# Patient Record
Sex: Male | Born: 1945 | Race: White | Hispanic: No | Smoking: Current every day smoker
Health system: Southern US, Community
[De-identification: ages and names within clinical notes are randomized; demographics above are authoritative.]

## PROBLEM LIST (undated history)

## (undated) DIAGNOSIS — T7840XA Allergy, unspecified, initial encounter: Secondary | ICD-10-CM

## (undated) DIAGNOSIS — Z720 Tobacco use: Secondary | ICD-10-CM

## (undated) DIAGNOSIS — F419 Anxiety disorder, unspecified: Secondary | ICD-10-CM

## (undated) DIAGNOSIS — F329 Major depressive disorder, single episode, unspecified: Secondary | ICD-10-CM

## (undated) DIAGNOSIS — J449 Chronic obstructive pulmonary disease, unspecified: Secondary | ICD-10-CM

## (undated) DIAGNOSIS — I10 Essential (primary) hypertension: Secondary | ICD-10-CM

## (undated) DIAGNOSIS — E119 Type 2 diabetes mellitus without complications: Secondary | ICD-10-CM

## (undated) DIAGNOSIS — I729 Aneurysm of unspecified site: Secondary | ICD-10-CM

## (undated) DIAGNOSIS — F32A Depression, unspecified: Secondary | ICD-10-CM

## (undated) DIAGNOSIS — E785 Hyperlipidemia, unspecified: Secondary | ICD-10-CM

## (undated) HISTORY — DX: Allergy, unspecified, initial encounter: T78.40XA

## (undated) HISTORY — DX: Hyperlipidemia, unspecified: E78.5

## (undated) HISTORY — DX: Anxiety disorder, unspecified: F41.9

## (undated) HISTORY — PX: OTHER SURGICAL HISTORY: SHX169

## (undated) HISTORY — PX: LUMBAR LAMINECTOMY: SHX95

## (undated) HISTORY — DX: Tobacco use: Z72.0

## (undated) HISTORY — DX: Chronic obstructive pulmonary disease, unspecified: J44.9

## (undated) HISTORY — DX: Depression, unspecified: F32.A

## (undated) HISTORY — PX: FOOT SURGERY: SHX648

## (undated) HISTORY — DX: Essential (primary) hypertension: I10

## (undated) HISTORY — DX: Aneurysm of unspecified site: I72.9

## (undated) HISTORY — DX: Major depressive disorder, single episode, unspecified: F32.9

## (undated) HISTORY — PX: LUMBAR DISC SURGERY: SHX700

## (undated) HISTORY — DX: Type 2 diabetes mellitus without complications: E11.9

## (undated) HISTORY — PX: COLONOSCOPY: SHX174

---

## 1997-12-14 ENCOUNTER — Encounter: Admission: RE | Admit: 1997-12-14 | Discharge: 1998-03-14 | Payer: Self-pay | Admitting: Family Medicine

## 1997-12-22 ENCOUNTER — Ambulatory Visit (HOSPITAL_COMMUNITY): Admission: RE | Admit: 1997-12-22 | Discharge: 1997-12-22 | Payer: Self-pay | Admitting: Family Medicine

## 1998-02-09 ENCOUNTER — Ambulatory Visit (HOSPITAL_COMMUNITY): Admission: RE | Admit: 1998-02-09 | Discharge: 1998-02-09 | Payer: Self-pay | Admitting: Neurosurgery

## 2002-10-19 ENCOUNTER — Encounter: Payer: Self-pay | Admitting: Family Medicine

## 2002-10-19 ENCOUNTER — Encounter: Admission: RE | Admit: 2002-10-19 | Discharge: 2002-10-19 | Payer: Self-pay | Admitting: Family Medicine

## 2004-01-24 ENCOUNTER — Encounter: Admission: RE | Admit: 2004-01-24 | Discharge: 2004-01-24 | Payer: Self-pay | Admitting: Family Medicine

## 2004-02-07 ENCOUNTER — Ambulatory Visit (HOSPITAL_COMMUNITY): Admission: RE | Admit: 2004-02-07 | Discharge: 2004-02-07 | Payer: Self-pay | Admitting: Cardiology

## 2004-11-07 ENCOUNTER — Ambulatory Visit: Payer: Self-pay | Admitting: Cardiology

## 2004-11-08 ENCOUNTER — Ambulatory Visit: Payer: Self-pay | Admitting: Cardiology

## 2005-01-15 ENCOUNTER — Ambulatory Visit: Payer: Self-pay | Admitting: Cardiology

## 2005-06-04 ENCOUNTER — Ambulatory Visit: Payer: Self-pay | Admitting: Cardiology

## 2005-10-23 ENCOUNTER — Ambulatory Visit: Payer: Self-pay | Admitting: Family Medicine

## 2005-11-18 ENCOUNTER — Ambulatory Visit: Payer: Self-pay | Admitting: Cardiology

## 2005-11-20 ENCOUNTER — Ambulatory Visit: Payer: Self-pay | Admitting: Cardiology

## 2005-12-15 ENCOUNTER — Ambulatory Visit: Payer: Self-pay | Admitting: Family Medicine

## 2005-12-23 ENCOUNTER — Ambulatory Visit: Payer: Self-pay | Admitting: Family Medicine

## 2006-11-02 ENCOUNTER — Ambulatory Visit: Payer: Self-pay | Admitting: Cardiology

## 2006-11-23 ENCOUNTER — Ambulatory Visit: Payer: Self-pay | Admitting: Family Medicine

## 2007-12-09 ENCOUNTER — Ambulatory Visit: Payer: Self-pay | Admitting: Family Medicine

## 2007-12-09 DIAGNOSIS — J309 Allergic rhinitis, unspecified: Secondary | ICD-10-CM | POA: Insufficient documentation

## 2007-12-09 DIAGNOSIS — E7800 Pure hypercholesterolemia, unspecified: Secondary | ICD-10-CM | POA: Insufficient documentation

## 2007-12-09 DIAGNOSIS — I10 Essential (primary) hypertension: Secondary | ICD-10-CM | POA: Insufficient documentation

## 2008-01-12 ENCOUNTER — Ambulatory Visit: Payer: Self-pay | Admitting: Family Medicine

## 2008-01-12 LAB — CONVERTED CEMR LAB
AST: 20 units/L (ref 0–37)
Basophils Absolute: 0 10*3/uL (ref 0.0–0.1)
Basophils Relative: 0.3 % (ref 0.0–1.0)
Bilirubin Urine: NEGATIVE
Chloride: 103 meq/L (ref 96–112)
Cholesterol: 181 mg/dL (ref 0–200)
Creatinine, Ser: 1 mg/dL (ref 0.4–1.5)
Eosinophils Absolute: 0.1 10*3/uL (ref 0.0–0.7)
GFR calc Af Amer: 98 mL/min
GFR calc non Af Amer: 81 mL/min
Glucose, Urine, Semiquant: NEGATIVE
HCT: 44.2 % (ref 39.0–52.0)
HDL: 46.7 mg/dL (ref >39.0)
MCHC: 34.8 g/dL (ref 30.0–36.0)
MCV: 97.4 fL (ref 78.0–100.0)
Monocytes Absolute: 0.6 10*3/uL (ref 0.1–1.0)
Neutrophils Relative %: 50.5 % (ref 43.0–77.0)
PSA: 0.55 ng/mL (ref 0.10–4.00)
Platelets: 175 10*3/uL (ref 150–400)
Protein, U semiquant: NEGATIVE
RBC: 4.54 M/uL (ref 4.22–5.81)
TSH: 0.58 microintl units/mL (ref 0.35–5.50)
Total Bilirubin: 1.2 mg/dL (ref 0.3–1.2)
VLDL: 39 mg/dL (ref 0–40)
pH: 5

## 2008-01-19 ENCOUNTER — Ambulatory Visit: Payer: Self-pay | Admitting: Family Medicine

## 2008-01-19 ENCOUNTER — Telehealth: Payer: Self-pay | Admitting: Family Medicine

## 2008-01-19 DIAGNOSIS — F17200 Nicotine dependence, unspecified, uncomplicated: Secondary | ICD-10-CM | POA: Insufficient documentation

## 2008-01-19 DIAGNOSIS — F411 Generalized anxiety disorder: Secondary | ICD-10-CM | POA: Insufficient documentation

## 2008-01-19 DIAGNOSIS — F418 Other specified anxiety disorders: Secondary | ICD-10-CM | POA: Insufficient documentation

## 2008-04-26 ENCOUNTER — Telehealth: Payer: Self-pay | Admitting: Family Medicine

## 2008-05-18 ENCOUNTER — Telehealth: Payer: Self-pay | Admitting: Family Medicine

## 2008-12-06 ENCOUNTER — Telehealth: Payer: Self-pay | Admitting: Family Medicine

## 2009-02-26 ENCOUNTER — Telehealth: Payer: Self-pay | Admitting: Family Medicine

## 2009-04-02 ENCOUNTER — Ambulatory Visit: Payer: Self-pay | Admitting: Family Medicine

## 2009-04-02 ENCOUNTER — Telehealth: Payer: Self-pay | Admitting: Family Medicine

## 2009-04-02 LAB — CONVERTED CEMR LAB
AST: 26 units/L (ref 0–37)
Albumin: 4.2 g/dL (ref 3.5–5.2)
Alkaline Phosphatase: 49 units/L (ref 39–117)
BUN: 16 mg/dL (ref 6–23)
Basophils Absolute: 0 10*3/uL (ref 0.0–0.1)
CO2: 34 meq/L — ABNORMAL HIGH (ref 19–32)
Calcium: 9.4 mg/dL (ref 8.4–10.5)
Cholesterol: 198 mg/dL (ref 0–200)
Creatinine, Ser: 0.9 mg/dL (ref 0.4–1.5)
Direct LDL: 105.8 mg/dL
Eosinophils Absolute: 0.1 10*3/uL (ref 0.0–0.7)
GFR calc non Af Amer: 90.61 mL/min (ref >60)
Glucose, Bld: 98 mg/dL (ref 70–99)
HDL: 54.1 mg/dL (ref >39.00)
Lymphocytes Relative: 26.8 % (ref 12.0–46.0)
MCHC: 34.5 g/dL (ref 30.0–36.0)
Monocytes Relative: 8.7 % (ref 3.0–12.0)
Neutrophils Relative %: 63.1 % (ref 43.0–77.0)
Platelets: 175 10*3/uL (ref 150.0–400.0)
RDW: 12.8 % (ref 11.5–14.6)
Sodium: 142 meq/L (ref 135–145)
TSH: 0.49 microintl units/mL (ref 0.35–5.50)
Total Bilirubin: 0.9 mg/dL (ref 0.3–1.2)
Triglycerides: 266 mg/dL — ABNORMAL HIGH (ref 0.0–149.0)

## 2009-04-19 ENCOUNTER — Ambulatory Visit: Payer: Self-pay | Admitting: Family Medicine

## 2009-04-26 ENCOUNTER — Ambulatory Visit: Payer: Self-pay | Admitting: Family Medicine

## 2009-04-30 ENCOUNTER — Ambulatory Visit: Payer: Self-pay | Admitting: Internal Medicine

## 2009-05-22 ENCOUNTER — Telehealth: Payer: Self-pay | Admitting: Family Medicine

## 2009-08-27 ENCOUNTER — Telehealth: Payer: Self-pay | Admitting: Family Medicine

## 2009-10-08 ENCOUNTER — Telehealth: Payer: Self-pay | Admitting: Family Medicine

## 2009-10-12 ENCOUNTER — Telehealth: Payer: Self-pay | Admitting: Family Medicine

## 2010-02-25 ENCOUNTER — Telehealth: Payer: Self-pay | Admitting: Family Medicine

## 2010-04-22 ENCOUNTER — Ambulatory Visit: Payer: Self-pay | Admitting: Family Medicine

## 2010-04-22 ENCOUNTER — Encounter: Payer: Self-pay | Admitting: Family Medicine

## 2010-04-22 DIAGNOSIS — J449 Chronic obstructive pulmonary disease, unspecified: Secondary | ICD-10-CM | POA: Insufficient documentation

## 2010-04-22 LAB — CONVERTED CEMR LAB
AST: 27 units/L (ref 0–37)
Albumin: 4.4 g/dL (ref 3.5–5.2)
BUN: 16 mg/dL (ref 6–23)
Basophils Absolute: 0 10*3/uL (ref 0.0–0.1)
Bilirubin Urine: NEGATIVE
Blood in Urine, dipstick: NEGATIVE
CO2: 33 meq/L — ABNORMAL HIGH (ref 19–32)
Cholesterol: 191 mg/dL (ref 0–200)
Eosinophils Absolute: 0.1 10*3/uL (ref 0.0–0.7)
Glucose, Bld: 142 mg/dL — ABNORMAL HIGH (ref 70–99)
Glucose, Urine, Semiquant: NEGATIVE
HCT: 46.4 % (ref 39.0–52.0)
Hemoglobin: 15.8 g/dL (ref 13.0–17.0)
Lymphs Abs: 2.6 10*3/uL (ref 0.7–4.0)
MCHC: 34.2 g/dL (ref 30.0–36.0)
MCV: 99.8 fL (ref 78.0–100.0)
Monocytes Absolute: 0.7 10*3/uL (ref 0.1–1.0)
Neutro Abs: 4.2 10*3/uL (ref 1.4–7.7)
PSA: 0.63 ng/mL (ref 0.10–4.00)
Platelets: 180 10*3/uL (ref 150.0–400.0)
Potassium: 4.7 meq/L (ref 3.5–5.1)
Protein, U semiquant: NEGATIVE
RDW: 13.3 % (ref 11.5–14.6)
Sodium: 142 meq/L (ref 135–145)
Specific Gravity, Urine: 1.02
TSH: 0.6 microintl units/mL (ref 0.35–5.50)
Total Bilirubin: 0.9 mg/dL (ref 0.3–1.2)
VLDL: 36.2 mg/dL (ref 0.0–40.0)
WBC Urine, dipstick: NEGATIVE
pH: 5

## 2010-08-06 NOTE — Miscellaneous (Signed)
Summary: Controlled Substances Contract  Controlled Substances Contract   Imported By: Maryln Gottron 04/23/2010 14:34:18  _____________________________________________________________________  External Attachment:    Type:   Image     Comment:   External Document

## 2010-08-06 NOTE — Progress Notes (Signed)
Summary: REQ FOR REFILL  Phone Note Call from Patient   Caller: Patient [phone] Reason for Call: Refill Medication Summary of Call: Pt called in to req a [facility]ill of [facility]cod[facility] [address] Pt would also like to see if dosage could be increased to twice a day, pt adv that he has been taking  2 instead of one when he gets up in the am because he can hardly get out of bed and it seems to be working well...Marland KitchenMarland KitchenPt was adv that he may need to come in for an OV for dosage increase but req would be passed along.  Initial call taken by: Debbra Riding,  August 27, 2009 9:11 AM  Follow-up for Phone Call        if his pain is getting worse.  We need to have him go see the neurosurgeon for evaluation.  Okay to prescribe 60 Vicodin tabs directions one b.i.d. no refills.  If he has a Midwife.  He can call himself if not, we will set him up an appointment Follow-up by: Roderick Pee MD,  August 27, 2009 9:51 AM  Additional Follow-up for Phone Call Additional follow up Details #1::        Phone Call Completed, Rx Called In Additional Follow-up by: Kern Reap CMA Duncan Dull),  August 27, 2009 10:01 AM    New/Updated Medications: VICODIN ES 7.5-750 MG TABS (HYDROCODONE-ACETAMINOPHEN) Take 1 tablet by mouth two times a day Prescriptions: VICODIN ES 7.5-750 MG TABS (HYDROCODONE-ACETAMINOPHEN) Take 1 tablet by mouth two times a day  #60 x 0   Entered by:   Kern Reap CMA (AAMA)   Authorized by:   Roderick Pee MD   Signed by:   Kern Reap CMA (AAMA) on 08/27/2009   Method used:   Telephoned to ...       [facility] [address] [facility]retail[facility]       [address]       [place], [place]  [address]       Ph: [phone] or [phone]       Fax: [phone]   RxID:   [id number]

## 2010-08-06 NOTE — Progress Notes (Signed)
Summary: diazepam  Phone Note From Pharmacy   Summary of Call: patient is requesting a refill of diazepam is this okay to fill? Initial call taken by: Kern Reap CMA Duncan Dull),  October 12, 2009 4:23 PM  Follow-up for Phone Call        Valium 5 mg, dispense 60 tablets, directions one p.o. b.i.d. p.r.n. refills x 4 Follow-up by: Roderick Pee MD,  October 12, 2009 4:38 PM  Additional Follow-up for Phone Call Additional follow up Details #1::        Pharmacist called Additional Follow-up by: Kern Reap CMA Duncan Dull),  October 12, 2009 5:21 PM

## 2010-08-06 NOTE — Assessment & Plan Note (Signed)
Summary: CPX (PT WILL COME IN FASTING) // RS   Vital Signs:  Patient profile:   65 year old male Height:      74 inches Weight:      252 pounds BMI:     32.47 Temp:     98.6 degrees F oral BP sitting:   140 / 88  (left arm) Cuff size:   regular  Vitals Entered By: Kern Reap CMA Duncan Dull) (April 22, 2010 8:38 AM)  Contraindications/Deferment of Procedures/Staging:    Test/Procedure: FLU VAX    Reason for deferment: patient declined  CC: cpx Is Patient Diabetic? No Pain Assessment Patient in pain? no        CC:  cpx.  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  For hyperlipidemia.  He takes simvastatin 40 mg daily will check lipid panel today.  For hypertension.  He takes Zestoretic 20 -- 25 daily.  BP 140/88.  For depression.  He takes Lexapro 20 mg nightly.  He also takes an aspirin tablet and 5 mg of Valium p.r.n.  He takes Vicodin one half tab b.i.d. daily for chronic back pain.  He also continues to smoke.  He took the chantix for a month and then quit.  I insisted he start back on the chantix again.  He does not get his eyes checked routinely however, he does get regular dental care.  He is due for follow-up colonoscopy, but refuses to go back because this because of the cost $1300 out-of-pocket.  He declines the flu shot tetanus visit 2006  Allergies: No Known Drug Allergies  Past History:  Past medical, surgical, family and social histories (including risk factors) reviewed, and no changes noted (except as noted below).  Past Medical History: Reviewed history from 01/19/2008 and no changes required. 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 Family History Depression Family History Hypertension  Social History: Reviewed history from 12/09/2007 and no changes required. Occupation: Single Current Smoker Alcohol use-yes Drug use-no Regular exercise-yes  Review of Systems      See HPI  Physical Exam  General:  Well-developed,well-nourished,in no acute distress; alert,appropriate and cooperative throughout examination Head:  Normocephalic and atraumatic without obvious abnormalities. No apparent alopecia or balding. Eyes:  No corneal or conjunctival inflammation noted. EOMI. Perrla. Funduscopic exam benign, without hemorrhages, exudates or papilledema. Vision grossly normal. Ears:  External ear exam shows no significant lesions or deformities.  Otoscopic examination reveals clear canals, tympanic membranes are intact bilaterally without bulging, retraction, inflammation or discharge. Hearing is grossly normal bilaterally. Nose:  External nasal examination shows no deformity or inflammation. Nasal mucosa are pink and moist without lesions or exudates. Mouth:  Oral mucosa and oropharynx without lesions or exudates.  Teeth in good repair. Neck:  No deformities, masses, or tenderness noted. Chest Wall:  No deformities, masses, tenderness or gynecomastia noted. Breasts:  No masses or gynecomastia noted Lungs:  Normal respiratory effort, chest expands symmetrically. Lungs are clear to auscultation, no crackles or wheezes. Heart:  heart sounds barely audible.  No murmurs Abdomen:  Bowel sounds positive,abdomen soft and non-tender  without masses, organomegaly or hernias noted. Rectal:  No external abnormalities noted. Normal sphincter tone. No rectal masses or tenderness. Genitalia:  Testes bilaterally descended without nodularity, tenderness or masses. No scrotal masses  or lesions. No penis lesions or urethral discharge. Prostate:  Prostate gland firm and smooth, no enlargement, nodularity, tenderness, mass, asymmetry or induration. Msk:  No deformity or scoliosis noted of thoracic or lumbar spine.   Pulses:  R and L carotid,radial,femoral,dorsalis pedis and posterior tibial pulses are full and equal bilaterally Extremities:  No clubbing, cyanosis, edema, or deformity noted with normal full range of motion of all joints.   Neurologic:  No cranial nerve deficits noted. Station and gait are normal. Plantar reflexes are down-going bilaterally. DTRs are symmetrical throughout. Sensory, motor and coordinative functions appear intact. Skin:  Intact without suspicious lesions or rashes Cervical Nodes:  No lymphadenopathy noted Axillary Nodes:  No palpable lymphadenopathy Inguinal Nodes:  No significant adenopathy Psych:  Cognition and judgment appear intact. Alert and cooperative with normal attention span and concentration. No apparent delusions, illusions, hallucinations   Impression & Recommendations:  Problem # 1:  TOBACCO ABUSE (ICD-305.1) Assessment Unchanged  The following medications were removed from the medication list:    Chantix Starting Month Pak 0.5 Mg X 11 & 1 Mg X 42 Tabs (Varenicline tartrate) ..... Uad His updated medication list for this problem includes:    Chantix Continuing Month Pak 1 Mg Tabs (Varenicline tartrate) ..... Uad  Orders: Venipuncture (60454) TLB-Lipid Panel (80061-LIPID) TLB-BMP (Basic Metabolic Panel-BMET) (80048-METABOL) TLB-CBC Platelet - w/Differential (85025-CBCD) TLB-Hepatic/Liver Function Pnl (80076-HEPATIC) TLB-TSH (Thyroid Stimulating Hormone) (84443-TSH) TLB-PSA (Prostate Specific Antigen) (84153-PSA) Prescription Created Electronically [phone]) UA Dipstick w/o Micro (automated)  (81003) Spirometry w/Graph (94010)  Problem # 2:  DEPRESSION (ICD-311) Assessment: Improved  His updated medication list for this  problem includes:    Lexapro 20 Mg Tabs (Escitalopram oxalate) .Marland Kitchen... 1 tab @ bedtime    Valium 5 Mg Tabs (Diazepam) .Marland Kitchen... 1 po bid .  Orders: Venipuncture (91478) TLB-Lipid Panel (80061-LIPID) TLB-BMP (Basic Metabolic Panel-BMET) (80048-METABOL) TLB-CBC Platelet - w/Differential (85025-CBCD) TLB-Hepatic/Liver Function Pnl (80076-HEPATIC) TLB-TSH (Thyroid Stimulating Hormone) (84443-TSH) TLB-PSA (Prostate Specific Antigen) (84153-PSA) Prescription Created Electronically [phone]) UA Dipstick w/o Micro (automated)  (81003)  Problem # 3:  HYPERTENSION (ICD-401.9) Assessment: Improved  His updated medication list for this problem includes:    Lisinopril-hydrochlorothiazide 20-25 Mg Tabs (Lisinopril-hydrochlorothiazide) .Marland Kitchen... Take 1 tablet by mouth once qam; cpx when due  Orders: Venipuncture (13086) TLB-Lipid Panel (80061-LIPID) TLB-BMP (Basic Metabolic Panel-BMET) (80048-METABOL) TLB-CBC Platelet - w/Differential (85025-CBCD) TLB-Hepatic/Liver Function Pnl (80076-HEPATIC) TLB-TSH (Thyroid Stimulating Hormone) (84443-TSH) TLB-PSA (Prostate Specific Antigen) (84153-PSA) Prescription Created Electronically [phone]) UA Dipstick w/o Micro (automated)  (81003) EKG w/ Interpretation (93000)  Problem # 4:  HYPERLIPIDEMIA (ICD-272.4) Assessment: Improved  His updated medication list for this problem includes:    Simvastatin 40 Mg Tabs (Simvastatin) .Marland Kitchen... Take 1 tab by mouth at bedtime  cpx when due  Orders: Venipuncture (96295) TLB-Lipid Panel (80061-LIPID) TLB-BMP (Basic Metabolic Panel-BMET) (80048-METABOL) TLB-CBC Platelet - w/Differential (85025-CBCD) TLB-Hepatic/Liver Function Pnl (80076-HEPATIC) TLB-TSH (Thyroid Stimulating Hormone) (84443-TSH) TLB-PSA (Prostate Specific Antigen) (84153-PSA) Prescription Created Electronically [phone]) UA Dipstick w/o Micro (automated)  (81003) EKG w/ Interpretation (93000)  Problem # 5:  COPD (ICD-496) Assessment:  Deteriorated  Orders: Venipuncture (24401) TLB-Lipid Panel (80061-LIPID) TLB-BMP (Basic Metabolic Panel-BMET) (80048-METABOL) TLB-CBC Platelet - w/Differential (85025-CBCD) TLB-Hepatic/Liver Function Pnl (80076-HEPATIC) TLB-TSH (Thyroid Stimulating Hormone) (84443-TSH) TLB-PSA (Prostate Specific Antigen) (84153-PSA) Prescription Created Electronically [phone]) UA Dipstick w/o Micro (automated)  (  81003) Spirometry w/Graph (94010)  Problem # 6:  Preventive Health Care (ICD-V70.0) Assessment: Unchanged  Complete Medication List: 1)  Simvastatin 40 Mg Tabs (Simvastatin) .... Take 1 tab by mouth at bedtime  cpx when due 2)  Lisinopril-hydrochlorothiazide 20-25 Mg Tabs (Lisinopril-hydrochlorothiazide) .... Take 1 tablet by mouth once qam; cpx when due 3)  Lexapro 20 Mg Tabs (Escitalopram oxalate) .Marland Kitchen.. 1 tab @ bedtime 4)  Adult Aspirin Low Strength 81 Mg Tbdp (Aspirin) .... Once daily 5)  Valium 5 Mg Tabs (Diazepam) .Marland Kitchen.. 1 po bid . 6)  Flexeril 10 Mg Tabs (Cyclobenzaprine hcl) .... Take 1 tablet by mouth three times a day 7)  Vicodin Es 7.5-750 Mg Tabs (Hydrocodone-acetaminophen) .... Take 1 tablet by mouth two times a day 8)  Chantix Continuing Month Pak 1 Mg Tabs (Varenicline tartrate) .... Uad  Patient Instructions: 1)  he must stop smoking completely began the chantix one half tablet daily and begin to taper the cigarettes by two week and to stop completely. 2)  Follow-up in 6 weeks. 3)  Continue her other medications. 4)  Dr. Gweneth Dimitri, I would recommend for an eye exam. 5)  We will also have he signed a pain contract since it appears to be taking the pain medicine on a regular basis. Prescriptions: VALIUM 5 MG TABS (DIAZEPAM) 1 po bid .  #60 x 3   Entered and Authorized by:   Roderick Pee MD   Signed by:   Roderick Pee MD on 04/22/2010   Method used:   Print then Give to Patient   RxID:   [id number] VICODIN ES 7.5-750 MG TABS (HYDROCODONE-ACETAMINOPHEN) Take 1 tablet by  mouth two times a day  #120 x 6   Entered and Authorized by:   Roderick Pee MD   Signed by:   Roderick Pee MD on 04/22/2010   Method used:   Print then Give to Patient   RxID:   [id number] CHANTIX CONTINUING MONTH PAK 1 MG TABS (VARENICLINE TARTRATE) UAD  #1 x 3   Entered and Authorized by:   Roderick Pee MD   Signed by:   Roderick Pee MD on 04/22/2010   Method used:   [facility] to        [facility] [phone][facility] (retail[facility]       [address]       [place], [place]  [address]       Ph: [phone] or [phone]       Fax: [phone]   RxID:   [id number] LEXAPRO 20 MG  TABS (ESCITALOPRAM OXALATE) 1 tab @ bedtime  #100 Tablet x 3   Entered and Authorized by:   Roderick Pee MD   Signed by:   Roderick Pee MD on 04/22/2010   Method used:   Electronically to        [facility] [phone]* (retail)       [address]       [place], [place]  [address]       Ph: [phone] or [phone]       Fax: [phone]   RxID:   [id number] LISINOPRIL-HYDROCHLOROTHIAZIDE 20-25 MG  TABS (LISINOPRIL-HYDROCHLOROTHIAZIDE) Take 1 tablet by mouth once qam; cpx when due  #100 x 3   Entered and Authorized by:   Roderick Pee MD   Signed by:   Roderick Pee MD on 04/22/2010   Method used:   [facility]ronically to  [facility] [phone][facility] (retail)       [address]       [place], [place]  [address]       Ph: [phone] or [phone]       Fax: [phone]   RxID:   [id number] SIMVASTATIN 40 MG  TABS (SIMVASTATIN) Take 1 tab by mouth at bedtime  cpx when due  #100 Tablet x 3   Entered and Authorized by:   Roderick Pee MD   Signed by:   Roderick Pee MD on 04/22/2010   Method used:   Electronically to        [facility] [phone]* (retail)       [address]       [place], [place]  [address]       Ph: [phone] or [phone]       Fax: [phone]   RxID:   [id number]    Orders Added: 1)  Venipuncture [73710] 2)  TLB-Lipid Panel [80061-LIPID] 3)  TLB-BMP  (Basic Metabolic Panel-BMET) [80048-METABOL] 4)  TLB-CBC Platelet - w/Differential [85025-CBCD] 5)  TLB-Hepatic/Liver Function Pnl [80076-HEPATIC] 6)  TLB-TSH (Thyroid Stimulating Hormone) [84443-TSH] 7)  TLB-PSA (Prostate Specific Antigen) [62694-WNI] 8)  Prescription Created Electronically [G8553] 9)  Est. Patient 40-64 years [99396] 10)  UA Dipstick w/o Micro (automated)  [81003] 11)  EKG w/ Interpretation [93000] 12)  Spirometry w/Graph [94010]     Laboratory Results   Urine Tests    Routine Urinalysis   Color: yellow Appearance: Clear Glucose: negative   (Normal Range: Negative) Bilirubin: negative   (Normal Range: Negative) Ketone: negative   (Normal Range: Negative) Spec. Gravity: 1.020   (Normal Range: 1.003-1.035) Blood: negative   (Normal Range: Negative) pH: 5.0   (Normal Range: 5.0-8.0) Protein: negative   (Normal Range: Negative) Urobilinogen: 0.2   (Normal Range: 0-1) Nitrite: negative   (Normal Range: Negative) Leukocyte Esterace: negative   (Normal Range: Negative)    Comments: Drew Burke  April 22, 2010 11:25 AM

## 2010-08-06 NOTE — Progress Notes (Signed)
Summary: Med refill - fyi  Phone Note Call from Patient Call back at [facility] Phone [phone]   Caller: Patient Reason for Call: Talk to Nurse Summary of Call: Patient wants Hydrocodone renewed (30 per month).  Please call Initial call taken by: Everrett Coombe,  October 08, 2009 9:59 AM  Follow-up for Phone Call        he would also like a refill of generic flexeril.  tried to call patient to see if he saw a specialist but can not leave message at phone number Follow-up by: Kern Reap CMA Duncan Dull),  October 08, 2009 12:51 PM  Additional Follow-up for Phone Call Additional follow up Details #1::        Flexeril 5 mg, dispensed 30 tablets, directions one nightly, refills x 5 also Vicodin ES, number 30 directions one half to one tablet nightly, refills x 5 Additional Follow-up by: Roderick Pee MD,  October 08, 2009 1:49 PM    Additional Follow-up for Phone Call Additional follow up Details #2::    [facility] [address] Follow-up by: Kern Reap CMA Duncan Dull),  October 08, 2009 3:44 PM

## 2010-08-06 NOTE — Progress Notes (Signed)
Summary: refill diazepam and hydrocodone  Phone Note From Pharmacy   [facility] [phone][facility] Summary of Call: patient is requesting a refill of diazepam and hydrocodone is this okay to fill? Initial call taken by: Kern Reap CMA Duncan Dull),  February 25, 2010 12:11 PM  Follow-up for Phone Call        dispensed 30 of each refills x 2.  Patient to set up CPE  in October Follow-up by: Roderick Pee MD,  February 25, 2010 12:20 PM    Prescriptions: VALIUM 5 MG TABS (DIAZEPAM) 1 po bid .  #30 x 2   Entered by:   Kern Reap CMA (AAMA)   Authorized by:   Roderick Pee MD   Signed by:   Kern Reap CMA (AAMA) on 02/25/2010   Method used:   Historical   RxID:   [id number] VICODIN ES 7.5-750 MG TABS (HYDROCODONE-ACETAMINOPHEN) Take 1 tablet by mouth two times a day  #30 x 2   Entered by:   Kern Reap CMA (AAMA)   Authorized by:   Roderick Pee MD   Signed by:   Kern Reap CMA (AAMA) on 02/25/2010   Method used:   Historical   RxID:   [id number]

## 2010-08-14 ENCOUNTER — Other Ambulatory Visit: Payer: Self-pay | Admitting: Family Medicine

## 2010-08-14 DIAGNOSIS — F419 Anxiety disorder, unspecified: Secondary | ICD-10-CM

## 2010-08-14 MED ORDER — DIAZEPAM 5 MG PO TABS: 5.0000 mg | ORAL_TABLET | Freq: Two times a day (BID) | ORAL | Status: DC

## 2010-08-23 ENCOUNTER — Encounter: Payer: Self-pay | Admitting: Family Medicine

## 2010-08-23 ENCOUNTER — Ambulatory Visit (INDEPENDENT_AMBULATORY_CARE_PROVIDER_SITE_OTHER): Payer: BC Managed Care – PPO | Admitting: Family Medicine

## 2010-08-23 VITALS — BP 140/84 | Temp 99.1°F | Ht 73.0 in | Wt 254.0 lb

## 2010-08-23 DIAGNOSIS — F17200 Nicotine dependence, unspecified, uncomplicated: Secondary | ICD-10-CM

## 2010-08-23 DIAGNOSIS — R7301 Impaired fasting glucose: Secondary | ICD-10-CM

## 2010-08-23 DIAGNOSIS — E119 Type 2 diabetes mellitus without complications: Secondary | ICD-10-CM

## 2010-08-23 DIAGNOSIS — Z720 Tobacco use: Secondary | ICD-10-CM

## 2010-08-23 LAB — BASIC METABOLIC PANEL
CO2: 28 mEq/L (ref 19–32)
Calcium: 9.5 mg/dL (ref 8.4–10.5)
Creatinine, Ser: 0.9 mg/dL (ref 0.4–1.5)
Glucose, Bld: 144 mg/dL — ABNORMAL HIGH (ref 70–99)

## 2010-08-23 MED ORDER — VARENICLINE TARTRATE 1 MG PO TABS: 1.0000 mg | ORAL_TABLET | Freq: Every day | ORAL | Status: DC

## 2010-08-23 NOTE — Patient Instructions (Signed)
Continue the diet and exercise program.  I will call you the report on your blood sugar.  We start the chantix program by taking one half tablet daily, and tapering by one cigarette per week.  Over a 14 week period of time.  When she stop smoking completely then take the chantix a half a tablet daily for 4 to 6 months or more.  We will set you up in pulmonary for pulmonary function studies

## 2010-08-23 NOTE — Progress Notes (Signed)
  Subjective:    Patient ID: Drew Burke, male    DOB: 17-Jan-1946, 65 y.o.   MRN: [id number]  HPIWilliam  is a 65 year old male Smoker who comes in today for follow-up of tobacco abuse, hyperglycemia.  We put him on a smoking cessation program.  He took the Chantix  1 mg twice daily, but continued to smoke.  He would like to try again.Marland Kitchen  He is currently smoking 15 cigarettes per day.  He had mildly elevated blood sugar 142 on his last physical examination his brother is a type II diabetic.  He, states he's been dieting and exercise as lost 7 pounds in the past 3 weeks.  He continues to have chronic low back pain and has quit playing golf because of the severe ongoing pain.    Review of Systems    Review of systems other than above, negative Objective:   Physical Exam Well-developed well-nourished, male in no acute distress.  Examination along shows symmetrical, but decreased breath sounds consistent with some COPD from tobacco abuse       Assessment & Plan:  Elevated blood sugar,,,,,,, plan check fasting blood sugar and hemoglobin A1C  Tobacco abuse with physical evidence of COPD,,,,,,,,, check pulmonary function studies restart the chantix program

## 2010-08-27 MED ORDER — METFORMIN HCL 500 MG PO TABS: ORAL_TABLET | ORAL | Status: DC

## 2010-08-27 NOTE — Progress Notes (Signed)
Addended by: Kern Reap on: 08/27/2010 08:56 AM   Modules accepted: Orders

## 2010-08-28 ENCOUNTER — Encounter: Payer: Self-pay | Admitting: Internal Medicine

## 2010-08-28 ENCOUNTER — Encounter (INDEPENDENT_AMBULATORY_CARE_PROVIDER_SITE_OTHER): Payer: BC Managed Care – PPO

## 2010-08-28 DIAGNOSIS — J449 Chronic obstructive pulmonary disease, unspecified: Secondary | ICD-10-CM

## 2010-08-30 ENCOUNTER — Encounter: Payer: Self-pay | Admitting: Family Medicine

## 2010-09-12 NOTE — Assessment & Plan Note (Signed)
Summary: tobacco abuse - refer by dr Tawanna Cooler ///kp   Allergies: No Known Drug Allergies   Other Orders: Carbon Monoxide diffusing w/capacity (81191) Lung Volumes/Gas dilution or washout (47829) Spirometry (Pre & Post) (56213)

## 2010-10-31 ENCOUNTER — Other Ambulatory Visit (INDEPENDENT_AMBULATORY_CARE_PROVIDER_SITE_OTHER): Payer: BC Managed Care – PPO | Admitting: Family Medicine

## 2010-10-31 DIAGNOSIS — E119 Type 2 diabetes mellitus without complications: Secondary | ICD-10-CM

## 2010-11-05 ENCOUNTER — Telehealth: Payer: Self-pay | Admitting: *Deleted

## 2010-11-05 NOTE — Telephone Encounter (Signed)
Please call pt with lab results

## 2010-11-05 NOTE — Telephone Encounter (Signed)
ok 

## 2010-11-06 ENCOUNTER — Other Ambulatory Visit: Payer: Self-pay | Admitting: *Deleted

## 2010-11-06 DIAGNOSIS — F419 Anxiety disorder, unspecified: Secondary | ICD-10-CM

## 2010-11-06 MED ORDER — DIAZEPAM 5 MG PO TABS: 5.0000 mg | ORAL_TABLET | Freq: Two times a day (BID) | ORAL | Status: DC

## 2010-11-06 NOTE — Telephone Encounter (Signed)
patient  Is aware 

## 2010-11-13 ENCOUNTER — Telehealth: Payer: Self-pay | Admitting: *Deleted

## 2010-11-13 NOTE — Telephone Encounter (Signed)
[facility] refill for diazepam multiple times.  Please call in refill to pharmacy.

## 2010-11-13 NOTE — Telephone Encounter (Signed)
rx already sent.  Spoke with pharmacy and patient

## 2010-11-22 NOTE — Cardiovascular Report (Signed)
NAME:  Drew Burke, Drew Burke NO.:  [id number]   MEDICAL RECORD NO.:  [id number]                   PATIENT TYPE:  OIB   LOCATION:  2899                                 FACILITY:  [facility]   PHYSICIAN:  Drew Burke, M.D. [facility]         DATE OF BIRTH:  1945/08/08   DATE OF PROCEDURE:  02/08/2004  DATE OF DISCHARGE:                              CARDIAC CATHETERIZATION   INDICATIONS:  [name] 65 year old gentleman who underwent stress testing  in our [place] office because of a number of high-risk factors.  Based on  this, he had some ST depression.  He does smoke.  He also has a modest  family history of coronary artery disease.  Risks, benefits, and  alternatives were discussed with the patient in detail, and he is agreeable  to proceed.   PROCEDURES:  1. Left heart catheterization.  2. Selective coronary arteriography.  3. Selective left ventriculography.   CARDIAC ASSESSMENT:  Drew Burke, M.D.   DESCRIPTION OF PROCEDURE:  The procedure was performed from the right  femoral artery using 6-French catheters.  He tolerated the procedure well  without complications.  He was taken to the holding area in satisfactory  clinical condition.   HEMODYNAMIC DATA:  1. Central aortic pressure 104/64, mean 81.  2. Left ventricle 100/8.  3. No gradient on pullback across the aortic valve.   ANGIOGRAPHIC DATA:  1. Ventriculography was performed in the RAO projection.  Overall systolic     function was preserved.  Because of ventricular ectopy, exact ejection     fraction could not be calculated; however, the EF appeared to exceed 60%     luminal reduction.  2. Left main coronary was free of critical disease.  3. Left anterior descending artery is calcified proximally.  There is an     eccentric plaque of approximately 40 to at most 50% with residual luminal     diameter of probably just over 2 mm noted in the ostial region.  This is     best seen in the  LAO cranial view.  In other views, it is not as well     seen.  There is 40% mid narrowing as well with eccentric plaque noted.     The distal LAD is a relatively small vessel.  4. The circumflex consists of a large intermediate branch which wraps over     the lateral wall and is free of critical disease.  Likewise, the A-V     circumflex is without narrowing.  5. The right coronary artery has a proximal conus branch and a downward     takeoff.  The right coronary artery is a dominant vessel with a posterior     descending and moderately large posterolateral system.  The RCA is     without critical narrowing.   CONCLUSIONS:  1. Well preserved overall left ventricular function.  2. Moderate calcified plaque involving the proximal  left anterior descending     artery with mild/moderate disease involving the mid left anterior     descending artery.  3. Mildly abnormal exercise tolerance test.  4. Smoking history.  5. Mild hypercholesterolemia.   RECOMMENDATIONS:  1. Discontinuation of cigarette smoking.  2. I would add a statin with target LDL of 70.  3. I would recommend daily aspirin.  4. I would repeat the stress test with radionuclide imaging to exclude     severe ischemia of the left anterior descending distribution.  5. Probable anti-ischemic therapy.   DISPOSITION:  The patient will follow up with Dr. Riley Burke in the office.  He  will also see Dr. Alonza Smoker in chronic and continuing followup.                                               Drew Burke, M.D. [facility]    TDS/MEDQ  D:  02/07/2004  T:  02/08/2004  Job:  [id number]   cc:   [facility]   Tinnie Gens A. Tawanna Cooler, M.D. [facility]   Bruce H. Swords, M.D. [facility]

## 2010-11-22 NOTE — Procedures (Signed)
[facility]                              EXERCISE TREADMILL   Drew Burke, Drew Burke                     MRN:          [id number]  DATE:11/02/2006                            DOB:          1945-09-05    DURATION OF EXERCISE:  7 minutes and 47 seconds.   MAXIMUM HEART RATE:  125, PMHR is 75%.   COMMENTS:  [name] exercised today on the Bruce protocol. Exercise  tolerance was fair. He experienced no chest pain. There was a rare  premature ventricular contraction, no ST segment changes were noted. The  study was formerly nondiagnostic due to failure to achieve 85% of age  predicted maximum, but with near maximal effort and no evidence of  significant ST depression with full exercise.   The patient has previously undergone catheterization. He unfortunately  continues to smoke. 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 There is 40% narrowing in the mid vessel as  well. It involves calcified plaque. The patient has hyperlipidemia and  is on lipid-lowering therapy managed by Dr. Tawanna Cooler. I have strongly  encouraged him to not smoke.     Arturo Morton. Riley Kill, MD, [facility]  Electronically Signed    TDS/MedQ  DD: 11/02/2006  DT: 11/02/2006  Job #: [id number]   cc:   Tinnie Gens A. Tawanna Cooler, MD

## 2010-11-27 ENCOUNTER — Other Ambulatory Visit: Payer: Self-pay | Admitting: Family Medicine

## 2010-11-27 NOTE — Telephone Encounter (Signed)
Pt req refill of [facility]cod[facility] 7[facility] [address]. Pt said that pharmacy sent req 2 days ago.

## 2010-11-28 ENCOUNTER — Telehealth: Payer: Self-pay | Admitting: *Deleted

## 2010-11-28 MED ORDER — HYDROCODONE-ACETAMINOPHEN 7.5-750 MG PO TABS: 1.0000 | ORAL_TABLET | Freq: Two times a day (BID) | ORAL | Status: DC

## 2010-11-28 NOTE — Telephone Encounter (Signed)
Okay to refill medication for 6 months

## 2010-11-28 NOTE — Telephone Encounter (Signed)
rx called in and Left message on machine for patient  At home number

## 2010-11-28 NOTE — Telephone Encounter (Signed)
rx called in

## 2010-11-28 NOTE — Telephone Encounter (Signed)
Pt call back checking on statue of med phone note routed to rachel

## 2010-11-28 NOTE — Telephone Encounter (Signed)
Pt calling to check status of refill, advised it has not been called in yet and to allow time for nurse to call in

## 2010-11-30 ENCOUNTER — Other Ambulatory Visit: Payer: Self-pay | Admitting: Family Medicine

## 2011-03-26 ENCOUNTER — Other Ambulatory Visit: Payer: Self-pay | Admitting: Family Medicine

## 2011-04-17 ENCOUNTER — Other Ambulatory Visit (INDEPENDENT_AMBULATORY_CARE_PROVIDER_SITE_OTHER): Payer: BC Managed Care – PPO

## 2011-04-17 DIAGNOSIS — E119 Type 2 diabetes mellitus without complications: Secondary | ICD-10-CM

## 2011-04-17 LAB — CBC WITH DIFFERENTIAL/PLATELET
Basophils Absolute: 0 10*3/uL (ref 0.0–0.1)
Basophils Relative: 0.2 % (ref 0.0–3.0)
Hemoglobin: 15.3 g/dL (ref 13.0–17.0)
Lymphocytes Relative: 40.3 % (ref 12.0–46.0)
Monocytes Relative: 9.4 % (ref 3.0–12.0)
Neutro Abs: 3.9 10*3/uL (ref 1.4–7.7)
RBC: 4.7 Mil/uL (ref 4.22–5.81)
RDW: 13.4 % (ref 11.5–14.6)
WBC: 8 10*3/uL (ref 4.5–10.5)

## 2011-04-17 LAB — LIPID PANEL
HDL: 47.2 mg/dL (ref >39.00)
VLDL: 26.4 mg/dL (ref 0.0–40.0)

## 2011-04-17 LAB — MICROALBUMIN / CREATININE URINE RATIO: Creatinine,U: 114.9 mg/dL

## 2011-04-17 LAB — BASIC METABOLIC PANEL
Calcium: 9.6 mg/dL (ref 8.4–10.5)
GFR: 97.49 mL/min (ref >60.00)
Sodium: 140 mEq/L (ref 135–145)

## 2011-04-17 LAB — TSH: TSH: 0.96 u[IU]/mL (ref 0.35–5.50)

## 2011-04-22 ENCOUNTER — Other Ambulatory Visit: Payer: Self-pay | Admitting: Family Medicine

## 2011-04-22 NOTE — Telephone Encounter (Signed)
11/28/10 last filled

## 2011-04-22 NOTE — Telephone Encounter (Signed)
Fleet Contras please research, and when we gave him his prescription last

## 2011-04-22 NOTE — Telephone Encounter (Signed)
Okay to write enough medication to last until his next physical

## 2011-04-22 NOTE — Telephone Encounter (Signed)
Pt need refill on generic vicodin 7.5-750mg   [facility]arm [facility] [phone]

## 2011-04-23 MED ORDER — HYDROCODONE-ACETAMINOPHEN 7.5-750 MG PO TABS: 1.0000 | ORAL_TABLET | Freq: Two times a day (BID) | ORAL | Status: DC

## 2011-04-24 ENCOUNTER — Encounter: Payer: Self-pay | Admitting: Family Medicine

## 2011-04-24 ENCOUNTER — Ambulatory Visit (INDEPENDENT_AMBULATORY_CARE_PROVIDER_SITE_OTHER): Payer: BC Managed Care – PPO | Admitting: Family Medicine

## 2011-04-24 DIAGNOSIS — E785 Hyperlipidemia, unspecified: Secondary | ICD-10-CM

## 2011-04-24 DIAGNOSIS — F419 Anxiety disorder, unspecified: Secondary | ICD-10-CM

## 2011-04-24 DIAGNOSIS — M549 Dorsalgia, unspecified: Secondary | ICD-10-CM

## 2011-04-24 DIAGNOSIS — J449 Chronic obstructive pulmonary disease, unspecified: Secondary | ICD-10-CM

## 2011-04-24 DIAGNOSIS — Z23 Encounter for immunization: Secondary | ICD-10-CM

## 2011-04-24 DIAGNOSIS — F411 Generalized anxiety disorder: Secondary | ICD-10-CM

## 2011-04-24 DIAGNOSIS — I10 Essential (primary) hypertension: Secondary | ICD-10-CM

## 2011-04-24 DIAGNOSIS — F17200 Nicotine dependence, unspecified, uncomplicated: Secondary | ICD-10-CM

## 2011-04-24 DIAGNOSIS — J4489 Other specified chronic obstructive pulmonary disease: Secondary | ICD-10-CM

## 2011-04-24 DIAGNOSIS — F329 Major depressive disorder, single episode, unspecified: Secondary | ICD-10-CM

## 2011-04-24 DIAGNOSIS — E119 Type 2 diabetes mellitus without complications: Secondary | ICD-10-CM

## 2011-04-24 DIAGNOSIS — F3289 Other specified depressive episodes: Secondary | ICD-10-CM

## 2011-04-24 MED ORDER — DIAZEPAM 5 MG PO TABS: 5.0000 mg | ORAL_TABLET | Freq: Two times a day (BID) | ORAL | Status: DC

## 2011-04-24 MED ORDER — ESCITALOPRAM OXALATE 20 MG PO TABS: 20.0000 mg | ORAL_TABLET | Freq: Every day | ORAL | Status: DC

## 2011-04-24 MED ORDER — SIMVASTATIN 40 MG PO TABS: 40.0000 mg | ORAL_TABLET | Freq: Every day | ORAL | Status: DC

## 2011-04-24 MED ORDER — METFORMIN HCL 500 MG PO TABS: ORAL_TABLET | ORAL | Status: DC

## 2011-04-24 MED ORDER — LISINOPRIL-HYDROCHLOROTHIAZIDE 20-25 MG PO TABS: 1.0000 | ORAL_TABLET | Freq: Every day | ORAL | Status: DC

## 2011-04-24 MED ORDER — CYCLOBENZAPRINE HCL 10 MG PO TABS: 10.0000 mg | ORAL_TABLET | ORAL | Status: DC

## 2011-04-24 NOTE — Patient Instructions (Signed)
Increase the metformin to one tablet daily, check a fasting blood sugar daily.  Return in 3 months fasting labs one week prior.  We will also do to set up for a pulmonary function studies in pulmonary see me two weeks after the study for follow-up.  Restart the chantix one half tab daily and begin to taper your cigarettes

## 2011-04-24 NOTE — Progress Notes (Signed)
Subjective:    Patient ID: Drew Burke, male    DOB: 1946/04/20, 65 y.o.   MRN: [id number]  HPI Drew Burke is a 3 year old single male......... He has a live-in girlfriend, who has an M.D., from [place], who is disabled......... Smoker ........ Stop drinking alcohol, February 2012.......... Who comes in today for evaluation of hypertension, diabetes, hyperlipidemia, depression, anxiety, and tobacco abuse.  His medications were reviewed in detail, and a been no changes.  He takes a half of a metal foreman in the morning.  However, A1c is now up to 7.7%.  He is following his diet.  He walks 45 minutes.  A day.  Therefore, will increase the metformin to a full tablet daily.  Other problems remain stable except the situation with his live-in girlfriend.  The bottom line is the live-in girlfriend needs to go   Review of Systems  Constitutional: Negative.   HENT: Negative.   Eyes: Negative.   Respiratory: Negative.   Cardiovascular: Negative.   Gastrointestinal: Negative.   Genitourinary: Negative.   Musculoskeletal: Negative.   Skin: Negative.   Neurological: Negative.   Hematological: Negative.   Psychiatric/Behavioral: Negative.        Objective:   Physical Exam  Constitutional: He is oriented to person, place, and time. He appears well-developed and well-nourished.  HENT:  Head: Normocephalic and atraumatic.  Right Ear: External ear normal.  Left Ear: External ear normal.  Nose: Nose normal.  Mouth/Throat: Oropharynx is clear and moist.  Eyes: Conjunctivae and EOM are normal. Pupils are equal, round, and reactive to light.  Neck: Normal range of motion. Neck supple. No JVD present. No tracheal deviation present. No thyromegaly present.  Cardiovascular: Normal rate, regular rhythm and intact distal pulses.  Exam reveals no gallop and no friction rub.   No murmur heard.      Heart sounds distant because of underlying COPD  Pulmonary/Chest: Effort normal. No stridor. No  respiratory distress. He has no wheezes. He has no rales. He exhibits no tenderness.       Decreased breath sounds because of underlying COPD  Abdominal: Soft. Bowel sounds are normal. He exhibits no distension and no mass. There is no tenderness. There is no rebound and no guarding.  Genitourinary: Rectum normal, prostate normal and penis normal. Guaiac negative stool. No penile tenderness.  Musculoskeletal: Normal range of motion. He exhibits no edema and no tenderness.  Lymphadenopathy:    He has no cervical adenopathy.  Neurological: He is alert and oriented to person, place, and time. He has normal reflexes. No cranial nerve deficit. He exhibits normal muscle tone.  Skin: Skin is warm and dry. No rash noted. No erythema. No pallor.       Total body skin exam normal except for scar mid lumbar area from previous lumbar disk surgery....... He finally quit playing golf two years ago because of chronic back pain  Psychiatric: He has a normal mood and affect. His behavior is normal. Judgment and thought content normal.          Assessment & Plan:  Diabetes Lupita Leash goal increase metformin to 500 mg daily, fasting blood sugar daily.  Follow up A1c in 3 months.  Hyperlipidemia.  Continue Zocor 40 and an aspirin at that time.  Hypertension.  Continue Zestoretic 20 to 25 daily.  Chronic back pain, Flexeril, and Vicodin daily.  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  Continue Valium, but get rare girlfriend.  COPD continue smoker.  Plan restart the chantix one half  tab daily pulmonary evaluation.  Return after pulmonary evaluation for follow-up and to work on the smoking cessation program

## 2011-04-27 ENCOUNTER — Other Ambulatory Visit: Payer: Self-pay | Admitting: Family Medicine

## 2011-05-05 ENCOUNTER — Ambulatory Visit (INDEPENDENT_AMBULATORY_CARE_PROVIDER_SITE_OTHER): Payer: BC Managed Care – PPO | Admitting: Internal Medicine

## 2011-05-05 DIAGNOSIS — J449 Chronic obstructive pulmonary disease, unspecified: Secondary | ICD-10-CM

## 2011-05-05 LAB — PULMONARY FUNCTION TEST

## 2011-05-05 NOTE — Progress Notes (Signed)
PFT done today. 

## 2011-05-07 ENCOUNTER — Encounter: Payer: Self-pay | Admitting: Family Medicine

## 2011-05-07 ENCOUNTER — Other Ambulatory Visit: Payer: Self-pay | Admitting: Family Medicine

## 2011-05-07 DIAGNOSIS — F17200 Nicotine dependence, unspecified, uncomplicated: Secondary | ICD-10-CM

## 2011-05-07 DIAGNOSIS — J449 Chronic obstructive pulmonary disease, unspecified: Secondary | ICD-10-CM

## 2011-06-04 ENCOUNTER — Ambulatory Visit (INDEPENDENT_AMBULATORY_CARE_PROVIDER_SITE_OTHER): Payer: Self-pay | Admitting: Critical Care Medicine

## 2011-06-04 ENCOUNTER — Encounter: Payer: Self-pay | Admitting: Critical Care Medicine

## 2011-06-04 ENCOUNTER — Ambulatory Visit (INDEPENDENT_AMBULATORY_CARE_PROVIDER_SITE_OTHER)
Admission: RE | Admit: 2011-06-04 | Discharge: 2011-06-04 | Disposition: A | Discharge status: Home / Self Care | Payer: Medicare Other | Source: Ambulatory Visit | Attending: Critical Care Medicine | Admitting: Critical Care Medicine

## 2011-06-04 DIAGNOSIS — I10 Essential (primary) hypertension: Secondary | ICD-10-CM

## 2011-06-04 DIAGNOSIS — F17200 Nicotine dependence, unspecified, uncomplicated: Secondary | ICD-10-CM

## 2011-06-04 DIAGNOSIS — J449 Chronic obstructive pulmonary disease, unspecified: Secondary | ICD-10-CM

## 2011-06-04 MED ORDER — LOSARTAN POTASSIUM-HCTZ 100-25 MG PO TABS: 1.0000 | ORAL_TABLET | Freq: Every day | ORAL | Status: DC

## 2011-06-04 MED ORDER — NICOTINE 10 MG IN INHA: RESPIRATORY_TRACT | Status: AC

## 2011-06-04 NOTE — Progress Notes (Signed)
Subjective:    Patient ID: Drew Burke, male    DOB: [birth year], 65 y.o.   MRN: [id number]  HPI Comments: Walks 45 min a day up and down hills without symptoms. No qhs dyspnea.  HAs a daily cough at night at first and in am will cough up phlegm .  Dx with Copd for 2-3 years Difficult time with smoking  Cough This is a chronic problem. The current episode started more than 1 year ago. The problem has been unchanged. Episode frequency: coughs late PM , early AM main times. The cough is productive of purulent sputum. Associated symptoms include headaches, nasal congestion and wheezing. Pertinent negatives include no chest pain, chills, ear congestion, ear pain, fever, heartburn, hemoptysis, myalgias, postnasal drip, rash, rhinorrhea, sore throat, shortness of breath, sweats or weight loss. Associated symptoms comments: occ AM headache. The symptoms are aggravated by lying down. Risk factors for lung disease include smoking/tobacco exposure. He has tried nothing for the symptoms. His past medical history is significant for bronchitis and COPD. There is no history of asthma, bronchiectasis, emphysema, environmental allergies or pneumonia.   Refer for abn PFT.     Review of Systems  Constitutional: Negative for fever, chills, weight loss, diaphoresis, activity change, appetite change, fatigue and unexpected weight change.  HENT: Negative for hearing loss, ear pain, nosebleeds, congestion, sore throat, facial swelling, rhinorrhea, sneezing, mouth sores, trouble swallowing, neck pain, neck stiffness, dental problem, voice change, postnasal drip, sinus pressure, tinnitus and ear discharge.   Eyes: Negative for photophobia, discharge, itching and visual disturbance.  Respiratory: Positive for cough and wheezing. Negative for apnea, hemoptysis, choking, chest tightness, shortness of breath and stridor.   Cardiovascular: Negative for chest pain, palpitations and leg swelling.  Gastrointestinal:  Negative for heartburn, nausea, vomiting, abdominal pain, constipation, blood in stool and abdominal distention.  Genitourinary: Negative for dysuria, urgency, frequency, hematuria, flank pain, decreased urine volume and difficulty urinating.  Musculoskeletal: Negative for myalgias, back pain, joint swelling, arthralgias and gait problem.  Skin: Negative for color change, pallor and rash.  Neurological: Positive for headaches. Negative for dizziness, tremors, seizures, syncope, speech difficulty, weakness, light-headedness and numbness.  Hematological: Negative for environmental allergies and adenopathy. Does not bruise/bleed easily.  Psychiatric/Behavioral: Negative for confusion, sleep disturbance and agitation. The patient is nervous/anxious.        Objective:   Physical Exam Filed Vitals:   06/04/11 1023  BP: 188/78  Pulse: 64  Temp: 98.5 F (36.9 C)  TempSrc: Oral  Height: 6\' 2"  (1.88 m)  Weight: 246 lb 6.4 oz (111.766 kg)  SpO2: 95%    Gen: Pleasant, well-nourished, in no distress,  normal affect  ENT: No lesions,  mouth clear,  oropharynx clear, no postnasal drip  Neck: No JVD, no TMG, no carotid bruits  Lungs: No use of accessory muscles, no dullness to percussion, distant BS, poor airflow  Cardiovascular: RRR, heart sounds normal, no murmur or gallops, no peripheral edema  Abdomen: soft and NT, no HSM,  BS normal  Musculoskeletal: No deformities, no cyanosis or clubbing  Neuro: alert, non focal  Skin: Warm, no lesions or rashes  Dg Chest 2 View  06/04/2011  *RADIOLOGY REPORT*  Clinical Data: COPD, follow-up.  CHEST - 2 VIEW  Comparison: 04/26/2009  Findings: There is hyperinflation of the lungs compatible with COPD.  Heart and mediastinal contours are within normal limits.  No focal opacities or effusions.  No acute bony abnormality.  IMPRESSION: COPD.  No active disease or change.  Original Report Authenticated By: Cyndie Chime, M.D.          Assessment  & Plan:   COPD Moderate Copd with ongoing smoking use. Note CXR 11/12 shows mod copd changes Plan No indication for therapy of copd other than smoking cessation. Discussed options and gave smoking cessation counseling. Pt will need behavioral health referral in addition to nicotrol for smoking  Change ACE inhibitor to ARB d/t cough     Updated Medication List Outpatient Encounter Prescriptions as of 06/04/2011  Medication Sig Dispense Refill  . aspirin 81 MG tablet Take 81 mg by mouth daily.        . cyclobenzaprine (FLEXERIL) 10 MG tablet Take 1 tablet (10 mg total) by mouth 1 day or 1 dose.  50 tablet  3  . diazepam (VALIUM) 5 MG tablet Take 1 tablet (5 mg total) by mouth 2 (two) times daily.  180 tablet  1  . escitalopram (LEXAPRO) 20 MG tablet Take 1 tablet (20 mg total) by mouth daily.  100 tablet  3  . HYDROcodone-acetaminophen (VICODIN ES) 7.5-750 MG per tablet Take 1 tablet by mouth 2 (two) times daily.  60 tablet  5  . metFORMIN (GLUCOPHAGE) 500 MG tablet One tablet daily in the morning before breakfast  100 tablet  3  . simvastatin (ZOCOR) 40 MG tablet Take 1 tablet (40 mg total) by mouth at bedtime.  100 tablet  3  . DISCONTD: lisinopril-hydrochlorothiazide (PRINZIDE,ZESTORETIC) 20-25 MG per tablet TAKE 1 TABLET IN THE MORNING  100 tablet  2  . DISCONTD: metFORMIN (GLUCOPHAGE) 500 MG tablet One half tab before breakfast  90 tablet  3  . losartan-hydrochlorothiazide (HYZAAR) 100-25 MG per tablet Take 1 tablet by mouth daily.  30 tablet  11  . nicotine (NICOTROL) 10 MG inhaler 60 - 80 puffs per cartridge  6-8 cartridges per day  168 each  2

## 2011-06-04 NOTE — Patient Instructions (Signed)
Chest xray today Stop smoking with Nicotrol inhaler, use 6-8 cartridges per day, 60-80 puff per cartridge Stop prinizide and start Losartan 100/25 one daily to reduce cough side effect A referral to behavioral health for smoking cessation counseling was made Return 6 weeks

## 2011-06-05 NOTE — Progress Notes (Signed)
Quick Note:  Notify the patient that the Xray is stable and no pneumonia, Only Copd changes No change in medications are recommended. Continue current meds as prescribed at last office visit ______

## 2011-06-05 NOTE — Assessment & Plan Note (Addendum)
Moderate Copd with ongoing smoking use. Note CXR 11/12 shows mod copd changes Plan No indication for therapy of copd other than smoking cessation. Discussed options and gave smoking cessation counseling. Pt will need behavioral health referral in addition to nicotrol for smoking  Change ACE inhibitor to ARB d/t cough

## 2011-06-16 ENCOUNTER — Other Ambulatory Visit: Payer: Self-pay | Admitting: Family Medicine

## 2011-06-19 ENCOUNTER — Telehealth: Payer: Self-pay | Admitting: Family Medicine

## 2011-06-19 DIAGNOSIS — E119 Type 2 diabetes mellitus without complications: Secondary | ICD-10-CM

## 2011-06-19 MED ORDER — METFORMIN HCL 500 MG PO TABS: ORAL_TABLET | ORAL | Status: DC

## 2011-06-19 NOTE — Telephone Encounter (Signed)
[facility] tablet and it should be 1 tablet. Pls change the instructions to 1 tab in a[facility]m. [facility] [address]

## 2011-07-25 ENCOUNTER — Other Ambulatory Visit: Payer: BC Managed Care – PPO

## 2011-07-29 ENCOUNTER — Telehealth: Payer: Self-pay | Admitting: Family Medicine

## 2011-07-29 ENCOUNTER — Other Ambulatory Visit (INDEPENDENT_AMBULATORY_CARE_PROVIDER_SITE_OTHER): Payer: Medicare Other

## 2011-07-29 DIAGNOSIS — E119 Type 2 diabetes mellitus without complications: Secondary | ICD-10-CM

## 2011-07-29 LAB — BASIC METABOLIC PANEL
CO2: 31 mEq/L (ref 19–32)
Calcium: 9.2 mg/dL (ref 8.4–10.5)
Creatinine, Ser: 0.8 mg/dL (ref 0.4–1.5)
Glucose, Bld: 138 mg/dL — ABNORMAL HIGH (ref 70–99)

## 2011-07-29 NOTE — Telephone Encounter (Signed)
ok 

## 2011-07-29 NOTE — Telephone Encounter (Signed)
Patient wants to discuss if it is ok to use an electronic cigarette for quitting.  He states that Chantix has not worked for him.  Please call

## 2011-07-29 NOTE — Telephone Encounter (Signed)
Spoke with patient.

## 2011-07-31 ENCOUNTER — Ambulatory Visit: Payer: BC Managed Care – PPO | Admitting: Family Medicine

## 2011-08-05 ENCOUNTER — Encounter: Payer: Self-pay | Admitting: Family Medicine

## 2011-08-05 ENCOUNTER — Ambulatory Visit (INDEPENDENT_AMBULATORY_CARE_PROVIDER_SITE_OTHER): Payer: Medicare Other | Admitting: Family Medicine

## 2011-08-05 DIAGNOSIS — E119 Type 2 diabetes mellitus without complications: Secondary | ICD-10-CM | POA: Insufficient documentation

## 2011-08-05 DIAGNOSIS — F17200 Nicotine dependence, unspecified, uncomplicated: Secondary | ICD-10-CM

## 2011-08-05 MED ORDER — METFORMIN HCL 500 MG PO TABS: ORAL_TABLET | ORAL | Status: DC

## 2011-08-05 NOTE — Patient Instructions (Signed)
Continue the morning dose of metformin 500 mg and take a half of a 500 mg tablet prior to your evening meal  Followup office visit in 3 months nonfasting labs one week prior  Continue the smoking cessation program

## 2011-08-05 NOTE — Progress Notes (Signed)
  Subjective:    Patient ID: Drew Burke, male    DOB: 25-Jul-1945, 66 y.o.   MRN: [id number]  HPI Drew Burke is a 66 year old single male smoker however he is down to 3 cigarettes a day using the mechanical cigarettes who comes in today for followup of tobacco abuse and diabetes  His hemoglobin A1c is now down to 7.4 fasting blood sugar 135 on metformin 500 mg daily  We talked about various options. He is thin not obese he exercises 4 days per week and encouraged to walk daily the days that he doesn't exercise and we'll get him set up for a nutrition consult to address his dietary needs since he is beginning to substitute carbohydrates for nicotine  As noted above these decrease his cigarette consumption to 3 cigarettes a day on the program as outlined as above.    Review of Systems    general pulmonary metabolic review of systems otherwise negative. We sent him to see Dr. Dennie Bible for a pulmonary consult. He recommended no therapy at this time except for smoking cessation Objective:   Physical Exam  Well-developed well-nourished male in no acute distress      Assessment & Plan:  Diabetes type 2 approach and all plan increase metformin to add 250 mg prior to evening meal followup A1c in 3 months dietary consult  Ongoing tobacco abuse continue the program with the smokeless cigarettes and Nicotrol patches

## 2011-08-06 ENCOUNTER — Encounter: Payer: Self-pay | Admitting: Critical Care Medicine

## 2011-08-06 ENCOUNTER — Ambulatory Visit (INDEPENDENT_AMBULATORY_CARE_PROVIDER_SITE_OTHER): Payer: Medicare Other | Admitting: Critical Care Medicine

## 2011-08-06 DIAGNOSIS — F17200 Nicotine dependence, unspecified, uncomplicated: Secondary | ICD-10-CM

## 2011-08-06 DIAGNOSIS — J449 Chronic obstructive pulmonary disease, unspecified: Secondary | ICD-10-CM

## 2011-08-06 NOTE — Assessment & Plan Note (Signed)
Airway symptoms better off smoking Try to get off E cigarettes No inhaled meds for now

## 2011-08-06 NOTE — Assessment & Plan Note (Signed)
Ongoing tobacco use, the pt failed nicotrol He is now using an E cigarette.  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

## 2011-08-06 NOTE — Progress Notes (Signed)
Subjective:    Patient ID: Drew Burke, male    DOB: 03-06-1946, 66 y.o.   MRN: [id number]  HPI Refer for abn PFT.    08/06/2011 More recently cough is better.  Could not do well with nicotrol.   ecigarette   Works ok.    Past Medical History  Diagnosis Date  . Allergic rhinitis   . Hyperlipidemia   . Hypertension   . Tobacco abuse   . Anxiety   . Depression   . Aneurysm      Family History  Problem Relation Age of Onset  . Depression Other   . Hypertension Other   . Heart disease Father   . Cancer Mother     intestines     History   Social History  . Marital Status: Divorced    Spouse Name: N/A    Number of Children: N/A  . Years of Education: N/A   Occupational History  . sales    Social History Main Topics  . Smoking status: Current Everyday Smoker -- 0.2 packs/day for 40 years    Types: Cigarettes  . Smokeless tobacco: Never Used   Comment: used to smoke 3 ppd - currently smoking < 4 per day - started smoking at age 68.  Marland Kitchen Alcohol Use: Yes  . Drug Use: No  . Sexually Active: Not on file   Other Topics Concern  . Not on file   Social History Narrative  . No narrative on file     No Known Allergies   Outpatient Prescriptions Prior to Visit  Medication Sig Dispense Refill  . aspirin 81 MG tablet Take 81 mg by mouth daily.        . cyclobenzaprine (FLEXERIL) 10 MG tablet Take 1 tablet (10 mg total) by mouth 1 day or 1 dose.  50 tablet  3  . diazepam (VALIUM) 5 MG tablet Take 1 tablet (5 mg total) by mouth 2 (two) times daily.  180 tablet  1  . escitalopram (LEXAPRO) 20 MG tablet Take 1 tablet (20 mg total) by mouth daily.  100 tablet  3  . HYDROcodone-acetaminophen (VICODIN ES) 7.5-750 MG per tablet Take 1 tablet by mouth 2 (two) times daily.  60 tablet  5  . losartan-hydrochlorothiazide (HYZAAR) 100-25 MG per tablet Take 1 tablet by mouth daily.  30 tablet  11  . metFORMIN (GLUCOPHAGE) 500 MG tablet One tablet daily in the morning before  breakfast and one half tablet prior to evening meal  150 tablet  3  . simvastatin (ZOCOR) 40 MG tablet TAKE 1 TABLET AT BEDTIME  100 tablet  2     Review of Systems  Constitutional: Negative for diaphoresis, activity change, appetite change, fatigue and unexpected weight change.  HENT: Negative for hearing loss, nosebleeds, congestion, facial swelling, sneezing, mouth sores, trouble swallowing, neck pain, neck stiffness, dental problem, voice change, sinus pressure, tinnitus and ear discharge.   Eyes: Negative for photophobia, discharge, itching and visual disturbance.  Respiratory: Negative for apnea, choking, chest tightness and stridor.   Cardiovascular: Negative for palpitations and leg swelling.  Gastrointestinal: Negative for nausea, vomiting, abdominal pain, constipation, blood in stool and abdominal distention.  Genitourinary: Negative for dysuria, urgency, frequency, hematuria, flank pain, decreased urine volume and difficulty urinating.  Musculoskeletal: Negative for back pain, joint swelling, arthralgias and gait problem.  Skin: Negative for color change and pallor.  Neurological: Negative for dizziness, tremors, seizures, syncope, speech difficulty, weakness, light-headedness and numbness.  Hematological: Negative for  adenopathy. Does not bruise/bleed easily.  Psychiatric/Behavioral: Negative for confusion, sleep disturbance and agitation. The patient is nervous/anxious.        Objective:   Physical Exam  Filed Vitals:   08/06/11 0958  BP: 122/72  Pulse: 63  Temp: 98.2 F (36.8 C)  TempSrc: Oral  Height: 6\' 2"  (1.88 m)  Weight: 256 lb 3.2 oz (116.212 kg)  SpO2: 94%    Gen: Pleasant, well-nourished, in no distress,  normal affect  ENT: No lesions,  mouth clear,  oropharynx clear, no postnasal drip  Neck: No JVD, no TMG, no carotid bruits  Lungs: No use of accessory muscles, no dullness to percussion, distant BS,  Cardiovascular: RRR, heart sounds normal, no  murmur or gallops, no peripheral edema  Abdomen: soft and NT, no HSM,  BS normal  Musculoskeletal: No deformities, no cyanosis or clubbing  Neuro: alert, non focal  Skin: Warm, no lesions or rashes          Assessment & Plan:   TOBACCO ABUSE Ongoing tobacco use, the pt failed nicotrol He is now using an E cigarette.  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 aspirin 81 MG tablet Take 81 mg by mouth daily.        . cyclobenzaprine (FLEXERIL) 10 MG tablet Take 1 tablet (10 mg total) by mouth 1 day or 1 dose.  50 tablet  3  . diazepam (VALIUM) 5 MG tablet Take 1 tablet (5 mg total) by mouth 2 (two) times daily.  180 tablet  1  . escitalopram (LEXAPRO) 20 MG tablet Take 1 tablet (20 mg total) by mouth daily.  100 tablet  3  . HYDROcodone-acetaminophen (VICODIN ES) 7.5-750 MG per tablet Take 1 tablet by mouth 2 (two) times daily.  60 tablet  5  . losartan-hydrochlorothiazide (HYZAAR) 100-25 MG per tablet Take 1 tablet by mouth daily.  30 tablet  11  . metFORMIN (GLUCOPHAGE) 500 MG tablet One tablet daily in the morning before breakfast and one half tablet prior to evening meal  150 tablet  3  . simvastatin (ZOCOR) 40 MG tablet TAKE 1 TABLET AT BEDTIME  100 tablet  2

## 2011-08-06 NOTE — Patient Instructions (Signed)
Keep working on smoking cessation See you in 6 months [facility]

## 2011-08-14 ENCOUNTER — Other Ambulatory Visit: Payer: Self-pay | Admitting: Family Medicine

## 2011-09-09 ENCOUNTER — Encounter: Payer: Medicare Other | Attending: Family Medicine | Admitting: Dietician

## 2011-09-09 VITALS — Ht 74.0 in | Wt 256.1 lb

## 2011-09-09 DIAGNOSIS — Z713 Dietary counseling and surveillance: Secondary | ICD-10-CM | POA: Insufficient documentation

## 2011-09-09 DIAGNOSIS — E119 Type 2 diabetes mellitus without complications: Secondary | ICD-10-CM | POA: Insufficient documentation

## 2011-09-09 NOTE — Progress Notes (Signed)
  Medical Nutrition Therapy:  Appt start time: 0830 end time:     09:45  Assessment:  Primary concerns today: Learn more about diabetes control.   Gained 35-40 over last 2.5 years. He will eat his three meals per day and has 1-2 snacks per day.  He takes care of his wife and is not a cook.  He will go to the cafeteria or a [employer] and have a meal and get a second meal as take out.  He notes that his time is limited for being away from the house.  From his description, he experiences a great deal of stress on a daily basis.    MEDICATIONS: Reconciliation of medications completed.  Diabetes medications include: Metformin 500 mg with the breakfast meal and 1/2 tablet (250 mg) at the evening meal.     DIETARY INTAKE:  Describes self as having a big appetite.  24-hr recall:  B (5:00-7:00 AM): One sausage biscuit from [employer] would have had 3 cups of coffee.  1 pot black coffee each morning.  L (11;30 PM): cafeteria in [facility].  Cabbage with turnip greens or broccoli, meat grill chicken , rarely fried chicken piece of [place] Cornbread, sugar free pie or cake for the day.  Buy 2 meals and take home for dinner.  Diet coke.  OR [employer], sandwich sweet onion teriky chicken with peppers. Lettuce, tomato, onion, 12 inch.  +/- a chip.  Staci Carver get one for dinner.  Diet coke   Snk (late PM): popcorn, 1/2 bag popcorn or Lance PB crackers.  Diet coke D (4:30-5:00 PM): Kerline Trahan have the extra meal that he purchased for lunch will eat this , Diet orange or water  Snk (HS PM): Snack on the popcorn, Smucker's PB on crackers Neale Burly) or right out of the jar Beverages: probably drink 2 16 oz diet coke product per day  Current nutrient intake per recall is 2300-2400 calories per day and approximately 75-280 gms of carbohydrate.    Recent physical activity: Walks dog 6-8 times per day.   10 minutes of stop and go exercise at a time.     Estimated energy needs:HT: 74 in  WT: 256.1 lb BMI: 33%  ADJ. WT: 215 lb (98  kg) 1800-1900 calories for weight loss 210-215 g carbohydrates 140-145 g protein 50-52 g fat  Progress Towards Goal(s):  In progress.   Nutritional Diagnosis:  [place]-2.1 Inpaired nutrition utilization As related to glucose.  As evidenced by diagnosis of type 2 diabetes, A1C of 7.4%.    Intervention: Nutrition:.Review of current intake and methods to decrease the extra 500-600 calories he is consuming each day.  Explored other options that would be available to him since he does not cook and frequently use take out from selected cafeterias.  Handouts given during visit include:  Living well with Diabetes  Yellow card containing the food groups and list of carb groups and their foods, along with a suggested diet prescription.  Diet prescription of 1700-1800 calories with carb restriction of 210 gm per day.  Monitoring/Evaluation:  Dietary intake, exercise, and body weight in three months to look at his weight loss and to learn if dietary and exercise changes have lowered the A1C.

## 2011-09-17 ENCOUNTER — Other Ambulatory Visit: Payer: Self-pay | Admitting: *Deleted

## 2011-09-17 MED ORDER — HYDROCODONE-ACETAMINOPHEN 7.5-750 MG PO TABS: 1.0000 | ORAL_TABLET | Freq: Two times a day (BID) | ORAL | Status: DC

## 2011-09-18 ENCOUNTER — Encounter: Payer: Self-pay | Admitting: Dietician

## 2011-09-21 ENCOUNTER — Encounter: Payer: Self-pay | Admitting: Dietician

## 2011-09-21 NOTE — Patient Instructions (Signed)
   Aim for some walking (30-45 minutes) each day where you are not walking the dog and can get your heart rate up and can get the muscles burning some glucose for energy.  Continue with your medications.  Remember the MD Krystl Wickware need to increase your metformin to help with lowering the glucose levels.  An A1C of 7.4 %= an average glucose of 165 mg/dl. Remember that Metformin is not know for causing weight gain.  It is weight gain neutral and sometimes will help with weight loss.  Look to lose 5-10% of your weight.  This is 12-25 lbs.  This will help with lowering blood glucose levels  Try to adopt a goal of decreasing your A1C to 7.0% or less.  7% = an average blood glucose of 154 mg/dl.  Try to keep doing the diet and sugar free beverages.  Continue with your routine of 3 meals and 2 snack.  Start to measure your portions of the starches.  Read food labels.  Remember, your want to find at least 2 gm of fiber in each bread serving and if you use cereal or beans or whole grain products, you want to find at least 3 gm of fiber per serving.  On your food label, most of the time, you want to keep the sugar level to 0-9 gm of sugar per serving.  Remember that "sugar free" does not mean "carbohydrate free".  The additional starch will rapidly go to glucose.  Ask at the cafeteria if they have a nutrition count or label for their sugar free pie.  Remember that if you have time to go to your computer, you can find many the food label for many items at the "calorieking.com" web site.

## 2011-10-07 ENCOUNTER — Other Ambulatory Visit: Payer: Self-pay | Admitting: *Deleted

## 2011-10-07 DIAGNOSIS — F419 Anxiety disorder, unspecified: Secondary | ICD-10-CM

## 2011-10-07 DIAGNOSIS — F411 Generalized anxiety disorder: Secondary | ICD-10-CM

## 2011-10-07 MED ORDER — DIAZEPAM 5 MG PO TABS: 5.0000 mg | ORAL_TABLET | Freq: Two times a day (BID) | ORAL | Status: DC

## 2011-10-28 ENCOUNTER — Other Ambulatory Visit (INDEPENDENT_AMBULATORY_CARE_PROVIDER_SITE_OTHER): Payer: Medicare Other

## 2011-10-28 DIAGNOSIS — E119 Type 2 diabetes mellitus without complications: Secondary | ICD-10-CM

## 2011-10-28 LAB — BASIC METABOLIC PANEL
BUN: 15 mg/dL (ref 6–23)
Creatinine, Ser: 0.8 mg/dL (ref 0.4–1.5)
GFR: 101.5 mL/min (ref >60.00)

## 2011-10-28 LAB — MICROALBUMIN / CREATININE URINE RATIO: Microalb, Ur: 15.3 mg/dL — ABNORMAL HIGH (ref 0.0–1.9)

## 2011-11-04 ENCOUNTER — Ambulatory Visit: Payer: Medicare Other | Admitting: Family Medicine

## 2011-11-05 ENCOUNTER — Encounter: Payer: Self-pay | Admitting: Family Medicine

## 2011-11-05 ENCOUNTER — Ambulatory Visit (INDEPENDENT_AMBULATORY_CARE_PROVIDER_SITE_OTHER): Payer: Medicare Other | Admitting: Family Medicine

## 2011-11-05 VITALS — BP 120/78 | Temp 97.4°F | Wt 260.0 lb

## 2011-11-05 DIAGNOSIS — I10 Essential (primary) hypertension: Secondary | ICD-10-CM

## 2011-11-05 DIAGNOSIS — E119 Type 2 diabetes mellitus without complications: Secondary | ICD-10-CM

## 2011-11-05 MED ORDER — METFORMIN HCL 500 MG PO TABS: 500.0000 mg | ORAL_TABLET | Freq: Two times a day (BID) | ORAL | Status: DC

## 2011-11-05 MED ORDER — LOSARTAN POTASSIUM-HCTZ 100-25 MG PO TABS: 1.0000 | ORAL_TABLET | Freq: Every day | ORAL | Status: DC

## 2011-11-05 MED ORDER — METFORMIN HCL 500 MG PO TABS: ORAL_TABLET | ORAL | Status: DC

## 2011-11-05 NOTE — Progress Notes (Signed)
  Subjective:    Patient ID: Drew Burke, male    DOB: Mar 04, 1946, 66 y.o.   MRN: [id number]  HPI Drew Burke is a 66 year old single male smoker who comes in today for evaluation of diabetes type 2 and hypertension  His fasting blood sugars are averaging about 102 however his A1c is up to 7.8%. Will increase his metformin encourage again diet exercise  His antihypertension medication was switched by Dr. Delford Field to Hyzaar  taking the lisinopril might be causing the cough. He does not feel any different on the Hyzaar   Review of Systems    general and cardiovascular review of systems otherwise negative Objective:   Physical Exam Well-developed well-nourished man in acute distress BP right arm sitting position 120/78       Assessment & Plan:  Diabetes type 2 not at goal plan increase metformin to 500 mg twice a day walk 20 minutes daily return in 3 months for followup  Hypertension ago on Hyzaar continue current medication

## 2011-11-05 NOTE — Patient Instructions (Signed)
Increase the metformin to 500 mg,,,,,,,,,,, one tablet twice daily  Walk 30 minutes daily  Avoid sugar  Check a fasting blood sugar Monday Wednesday Friday  Return in 3 months for followup nonfasting labs one week prior  Continue the Hyzaar one tablet daily for blood pressure control

## 2011-11-29 ENCOUNTER — Other Ambulatory Visit: Payer: Self-pay | Admitting: Family Medicine

## 2012-01-29 ENCOUNTER — Other Ambulatory Visit (INDEPENDENT_AMBULATORY_CARE_PROVIDER_SITE_OTHER): Payer: Medicare Other

## 2012-01-29 DIAGNOSIS — E119 Type 2 diabetes mellitus without complications: Secondary | ICD-10-CM

## 2012-01-29 LAB — BASIC METABOLIC PANEL
CO2: 30 mEq/L (ref 19–32)
Calcium: 9.5 mg/dL (ref 8.4–10.5)
Chloride: 96 mEq/L (ref 96–112)
Glucose, Bld: 167 mg/dL — ABNORMAL HIGH (ref 70–99)
Potassium: 3.6 mEq/L (ref 3.5–5.1)
Sodium: 136 mEq/L (ref 135–145)

## 2012-01-29 LAB — MICROALBUMIN / CREATININE URINE RATIO: Microalb Creat Ratio: 2 mg/g (ref 0.0–30.0)

## 2012-01-29 LAB — HEMOGLOBIN A1C: Hgb A1c MFr Bld: 8.1 % — ABNORMAL HIGH (ref 4.6–6.5)

## 2012-02-04 ENCOUNTER — Ambulatory Visit: Payer: Medicare Other | Admitting: Critical Care Medicine

## 2012-02-05 ENCOUNTER — Encounter: Payer: Self-pay | Admitting: Family Medicine

## 2012-02-05 ENCOUNTER — Ambulatory Visit (INDEPENDENT_AMBULATORY_CARE_PROVIDER_SITE_OTHER): Payer: Medicare Other | Admitting: Family Medicine

## 2012-02-05 VITALS — BP 140/90 | Temp 99.1°F | Wt 255.0 lb

## 2012-02-05 DIAGNOSIS — F17200 Nicotine dependence, unspecified, uncomplicated: Secondary | ICD-10-CM

## 2012-02-05 DIAGNOSIS — E119 Type 2 diabetes mellitus without complications: Secondary | ICD-10-CM

## 2012-02-05 DIAGNOSIS — I10 Essential (primary) hypertension: Secondary | ICD-10-CM

## 2012-02-05 MED ORDER — METFORMIN HCL 1000 MG PO TABS: 1000.0000 mg | ORAL_TABLET | Freq: Every day | ORAL | Status: DC

## 2012-02-05 NOTE — Progress Notes (Signed)
  Subjective:    Patient ID: Drew Burke, male    DOB: 1945/12/24, 66 y.o.   MRN: [id number]  HPI Drew Burke is a 66 year old male smoker who comes in today for followup of tobacco abuse hypertension and diabetes  He continues to smoke he knows the risks of heart attack and stroke  He declines a smoking cessation program  His fasting blood sugars 167 A1c was 8.1%. He states he's been unable to walk as he has a bone spur in his fifth toe.  BP 140/90 on Hyzaar 100-25 daily    Review of Systems    general and metabolic review of systems otherwise negative Objective:   Physical Exam Well-developed well-nourished male in no acute distress       Assessment & Plan:  Diabetes type 2 out of control increase metformin as outlined return in 2 months for followup  Hypertension continue current medication  Tobacco abuse again and outlined smoking cessation program patient declined

## 2012-02-05 NOTE — Patient Instructions (Addendum)
Take 1000 mg of metformin prior to breakfast and 500 prior to you male  Check a fasting blood sugar daily in the morning  If after one month your blood sugars not normal,,,,,,,,,,, around 100,,,,,,,,, then increase the metformin to 1000 mg twice daily  Followup in 2 months labs one week prior to office visit

## 2012-02-09 ENCOUNTER — Other Ambulatory Visit: Payer: Self-pay | Admitting: *Deleted

## 2012-02-09 MED ORDER — TRAMADOL HCL 50 MG PO TABS: 50.0000 mg | ORAL_TABLET | Freq: Two times a day (BID) | ORAL | Status: AC | PRN

## 2012-02-09 MED ORDER — FREESTYLE LANCETS MISC: 1.0000 | Freq: Every day | Status: DC

## 2012-02-09 MED ORDER — GLUCOSE BLOOD VI STRP: 1.0000 | ORAL_STRIP | Freq: Every day | Status: DC

## 2012-02-09 NOTE — Telephone Encounter (Signed)
Rx sent and patient is aware. 

## 2012-02-09 NOTE — Telephone Encounter (Signed)
Please call Mr. Grainger and tell him we are going to switch to tramadol 50 mg 1/2-1 tablet twice daily when necessary for pain dispense 60 tabs refills x5 and explained to him it's as good as Vicodin but no narcotic side effects

## 2012-02-09 NOTE — Telephone Encounter (Signed)
Patient requests refill of Vicodin is this okay to fill?

## 2012-02-11 ENCOUNTER — Telehealth: Payer: Self-pay | Admitting: Family Medicine

## 2012-02-11 MED ORDER — HYDROCODONE-ACETAMINOPHEN 7.5-750 MG PO TABS: 1.0000 | ORAL_TABLET | Freq: Three times a day (TID) | ORAL | Status: DC | PRN

## 2012-02-11 NOTE — Telephone Encounter (Signed)
Okay to switch back to the Vicodin and dispensed 60 tabs directions 1/2-1 tablet twice daily as needed for severe pain refills x3

## 2012-02-11 NOTE — Telephone Encounter (Signed)
Rx called in and patient is aware 

## 2012-02-11 NOTE — Telephone Encounter (Signed)
Pt called and said that he is having problems with Tramadol. Causing lightheadedness and headaches. Pt said that Hydrocodone works better. Pt wants to be switched back to HYDROcodone-acetaminophen (VICODIN ES) 7.5-750 MG per tablet [facility] [address]

## 2012-03-09 ENCOUNTER — Telehealth: Payer: Self-pay | Admitting: Family Medicine

## 2012-03-09 NOTE — Telephone Encounter (Signed)
Clarify SIG on Vicodin. Rx written by Dr. Tawanna Cooler on 8/7. Vicodin ES. Pharmacy states he has been getting pain meds from multiple sources. Please call.

## 2012-03-11 NOTE — Telephone Encounter (Signed)
Fleet Contras please call explained to him that we got a report that he is getting pain medicine from other sources therefore we will no longer be able to write his pain medication

## 2012-03-12 ENCOUNTER — Ambulatory Visit: Payer: Medicare Other | Admitting: Critical Care Medicine

## 2012-03-12 NOTE — Telephone Encounter (Signed)
Spoke with pharmacy and Vicodin will no longer be filled by Dr Tawanna Cooler

## 2012-03-31 ENCOUNTER — Other Ambulatory Visit: Payer: Self-pay | Admitting: Family Medicine

## 2012-04-01 ENCOUNTER — Other Ambulatory Visit (INDEPENDENT_AMBULATORY_CARE_PROVIDER_SITE_OTHER): Payer: Medicare Other

## 2012-04-01 DIAGNOSIS — E119 Type 2 diabetes mellitus without complications: Secondary | ICD-10-CM

## 2012-04-01 LAB — BASIC METABOLIC PANEL
BUN: 16 mg/dL (ref 6–23)
Calcium: 9.1 mg/dL (ref 8.4–10.5)
Chloride: 99 mEq/L (ref 96–112)
Creatinine, Ser: 0.8 mg/dL (ref 0.4–1.5)
GFR: 97.2 mL/min (ref >60.00)

## 2012-04-01 LAB — HEMOGLOBIN A1C: Hgb A1c MFr Bld: 7.4 % — ABNORMAL HIGH (ref 4.6–6.5)

## 2012-04-04 ENCOUNTER — Other Ambulatory Visit: Payer: Self-pay | Admitting: Family Medicine

## 2012-04-05 ENCOUNTER — Telehealth: Payer: Self-pay | Admitting: Family Medicine

## 2012-04-05 NOTE — Telephone Encounter (Signed)
Fleet Contras please Mr. Rockers to explain our policy okay to refill medications if you feel like he is being honest

## 2012-04-05 NOTE — Telephone Encounter (Signed)
Pt called and said that he had foot surgery late July 2013 at [facility] Dr Theodosia Paling [phone]. Pt had a bone spur removed and a hammer toe fixed.  Pt was prescribed Oxycodone for a while, but then med was changed HYDROcodone-acetaminophen. Pt is no longer getting pain med from [facility].  Pt is needing to get a refill of HYDROcodone-acetaminophen (VICODIN ES) 7.5-750 MG per tablet. Pt takes 2 per day. [facility] [address]

## 2012-04-06 ENCOUNTER — Ambulatory Visit (INDEPENDENT_AMBULATORY_CARE_PROVIDER_SITE_OTHER): Payer: Medicare Other | Admitting: Family Medicine

## 2012-04-06 ENCOUNTER — Encounter: Payer: Self-pay | Admitting: *Deleted

## 2012-04-06 ENCOUNTER — Encounter: Payer: Self-pay | Admitting: Family Medicine

## 2012-04-06 VITALS — BP 140/90 | Temp 97.7°F | Wt 244.0 lb

## 2012-04-06 DIAGNOSIS — F411 Generalized anxiety disorder: Secondary | ICD-10-CM

## 2012-04-06 DIAGNOSIS — F419 Anxiety disorder, unspecified: Secondary | ICD-10-CM

## 2012-04-06 DIAGNOSIS — F17200 Nicotine dependence, unspecified, uncomplicated: Secondary | ICD-10-CM

## 2012-04-06 DIAGNOSIS — E119 Type 2 diabetes mellitus without complications: Secondary | ICD-10-CM

## 2012-04-06 MED ORDER — DIAZEPAM 5 MG PO TABS: 5.0000 mg | ORAL_TABLET | Freq: Two times a day (BID) | ORAL | Status: DC

## 2012-04-06 MED ORDER — METFORMIN HCL 1000 MG PO TABS: ORAL_TABLET | ORAL | Status: DC

## 2012-04-06 MED ORDER — HYDROCODONE-ACETAMINOPHEN 7.5-750 MG PO TABS: 1.0000 | ORAL_TABLET | Freq: Two times a day (BID) | ORAL | Status: AC

## 2012-04-06 NOTE — Telephone Encounter (Signed)
Patient called stating that he would like a call back this a.m.

## 2012-04-06 NOTE — Addendum Note (Signed)
Addended by: Kern Reap B on: 04/06/2012 04:38 PM   Modules accepted: Orders

## 2012-04-06 NOTE — Patient Instructions (Signed)
Increase metformin to 1000 mg twice daily  Strongly consider the Chantix smoking cessation program  Followup in 3 months nonfasting labs one week prior

## 2012-04-06 NOTE — Telephone Encounter (Signed)
Patient will need an office visit.  Scheduled.

## 2012-04-06 NOTE — Progress Notes (Signed)
  Subjective:    Patient ID: Drew Burke, male    DOB: July 26, 1945, 66 y.o.   MRN: [id number]  HPI Drew Burke is a 66 year old single male smoker with underlying diabetes and hypertension who comes in today for evaluation  His blood pressure is 140/90 on Hyzaar 100-25 daily. He states his blood pressure at home is lower and actually normal  He takes Valium 5 mg twice daily for chronic anxiety  and Vicodin ES twice a day for chronic pain.  He had foot surgery recently and was given OxyContin by the foot surgeon. Because of this we called in and have them come in to discuss his narcotic use. He signed a pain contract and will he get his medication from a knee.  He takes metformin 1000 mg with breakfast 500 mg prior to evening meal A1c pending recent blood sugar 146 A1c down to 7.4%.     Review of Systems    general and metabolic review of systems otherwise negative he again declines the smoking cessation program Objective:   Physical Exam Well-developed well-nourished male in no acute distress       Assessment & Plan:  Diabetes type 2 approaching goal plan increase metformin 1000 mg twice a day followup in 3 months  Chronic anxiety continue Valium 5 mg twice a day  Chronic pain continue Vicodin 1 twice a day  Hypertension at goal continue current therapy

## 2012-04-08 ENCOUNTER — Ambulatory Visit: Payer: Medicare Other | Admitting: Family Medicine

## 2012-04-13 ENCOUNTER — Encounter: Payer: Self-pay | Admitting: Critical Care Medicine

## 2012-04-13 ENCOUNTER — Ambulatory Visit (INDEPENDENT_AMBULATORY_CARE_PROVIDER_SITE_OTHER): Payer: Medicare Other | Admitting: Critical Care Medicine

## 2012-04-13 VITALS — BP 108/68 | HR 77 | Temp 97.8°F | Ht 74.0 in | Wt 248.8 lb

## 2012-04-13 DIAGNOSIS — J449 Chronic obstructive pulmonary disease, unspecified: Secondary | ICD-10-CM

## 2012-04-13 DIAGNOSIS — F17200 Nicotine dependence, unspecified, uncomplicated: Secondary | ICD-10-CM

## 2012-04-13 NOTE — Assessment & Plan Note (Signed)
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

## 2012-04-13 NOTE — Progress Notes (Signed)
Subjective:    Patient ID: Drew Burke, male    DOB: 07-Apr-1946, 66 y.o.   MRN: [id number]  HPI  04/13/2012 The patient continues to have chronic cough.   Patient is still smoking 1 pack a day. The patient undergone a lot of stressors in his life recently. The patient is now willing to pursue smoking cessation. Patient denies any inhaled medications. Patient denies any dyspnea at rest or exertion. There is no swelling in lower extremities. Is no fever chills or sweats. There is no chest pain.   Past Medical History  Diagnosis Date  . Allergic rhinitis   . Hyperlipidemia   . Hypertension   . Tobacco abuse   . Anxiety   . Depression   . Aneurysm      Family History  Problem Relation Age of Onset  . Depression Other   . Hypertension Other   . Heart disease Father   . Cancer Mother     intestines     History   Social History  . Marital Status: Divorced    Spouse Name: N/A    Number of Children: N/A  . Years of Education: N/A   Occupational History  . sales    Social History Main Topics  . Smoking status: Current Every Day Smoker -- 1.0 packs/day    Types: Cigarettes  . Smokeless tobacco: Never Used   Comment: used to smoke 3 ppd - currently smoking 1 ppd - started smoking at age 39.  Marland Kitchen Alcohol Use: Yes  . Drug Use: No  . Sexually Active: Not on file   Other Topics Concern  . Not on file   Social History Narrative  . No narrative on file     No Known Allergies   Outpatient Prescriptions Prior to Visit  Medication Sig Dispense Refill  . aspirin 81 MG tablet Take 81 mg by mouth daily.        . cyclobenzaprine (FLEXERIL) 10 MG tablet TAKE 1 TABLET AT BEDTIME AS NEEDED FOR PAIN  60 tablet  3  . diazepam (VALIUM) 5 MG tablet Take 1 tablet (5 mg total) by mouth 2 (two) times daily.  180 tablet  1  . escitalopram (LEXAPRO) 20 MG tablet Take 1 tablet (20 mg total) by mouth daily.  100 tablet  3  . glucose blood (FREESTYLE LITE) test strip 1 each by Other route  daily. Dx 250.00  100 each  12  . HYDROcodone-acetaminophen (VICODIN ES) 7.5-750 MG per tablet Take 1 tablet by mouth 2 (two) times daily.  60 tablet  3  . Lancets (FREESTYLE) lancets 1 each by Other route daily. Dx 250.00  100 each  12  . losartan-hydrochlorothiazide (HYZAAR) 100-25 MG per tablet Take 1 tablet by mouth daily.  100 tablet  3  . metFORMIN (GLUCOPHAGE) 1000 MG tablet 1 by mouth twice a day  200 tablet  3  . simvastatin (ZOCOR) 40 MG tablet TAKE 1 TABLET AT BEDTIME  100 tablet  2  . metFORMIN (GLUCOPHAGE) 500 MG tablet 1 by mouth twice a day  200 tablet  3     Review of Systems  Constitutional: Negative for diaphoresis, activity change, appetite change, fatigue and unexpected weight change.  HENT: Negative for hearing loss, nosebleeds, congestion, facial swelling, sneezing, mouth sores, trouble swallowing, neck pain, neck stiffness, dental problem, voice change, sinus pressure, tinnitus and ear discharge.   Eyes: Negative for photophobia, discharge, itching and visual disturbance.  Respiratory: Negative for apnea, choking, chest  tightness and stridor.   Cardiovascular: Negative for palpitations and leg swelling.  Gastrointestinal: Negative for nausea, vomiting, abdominal pain, constipation, blood in stool and abdominal distention.  Genitourinary: Negative for dysuria, urgency, frequency, hematuria, flank pain, decreased urine volume and difficulty urinating.  Musculoskeletal: Negative for back pain, joint swelling, arthralgias and gait problem.  Skin: Negative for color change and pallor.  Neurological: Negative for dizziness, tremors, seizures, syncope, speech difficulty, weakness, light-headedness and numbness.  Hematological: Negative for adenopathy. Does not bruise/bleed easily.  Psychiatric/Behavioral: Negative for confusion, disturbed wake/sleep cycle and agitation. The patient is nervous/anxious.        Objective:   Physical Exam  Filed Vitals:   04/13/12 1033  BP:  108/68  Pulse: 77  Temp: 97.8 F (36.6 C)  TempSrc: Oral  Height: 6\' 2"  (1.88 m)  Weight: 248 lb 12.8 oz (112.855 kg)  SpO2: 96%    Gen: Pleasant, well-nourished, in no distress,  normal affect  ENT: No lesions,  mouth clear,  oropharynx clear, no postnasal drip  Neck: No JVD, no TMG, no carotid bruits  Lungs: No use of accessory muscles, no dullness to percussion, distant BS,  Cardiovascular: RRR, heart sounds normal, no murmur or gallops, no peripheral edema  Abdomen: soft and NT, no HSM,  BS normal  Musculoskeletal: No deformities, no cyanosis or clubbing  Neuro: alert, non focal  Skin: Warm, no lesions or rashes          Assessment & Plan:   COPD Gold A 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 aspirin 81 MG tablet Take 81 mg by mouth daily.        . cyclobenzaprine (FLEXERIL) 10 MG tablet TAKE 1 TABLET AT BEDTIME AS NEEDED FOR PAIN  60 tablet  3  . diazepam (VALIUM) 5 MG tablet Take 1 tablet (5 mg total) by mouth 2 (two) times daily.  180 tablet  1  . escitalopram (LEXAPRO) 20 MG tablet Take 1 tablet (20 mg total) by mouth daily.  100 tablet  3  . glucose blood (FREESTYLE LITE) test strip 1 each by Other route daily. Dx 250.00  100 each  12  . HYDROcodone-acetaminophen (VICODIN ES) 7.5-750 MG per tablet Take 1 tablet by mouth 2 (two) times daily.  60 tablet  3  . Lancets (FREESTYLE) lancets 1 each by Other route daily. Dx 250.00  100 each  12    . losartan-hydrochlorothiazide (HYZAAR) 100-25 MG per tablet Take 1 tablet by mouth daily.  100 tablet  3  . metFORMIN (GLUCOPHAGE) 1000 MG tablet 1 by mouth twice a day  200 tablet  3  . simvastatin (ZOCOR) 40 MG tablet TAKE 1 TABLET AT BEDTIME  100 tablet  2  . DISCONTD: metFORMIN (GLUCOPHAGE) 500 MG tablet 1 by mouth twice a day  200 tablet  3

## 2012-04-13 NOTE — Patient Instructions (Addendum)
No change in medications. Return in        12   months       Continue to work on smoking cessation

## 2012-04-13 NOTE — Assessment & Plan Note (Addendum)
History of tobacco use with on going cough Patient needs to pursue smoking cessation and use nicotine replacement therapy 3- 10 minutes of smoking cessation counseling was given to the patient at this visit

## 2012-04-15 ENCOUNTER — Other Ambulatory Visit: Payer: Self-pay | Admitting: Family Medicine

## 2012-04-24 ENCOUNTER — Other Ambulatory Visit: Payer: Self-pay | Admitting: Family Medicine

## 2012-05-02 ENCOUNTER — Other Ambulatory Visit: Payer: Self-pay | Admitting: Family Medicine

## 2012-05-25 ENCOUNTER — Other Ambulatory Visit: Payer: Self-pay | Admitting: Critical Care Medicine

## 2012-06-11 ENCOUNTER — Encounter: Payer: Self-pay | Admitting: Family Medicine

## 2012-06-11 ENCOUNTER — Ambulatory Visit (INDEPENDENT_AMBULATORY_CARE_PROVIDER_SITE_OTHER): Payer: Medicare Other | Admitting: Family Medicine

## 2012-06-11 VITALS — BP 122/68 | Temp 98.8°F | Wt 241.0 lb

## 2012-06-11 DIAGNOSIS — J441 Chronic obstructive pulmonary disease with (acute) exacerbation: Secondary | ICD-10-CM

## 2012-06-11 MED ORDER — METHYLPREDNISOLONE ACETATE 80 MG/ML IJ SUSP
80.0000 mg | Freq: Once | INTRAMUSCULAR | Status: AC
  Administered 2012-06-11: 80 mg via INTRAMUSCULAR

## 2012-06-11 MED ORDER — DOXYCYCLINE HYCLATE 100 MG PO TABS: 100.0000 mg | ORAL_TABLET | Freq: Two times a day (BID) | ORAL | Status: DC

## 2012-06-11 MED ORDER — ALBUTEROL SULFATE HFA 108 (90 BASE) MCG/ACT IN AERS: 2.0000 | INHALATION_SPRAY | RESPIRATORY_TRACT | Status: DC | PRN

## 2012-06-11 NOTE — Patient Instructions (Addendum)
Follow up promptly for any fever or worsening symptoms 

## 2012-06-11 NOTE — Progress Notes (Signed)
  Subjective:    Patient ID: Drew Burke, male    DOB: 10/02/1945, 66 y.o.   MRN: [id number]  HPI  Patient seen with acute issue of 4 to five-day history of cough productive of green sputum. He has history of COPD and ongoing nicotine use. No fever. No dyspnea. Intermittent sweats. Denies any hemoptysis. No pleuritic pain. He tried over-the-counter Coricidin without much improvement in cough. Patient has type 2 diabetes which has been fairly well controlled.   Review of Systems  Constitutional: Negative for fever and chills.  HENT: Positive for congestion.   Respiratory: Positive for cough. Negative for shortness of breath and wheezing.        Objective:   Physical Exam  Constitutional: He appears well-developed and well-nourished.  HENT:  Right Ear: External ear normal.  Left Ear: External ear normal.  Mouth/Throat: Oropharynx is clear and moist.  Neck: Neck supple.  Cardiovascular: Normal rate and regular rhythm.   Pulmonary/Chest: Effort normal.       Patient has some faint diffuse wheezes. No rales. No increased work of breathing. Normal respiratory rate.  Lymphadenopathy:    He has no cervical adenopathy.          Assessment & Plan:  Acute exacerbation of COPD. Depo-Medrol 80 mg IM given and follow blood sugars closely. Start doxycycline 100 mg twice a day for 10 days. Proair inhaler as needed for any wheezing.

## 2012-07-01 ENCOUNTER — Other Ambulatory Visit (INDEPENDENT_AMBULATORY_CARE_PROVIDER_SITE_OTHER): Payer: Medicare Other

## 2012-07-01 DIAGNOSIS — E119 Type 2 diabetes mellitus without complications: Secondary | ICD-10-CM

## 2012-07-01 LAB — BASIC METABOLIC PANEL
BUN: 14 mg/dL (ref 6–23)
CO2: 30 mEq/L (ref 19–32)
Chloride: 99 mEq/L (ref 96–112)
Glucose, Bld: 130 mg/dL — ABNORMAL HIGH (ref 70–99)
Potassium: 4.3 mEq/L (ref 3.5–5.1)

## 2012-07-08 ENCOUNTER — Ambulatory Visit (INDEPENDENT_AMBULATORY_CARE_PROVIDER_SITE_OTHER): Payer: Medicare Other | Admitting: Family Medicine

## 2012-07-08 ENCOUNTER — Encounter: Payer: Self-pay | Admitting: Family Medicine

## 2012-07-08 VITALS — BP 130/80 | Temp 98.1°F | Wt 247.0 lb

## 2012-07-08 DIAGNOSIS — Z720 Tobacco use: Secondary | ICD-10-CM

## 2012-07-08 DIAGNOSIS — M549 Dorsalgia, unspecified: Secondary | ICD-10-CM

## 2012-07-08 DIAGNOSIS — F17200 Nicotine dependence, unspecified, uncomplicated: Secondary | ICD-10-CM

## 2012-07-08 DIAGNOSIS — E119 Type 2 diabetes mellitus without complications: Secondary | ICD-10-CM

## 2012-07-08 DIAGNOSIS — G8929 Other chronic pain: Secondary | ICD-10-CM

## 2012-07-08 DIAGNOSIS — I10 Essential (primary) hypertension: Secondary | ICD-10-CM

## 2012-07-08 MED ORDER — TRAMADOL HCL 50 MG PO TABS: ORAL_TABLET | ORAL | Status: DC

## 2012-07-08 MED ORDER — VARENICLINE TARTRATE 1 MG PO TABS: ORAL_TABLET | ORAL | Status: DC

## 2012-07-08 NOTE — Patient Instructions (Addendum)
Use the e- cigarettes  Taper this cigarettes by 2 per week  Chantix,,,,,,,,, one half tab every morning x1 week then one half tab twice daily  Return in 6 weeks for followup

## 2012-07-08 NOTE — Progress Notes (Signed)
  Subjective:    Patient ID: Drew Burke, male    DOB: 1946/04/30, 67 y.o.   MRN: [id number]  HPI Drew Burke is a 67 year old single male nonsmoker who comes in today for followup of tobacco abuse hypertension and diabetes and back pain  His blood sugar on metformin 1000 mg twice daily is 1:30 with an A1c is 7.5%. He's not doing his exercise on a regular basis. We discussed doing exercises in lieu of changing his medication  His blood pressure is 130/80 on losartan 100-25 daily  He continues to smoke 10+ cigarettes a day. He understands the risk of heart attack stroke and sudden death with a combination of diabetes hypertension and smoking. I encouraged him to try smoking cessation program again. He states he only thing that really worked with e- cigarettes   Review of Systems    general review of systems otherwise negative Objective:   Physical Exam  Well-developed well-nourished male in no acute distress      Assessment & Plan:  Diabetes type 2 approaching goal continue current therapy at 30 minutes of walking daily followup A1c in 3 months  Hypertension at goal continue current therapy  Tobacco abuse begin smoking cessation program again with the cigarettes and tapering and Chantix followup in 6 weeks  Chronic back pain switched to tramadol

## 2012-07-09 ENCOUNTER — Telehealth: Payer: Self-pay | Admitting: Family Medicine

## 2012-07-09 NOTE — Telephone Encounter (Signed)
Patient says that the tramadol is not helping his pain and he would like to go back on the hydrocodone and increase his dose

## 2012-07-13 ENCOUNTER — Other Ambulatory Visit: Payer: Self-pay | Admitting: Family Medicine

## 2012-07-13 NOTE — Telephone Encounter (Signed)
Patient calling back requesting hydrocodone

## 2012-07-14 MED ORDER — HYDROCODONE-ACETAMINOPHEN 7.5-325 MG PO TABS: ORAL_TABLET | ORAL | Status: DC

## 2012-07-14 NOTE — Telephone Encounter (Signed)
Rx called in 

## 2012-07-14 NOTE — Telephone Encounter (Signed)
Okay per Dr Tawanna Cooler #60 1/2 tab bid with 5 refills

## 2012-07-15 ENCOUNTER — Telehealth: Payer: Self-pay | Admitting: Family Medicine

## 2012-07-15 DIAGNOSIS — M549 Dorsalgia, unspecified: Secondary | ICD-10-CM

## 2012-07-15 NOTE — Telephone Encounter (Signed)
Patient called stating that he need a referral to Pain Management as he tried on his own and they stated he need a referral. Please assist.

## 2012-07-20 ENCOUNTER — Encounter: Payer: Self-pay | Admitting: Physical Medicine & Rehabilitation

## 2012-08-02 ENCOUNTER — Ambulatory Visit: Payer: Medicare Other | Admitting: Physical Medicine & Rehabilitation

## 2012-08-09 ENCOUNTER — Other Ambulatory Visit: Payer: Self-pay | Admitting: Family Medicine

## 2012-08-09 NOTE — Telephone Encounter (Signed)
Pt says his prior script for HYDROcodone-acetaminophen (NORCO) 7.5-325 MG per tablet   was 1 pill 2x day, and the new script is 1/2 pill 2xday.  Pt says this is not enough. Pls advise. Would like new script for correct dosage. [facility] [address]

## 2012-08-10 NOTE — Telephone Encounter (Signed)
Fleet Contras please call in a new prescription for Mr. Malkin Vicodin ES #60 one tablet twice daily for severe pain refills x5

## 2012-08-11 MED ORDER — HYDROCODONE-ACETAMINOPHEN 7.5-325 MG PO TABS: 1.0000 | ORAL_TABLET | Freq: Two times a day (BID) | ORAL | Status: DC

## 2012-08-11 NOTE — Telephone Encounter (Signed)
Rx called in 

## 2012-08-19 ENCOUNTER — Ambulatory Visit: Payer: Medicare Other | Admitting: Family Medicine

## 2012-08-23 ENCOUNTER — Encounter: Payer: Self-pay | Admitting: Family Medicine

## 2012-08-23 ENCOUNTER — Ambulatory Visit (INDEPENDENT_AMBULATORY_CARE_PROVIDER_SITE_OTHER): Payer: Medicare Other | Admitting: Family Medicine

## 2012-08-23 VITALS — BP 120/80 | HR 64 | Temp 98.7°F | Wt 247.0 lb

## 2012-08-23 DIAGNOSIS — F17200 Nicotine dependence, unspecified, uncomplicated: Secondary | ICD-10-CM

## 2012-08-23 DIAGNOSIS — Z720 Tobacco use: Secondary | ICD-10-CM

## 2012-08-23 MED ORDER — VARENICLINE TARTRATE 1 MG PO TABS: ORAL_TABLET | ORAL | Status: DC

## 2012-08-23 NOTE — Progress Notes (Signed)
  Subjective:    Patient ID: Drew Burke, male    DOB: 1945-08-19, 67 y.o.   MRN: [id number]  HPI  Drew Burke is a 67 year old male who comes in today for followup of multiple issues  He has a history of underlying hyperlipidemia, diabetes, hypertension, and tobacco abuse. We have been working with him to try to decrease his risk factors namely stop smoking tobacco. Last visit we gave him a prescription for Chantix but he never got filled.  He does want to try to stop smoking and wishes to have a prescription of Chantix refilled  Review of Systems Review of systems otherwise negative    Objective:   Physical Exam Well-developed well-nourished male in no acute distress       Assessment & Plan:

## 2012-08-23 NOTE — Patient Instructions (Addendum)
Chantix,,,,,,,,,,,, one half tab daily for 1 week then one half tab twice daily  Valium. 5 mg.......... One twice daily  Limit your cigarettes to no more than 5 per day for one week............. Then taper by one per week  Followup in 4 weeks

## 2012-09-20 ENCOUNTER — Ambulatory Visit: Payer: Medicare Other | Admitting: Family Medicine

## 2012-10-21 ENCOUNTER — Encounter: Payer: Self-pay | Admitting: Family Medicine

## 2012-10-21 ENCOUNTER — Ambulatory Visit (INDEPENDENT_AMBULATORY_CARE_PROVIDER_SITE_OTHER): Payer: Medicare Other | Admitting: Family Medicine

## 2012-10-21 VITALS — BP 120/76 | Temp 98.6°F | Wt 249.0 lb

## 2012-10-21 DIAGNOSIS — Z720 Tobacco use: Secondary | ICD-10-CM

## 2012-10-21 DIAGNOSIS — F17200 Nicotine dependence, unspecified, uncomplicated: Secondary | ICD-10-CM

## 2012-10-21 DIAGNOSIS — E119 Type 2 diabetes mellitus without complications: Secondary | ICD-10-CM

## 2012-10-21 DIAGNOSIS — J449 Chronic obstructive pulmonary disease, unspecified: Secondary | ICD-10-CM

## 2012-10-21 LAB — BASIC METABOLIC PANEL
BUN: 18 mg/dL (ref 6–23)
Chloride: 101 mEq/L (ref 96–112)
Creatinine, Ser: 0.9 mg/dL (ref 0.4–1.5)
Glucose, Bld: 97 mg/dL (ref 70–99)
Potassium: 4.1 mEq/L (ref 3.5–5.1)

## 2012-10-21 MED ORDER — VARENICLINE TARTRATE 1 MG PO TABS: ORAL_TABLET | ORAL | Status: DC

## 2012-10-21 NOTE — Patient Instructions (Addendum)
Continue your current medications  Chantix one twice daily  Begin to taper by one cigarette per week  Set up a physical exam in 3 months  Labs one week prior

## 2012-10-21 NOTE — Progress Notes (Signed)
  Subjective:    Patient ID: Drew Burke, male    DOB: 1945/08/01, 67 y.o.   MRN: [id number]  HPI Drew Burke is a 67 year old male single smoker but down to 15 cigarettes a day on the Chantix one tablet twice daily who comes in today for followup of diabetes hypertension and tobacco abuse  His blood sugar this morning was 127. He's on metformin 1000 mg twice a day A1c will be drawn today  His blood pressure is normal on medication 120/76  He's increase the Chantix a full tablet twice daily and has cut his cigarette consumption to 15 per day. We outlined a program to taper over the next 15 weeks  He continues to take Valium 5 mg twice a day and Vicodin twice a day for chronic back pain. I explained to him that if his pain became worse we would get a consult with Dr. Vear Clock in the pain clinic   Review of Systems    review of systems otherwise negative Objective:   Physical Exam  Well-developed thin male no acute distress BP right arm sitting position 120/76      Assessment & Plan:  Diabetes type 2 check labs  Tobacco abuse continue Chantix one twice a day and begin tapering as outlined at one per week  Chronic pain stable on Valium twice a day and Vicodin twice a day  Hypertension at goal continue current therapy

## 2012-11-15 ENCOUNTER — Other Ambulatory Visit: Payer: Self-pay | Admitting: Family Medicine

## 2013-01-11 ENCOUNTER — Other Ambulatory Visit: Payer: Self-pay | Admitting: Family Medicine

## 2013-01-13 ENCOUNTER — Other Ambulatory Visit (INDEPENDENT_AMBULATORY_CARE_PROVIDER_SITE_OTHER): Payer: Medicare Other

## 2013-01-13 DIAGNOSIS — Z720 Tobacco use: Secondary | ICD-10-CM

## 2013-01-13 DIAGNOSIS — E119 Type 2 diabetes mellitus without complications: Secondary | ICD-10-CM

## 2013-01-13 DIAGNOSIS — J449 Chronic obstructive pulmonary disease, unspecified: Secondary | ICD-10-CM

## 2013-01-13 DIAGNOSIS — Z125 Encounter for screening for malignant neoplasm of prostate: Secondary | ICD-10-CM

## 2013-01-13 DIAGNOSIS — J4489 Other specified chronic obstructive pulmonary disease: Secondary | ICD-10-CM

## 2013-01-13 DIAGNOSIS — F17200 Nicotine dependence, unspecified, uncomplicated: Secondary | ICD-10-CM

## 2013-01-13 LAB — LIPID PANEL
HDL: 55.6 mg/dL (ref >39.00)
LDL Cholesterol: 51 mg/dL (ref 0–99)
Total CHOL/HDL Ratio: 2
Triglycerides: 149 mg/dL (ref 0.0–149.0)

## 2013-01-13 LAB — BASIC METABOLIC PANEL
BUN: 19 mg/dL (ref 6–23)
CO2: 34 mEq/L — ABNORMAL HIGH (ref 19–32)
Calcium: 9.4 mg/dL (ref 8.4–10.5)
Chloride: 100 mEq/L (ref 96–112)
Creatinine, Ser: 1 mg/dL (ref 0.4–1.5)
Glucose, Bld: 133 mg/dL — ABNORMAL HIGH (ref 70–99)

## 2013-01-13 LAB — CBC WITH DIFFERENTIAL/PLATELET
Basophils Relative: 0.3 % (ref 0.0–3.0)
HCT: 45.5 % (ref 39.0–52.0)
Hemoglobin: 15.4 g/dL (ref 13.0–17.0)
Lymphocytes Relative: 47.1 % — ABNORMAL HIGH (ref 12.0–46.0)
Lymphs Abs: 3.8 10*3/uL (ref 0.7–4.0)
Monocytes Relative: 9.3 % (ref 3.0–12.0)
Neutro Abs: 3.4 10*3/uL (ref 1.4–7.7)
RBC: 4.65 Mil/uL (ref 4.22–5.81)
RDW: 14 % (ref 11.5–14.6)

## 2013-01-13 LAB — HEPATIC FUNCTION PANEL
AST: 23 U/L (ref 0–37)
Albumin: 4.2 g/dL (ref 3.5–5.2)
Alkaline Phosphatase: 47 U/L (ref 39–117)
Total Bilirubin: 0.8 mg/dL (ref 0.3–1.2)

## 2013-01-13 LAB — POCT URINALYSIS DIPSTICK
Bilirubin, UA: NEGATIVE
Blood, UA: NEGATIVE
Glucose, UA: NEGATIVE
Ketones, UA: NEGATIVE
Spec Grav, UA: 1.02
pH, UA: 5.5

## 2013-01-13 LAB — TSH: TSH: 1.44 u[IU]/mL (ref 0.35–5.50)

## 2013-01-13 LAB — PSA: PSA: 0.93 ng/mL (ref 0.10–4.00)

## 2013-01-20 ENCOUNTER — Encounter: Payer: Self-pay | Admitting: Family Medicine

## 2013-01-20 ENCOUNTER — Ambulatory Visit (INDEPENDENT_AMBULATORY_CARE_PROVIDER_SITE_OTHER): Payer: Medicare Other | Admitting: Family Medicine

## 2013-01-20 VITALS — BP 120/80 | Temp 98.3°F | Ht 74.0 in | Wt 250.0 lb

## 2013-01-20 DIAGNOSIS — Z23 Encounter for immunization: Secondary | ICD-10-CM

## 2013-01-20 DIAGNOSIS — E119 Type 2 diabetes mellitus without complications: Secondary | ICD-10-CM

## 2013-01-20 DIAGNOSIS — Z720 Tobacco use: Secondary | ICD-10-CM

## 2013-01-20 DIAGNOSIS — J449 Chronic obstructive pulmonary disease, unspecified: Secondary | ICD-10-CM

## 2013-01-20 DIAGNOSIS — F17200 Nicotine dependence, unspecified, uncomplicated: Secondary | ICD-10-CM

## 2013-01-20 DIAGNOSIS — E785 Hyperlipidemia, unspecified: Secondary | ICD-10-CM

## 2013-01-20 DIAGNOSIS — F329 Major depressive disorder, single episode, unspecified: Secondary | ICD-10-CM

## 2013-01-20 DIAGNOSIS — Z0000 Encounter for general adult medical examination without abnormal findings: Secondary | ICD-10-CM

## 2013-01-20 DIAGNOSIS — J309 Allergic rhinitis, unspecified: Secondary | ICD-10-CM

## 2013-01-20 DIAGNOSIS — I10 Essential (primary) hypertension: Secondary | ICD-10-CM

## 2013-01-20 DIAGNOSIS — F411 Generalized anxiety disorder: Secondary | ICD-10-CM

## 2013-01-20 MED ORDER — VARENICLINE TARTRATE 1 MG PO TABS: ORAL_TABLET | ORAL | Status: DC

## 2013-01-20 MED ORDER — FLUOCINONIDE-E 0.05 % EX CREA: TOPICAL_CREAM | Freq: Two times a day (BID) | CUTANEOUS | Status: DC

## 2013-01-20 MED ORDER — DIAZEPAM 5 MG PO TABS: ORAL_TABLET | ORAL | Status: DC

## 2013-01-20 MED ORDER — METFORMIN HCL 1000 MG PO TABS: ORAL_TABLET | ORAL | Status: DC

## 2013-01-20 MED ORDER — LOSARTAN POTASSIUM-HCTZ 100-25 MG PO TABS: ORAL_TABLET | ORAL | Status: DC

## 2013-01-20 MED ORDER — ALBUTEROL SULFATE HFA 108 (90 BASE) MCG/ACT IN AERS: 2.0000 | INHALATION_SPRAY | RESPIRATORY_TRACT | Status: DC | PRN

## 2013-01-20 MED ORDER — SIMVASTATIN 40 MG PO TABS: ORAL_TABLET | ORAL | Status: DC

## 2013-01-20 MED ORDER — ESCITALOPRAM OXALATE 20 MG PO TABS: ORAL_TABLET | ORAL | Status: DC

## 2013-01-20 NOTE — Patient Instructions (Signed)
Continue your current medications  Restart the Chantix program 1 mg.......... One half tab twice daily for 1 week then 1 tab twice daily for 1 week then 1 tab 3 times daily starting the third week  Return in 6 weeks for followup  Begin the tapering program as outlined  You also may want to add the nicotine gum or patches

## 2013-01-20 NOTE — Progress Notes (Signed)
Subjective:    Patient ID: Drew Burke, male    DOB: 04/24/1946, 67 y.o.   MRN: [id number]  HPI Mr. Kincaid is a 67 year old single male smoker who comes in today for general physical examination because of a history of diabetes type 2, hyperlipidemia, hypertension, anxiety, depression, COPD, ongoing tobacco abuse he declines a smoking cessation program  His medication reviewed the been no changes his blood sugars under good control in the 120 to 1:30 range blood pressure 120/80. He has no complaints in general except his ongoing back pain.  He had surgery on his back about 10 years ago since then he's been having pain. He says it comes and goes is worse in the morning. He'll take a Vicodin 7.5 mg once or twice daily and that's enough to relieve his pain. He did have to give up golf because it was too painful. We discussed other options including a neurology evaluation to see if there's any other modalities that can be done to help relieve his pain. He declines at this juncture.  Vaccinations up-to-date he was given a Pneumovax today and was told to check with insurance company about the shingles vaccine   Review of Systems  Constitutional: Negative.   HENT: Negative.   Eyes: Negative.   Respiratory: Negative.   Cardiovascular: Negative.   Gastrointestinal: Negative.   Genitourinary: Negative.   Musculoskeletal: Negative.   Skin: Negative.   Neurological: Negative.   Psychiatric/Behavioral: Negative.        Objective:   Physical Exam  Nursing note and vitals reviewed. Constitutional: He is oriented to person, place, and time. He appears well-developed and well-nourished.  Cataract left eye previous corneal transplant  HENT:  Head: Normocephalic and atraumatic.  Right Ear: External ear normal.  Left Ear: External ear normal.  Nose: Nose normal.  Mouth/Throat: Oropharynx is clear and moist.  Eyes: Conjunctivae and EOM are normal. Pupils are equal, round, and reactive to  light.  Neck: Normal range of motion. Neck supple. No JVD present. No tracheal deviation present. No thyromegaly present.  Cardiovascular: Normal rate, regular rhythm, normal heart sounds and intact distal pulses.  Exam reveals no gallop and no friction rub.   No murmur heard. No carotid nor aortic bruits, peripheral pulses 1+ and symmetrical  Pulmonary/Chest: Effort normal. No stridor. No respiratory distress. He has no wheezes. He has no rales. He exhibits no tenderness.  Markedly diminished breath sounds symmetrical no wheezing.,,,,,,,, we sent him for a pulmonary consult with Dr. Timoteo Ace. Dr. Timoteo Ace recommends also that he quit smoking  Abdominal: Soft. Bowel sounds are normal. He exhibits no distension and no mass. There is no tenderness. There is no rebound and no guarding.  Genitourinary: Rectum normal and penis normal. Guaiac negative stool. No penile tenderness.  2+ symmetrical nonnodular  Musculoskeletal: Normal range of motion. He exhibits no edema and no tenderness.  Lymphadenopathy:    He has no cervical adenopathy.  Neurological: He is alert and oriented to person, place, and time. He has normal reflexes. No cranial nerve deficit. He exhibits normal muscle tone.  Skin: Skin is warm and dry. No rash noted. No erythema. No pallor.  Psychiatric: He has a normal mood and affect. His behavior is normal. Judgment and thought content normal.          Assessment & Plan:  Diabetes type 2 at goal continue current therapy  Hypertension at goal continue current therapy hyperlipidemia goal continue current therapy  Depression at goal continue current therapy  Chronic anxiety continue Valium 5 mg twice a day  History of depression continue Lexapro 20 mg daily  COPD again stressed smoking cessation program we'll outline a program and followup in 6 weeks  COPD albuterol when the 2 puffs 3 times a day when necessary  Chronic back pain continue Vicodin 7.5 mg twice a day.

## 2013-01-21 ENCOUNTER — Telehealth: Payer: Self-pay | Admitting: Family Medicine

## 2013-01-21 MED ORDER — ZOSTER VACCINE LIVE 19400 UNT/0.65ML ~~LOC~~ SOLR: 0.6500 mL | Freq: Once | SUBCUTANEOUS | Status: DC

## 2013-01-21 MED ORDER — TRIAMCINOLONE ACETONIDE 0.5 % EX CREA: TOPICAL_CREAM | Freq: Two times a day (BID) | CUTANEOUS | Status: DC

## 2013-01-21 NOTE — Telephone Encounter (Signed)
1. Pt states he was supposed to get a rx for eczema pt did not get.    Pharm: [facility] [address] 2. Pt checked w/ his insurance co about a shingles vac.  They told him to go to the drug store and get b/c it is cheaper. Does he need script for     that? 3. Also Dr Tawanna Cooler advised pt to go to Dr Vonna Kotyk (ophthalmologist) but pt does not know what for. Do you know?

## 2013-01-21 NOTE — Telephone Encounter (Signed)
Spoke with patient.  Prescriptions sent and patient to have a diabetic eye exam.

## 2013-02-21 ENCOUNTER — Encounter: Payer: Self-pay | Admitting: Family

## 2013-02-21 ENCOUNTER — Ambulatory Visit (INDEPENDENT_AMBULATORY_CARE_PROVIDER_SITE_OTHER): Payer: Medicare Other | Admitting: Family

## 2013-02-21 VITALS — BP 140/82 | Temp 97.8°F | Wt 244.0 lb

## 2013-02-21 DIAGNOSIS — R233 Spontaneous ecchymoses: Secondary | ICD-10-CM

## 2013-02-21 NOTE — Progress Notes (Signed)
Subjective:    Patient ID: Drew Burke, male    DOB: 1946-06-17, 67 y.o.   MRN: [id number]  HPI 67 year old white male, nonsmoker, patient of Dr. Tawanna Cooler in today with a red rash around his left eye x3 days. It is no worse than when he woke up 3 days ago. Denies any injury. Denies any facial cleansers, changes in diet, new foods or medications. 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 Sleeps with his Yorkie. The rash is not itchy, painful, swollen. No head injury.   Review of Systems  Constitutional: Negative.   Eyes: Positive for redness. Negative for photophobia, pain, discharge, itching and visual disturbance.       Around the left eye-redness  Respiratory: Negative.   Cardiovascular: Negative.   Musculoskeletal: Negative.   Skin: Positive for color change and rash.       Redness around the left eye  Allergic/Immunologic: Negative.  Negative for environmental allergies and food allergies.  Neurological: Negative.  Negative for headaches.  Psychiatric/Behavioral: Negative.    Past Medical History  Diagnosis Date  . Allergic rhinitis   . Hyperlipidemia   . Hypertension   . Tobacco abuse   . Anxiety   . Depression   . Aneurysm     History   Social History  . Marital Status: Divorced    Spouse Name: N/A    Number of Children: N/A  . Years of Education: N/A   Occupational History  . sales    Social History Main Topics  . Smoking status: Current Every Day Smoker -- 1.00 packs/day    Types: Cigarettes  . Smokeless tobacco: Never Used     Comment: used to smoke 3 ppd - currently smoking 1 ppd - started smoking at age 47.  Marland Kitchen Alcohol Use: Yes  . Drug Use: No  . Sexual Activity: Not on file   Other Topics Concern  . Not on file   Social History Narrative  . No narrative on file    Past Surgical History  Procedure Laterality Date  . Lumbar disc surgery    . Corneal transplant surgery left eye    . Lumbar laminectomy      Family  History  Problem Relation Age of Onset  . Depression Other   . Hypertension Other   . Heart disease Father   . Cancer Mother     intestines    No Known Allergies  Current Outpatient Prescriptions on File Prior to Visit  Medication Sig Dispense Refill  . albuterol (PROVENTIL HFA;VENTOLIN HFA) 108 (90 BASE) MCG/ACT inhaler Inhale 2 puffs into the lungs every 4 (four) hours as needed for wheezing.  1 Inhaler  1  . aspirin 81 MG tablet Take 81 mg by mouth daily.        . diazepam (VALIUM) 5 MG tablet TAKE 1 TABLET BY MOUTH TWICE A DAY  180 tablet  3  . escitalopram (LEXAPRO) 20 MG tablet TAKE 1 TABLET EVERY DAY  100 tablet  3  . fluocinonide-emollient (LIDEX-E) 0.05 % cream Apply topically 2 (two) times daily.  60 g  2  . glucose blood (FREESTYLE LITE) test strip 1 each by Other route daily. Dx 250.00  100 each  12  . HYDROcodone-acetaminophen (NORCO) 7.5-325 MG per tablet TAKE 1 TABLET TWICE A DAY  180 tablet  1  . Lancets (FREESTYLE) lancets 1 each by Other route daily. Dx 250.00  100 each  12  .  losartan-hydrochlorothiazide (HYZAAR) 100-25 MG per tablet TAKE 1 TABLET EVERY DAY  100 tablet  3  . metFORMIN (GLUCOPHAGE) 1000 MG tablet 1 by mouth twice a day  200 tablet  3  . simvastatin (ZOCOR) 40 MG tablet TAKE 1 TABLET AT BEDTIME  100 tablet  3  . triamcinolone cream (KENALOG) 0.5 % Apply topically 2 (two) times daily.  30 g  5  . varenicline (CHANTIX CONTINUING MONTH PAK) 1 MG tablet One tablet twice daily  60 tablet  6  . zoster vaccine live, PF, (ZOSTAVAX) 10960 UNT/0.65ML injection Inject 19,400 Units into the skin once.  1 each  0   No current facility-administered medications on file prior to visit.    BP 140/82  Temp(Src) 97.8 F (36.6 C) (Oral)  Wt 244 lb (110.678 kg)  BMI 31.31 kg/m2chart    Objective:   Physical Exam  Constitutional: He appears well-developed and well-nourished.  HENT:  Right Ear: External ear normal.  Left Ear: External ear normal.  Nose: Nose  normal.  Mouth/Throat: Oropharynx is clear and moist.  Eyes: Conjunctivae and EOM are normal. Pupils are equal, round, and reactive to light.    Cardiovascular: Normal rate, regular rhythm and normal heart sounds.   Pulmonary/Chest: Effort normal and breath sounds normal.          Assessment & Plan:  Assessment: 1. Bruising related to petechial inflammation of the left eye  Plan: Advised that it may take a week to 2 weeks for the redness did completely go away. Patient allows his understanding. Call in 1 week if symptoms are not improving. Consider referral to dermatology if no better.

## 2013-03-03 ENCOUNTER — Ambulatory Visit: Payer: Medicare Other | Admitting: Family Medicine

## 2013-03-08 ENCOUNTER — Other Ambulatory Visit: Payer: Self-pay | Admitting: Family Medicine

## 2013-03-14 ENCOUNTER — Encounter: Payer: Self-pay | Admitting: Family Medicine

## 2013-04-02 ENCOUNTER — Other Ambulatory Visit: Payer: Self-pay | Admitting: Family Medicine

## 2013-04-06 ENCOUNTER — Encounter: Payer: Self-pay | Admitting: Family Medicine

## 2013-04-06 ENCOUNTER — Ambulatory Visit (INDEPENDENT_AMBULATORY_CARE_PROVIDER_SITE_OTHER): Payer: Medicare Other | Admitting: Family Medicine

## 2013-04-06 VITALS — BP 140/80 | Temp 98.3°F | Wt 248.0 lb

## 2013-04-06 DIAGNOSIS — E119 Type 2 diabetes mellitus without complications: Secondary | ICD-10-CM

## 2013-04-06 DIAGNOSIS — Z23 Encounter for immunization: Secondary | ICD-10-CM

## 2013-04-06 DIAGNOSIS — G8929 Other chronic pain: Secondary | ICD-10-CM

## 2013-04-06 DIAGNOSIS — I10 Essential (primary) hypertension: Secondary | ICD-10-CM

## 2013-04-06 DIAGNOSIS — M549 Dorsalgia, unspecified: Secondary | ICD-10-CM

## 2013-04-06 DIAGNOSIS — F17200 Nicotine dependence, unspecified, uncomplicated: Secondary | ICD-10-CM

## 2013-04-06 MED ORDER — HYDROCODONE-ACETAMINOPHEN 7.5-325 MG PO TABS: 1.0000 | ORAL_TABLET | Freq: Three times a day (TID) | ORAL | Status: DC | PRN

## 2013-04-06 NOTE — Patient Instructions (Signed)
Continue your current medications  Try the electronic cigarettes as we discussed  Followup with me in 2 months  I return of pain medication for 2 months. Call the neurosurgical office today and get set up for an evaluation since your pains getting worse

## 2013-04-06 NOTE — Progress Notes (Signed)
  Subjective:    Patient ID: August Luz, male    DOB: 12/22/1945, 67 y.o.   MRN: [id number]  HPI Mr. Sheard is a 67 year old male single smoker who has underlying hypertension diabetes and chronic back pain who comes in today for evaluation  His diabetes he states is under good control although he hasn't checked his blood sugar recently. Last A1c 2 months ago was 7.3%  He walks 15 minutes 3-4 times daily  Weight stable 248.  We've tried the Chantix for smoking cessation. He's now up to a full tablet twice a day and continues to smoke. He states the best thing that worked in the ZE cigarettes. He understands the negative is of hypertension diabetes and continues smoking.  He states his back pain is getting worse. He takes 3 Vicodin in the morning all at one time. He was taking one twice a day. He's had 2 neurosurgical procedures to his back. The first one when he was 67 years old by Dr. Rosalie Doctor. Since his back pain is getting worse I advised him to consult with the neurosurgeons to get a further evaluation He's also had a extensive pulmonary evaluation. He has underlying COPD  A    Review of Systems Review of systems otherwise negative    Objective:   Physical Exam Well-developed well-nourished male in no acute distress vital signs stable he is afebrile       Assessment & Plan:  Diabetes type 2 at goal continue current therapy  Hypertension at goal continue current therapy  Chronic back pain getting worse Will refill his medication for 2 months within that time I asked him to get a neurosurgical reevaluation  Tobacco abuse try the he cigarette program  Followup in 2 months

## 2013-04-07 ENCOUNTER — Telehealth: Payer: Self-pay | Admitting: Family Medicine

## 2013-04-07 DIAGNOSIS — M549 Dorsalgia, unspecified: Secondary | ICD-10-CM

## 2013-04-07 NOTE — Telephone Encounter (Signed)
I will need a referral entered. Thanks!

## 2013-04-07 NOTE — Telephone Encounter (Signed)
Pt called to inquire about his referral to [facility]. He states that Dr. Tawanna Cooler asked him to call and schedule an appt, when he attempted to, they instructed him to obtain a referral and have his records sent over. Please assist.

## 2013-04-08 ENCOUNTER — Other Ambulatory Visit: Payer: Self-pay | Admitting: Internal Medicine

## 2013-04-22 ENCOUNTER — Encounter: Payer: Self-pay | Admitting: Critical Care Medicine

## 2013-04-22 ENCOUNTER — Ambulatory Visit (INDEPENDENT_AMBULATORY_CARE_PROVIDER_SITE_OTHER): Payer: Medicare Other | Admitting: Critical Care Medicine

## 2013-04-22 VITALS — BP 126/70 | HR 64 | Temp 97.4°F | Ht 74.0 in | Wt 246.0 lb

## 2013-04-22 DIAGNOSIS — F17200 Nicotine dependence, unspecified, uncomplicated: Secondary | ICD-10-CM

## 2013-04-22 DIAGNOSIS — Z720 Tobacco use: Secondary | ICD-10-CM

## 2013-04-22 DIAGNOSIS — J449 Chronic obstructive pulmonary disease, unspecified: Secondary | ICD-10-CM

## 2013-04-22 NOTE — Progress Notes (Signed)
Subjective:    Patient ID: Drew Burke, male    DOB: 29-Apr-1946, 67 y.o.   MRN: [id number]  HPI  04/22/2013 Chief Complaint  Patient presents with  . Follow-up    1 year rtn.  Pt c/o prod cough mostly at night with green mucous.  No chest tightness, postnasal drip, SOB.  Notes cough at night with green mucus,  Only one time. No issues during day time.  Smokes 1PPD. Took chantix per pcp. Did not help. Pt denies any significant sore throat, nasal congestion or excess secretions, fever, chills, sweats, unintended weight loss, pleurtic or exertional chest pain, orthopnea PND, or leg swelling Pt denies any increase in rescue therapy over baseline, denies waking up needing it or having any early am or nocturnal exacerbations of coughing/wheezing/or dyspnea. Pt also denies any obvious fluctuation in symptoms with  weather or environmental change or other alleviating or aggravating factors        Past Medical History  Diagnosis Date  . Allergic rhinitis   . Hyperlipidemia   . Hypertension   . Tobacco abuse   . Anxiety   . Depression   . Aneurysm      Family History  Problem Relation Age of Onset  . Depression Other   . Hypertension Other   . Heart disease Father   . Cancer Mother     intestines     History   Social History  . Marital Status: Divorced    Spouse Name: N/A    Number of Children: N/A  . Years of Education: N/A   Occupational History  . sales    Social History Main Topics  . Smoking status: Current Every Day Smoker -- 1.00 packs/day    Types: Cigarettes  . Smokeless tobacco: Never Used     Comment: used to smoke 3 ppd - currently smoking 1 ppd - started smoking at age 33.  Marland Kitchen Alcohol Use: Yes  . Drug Use: No  . Sexual Activity: Not on file   Other Topics Concern  . Not on file   Social History Narrative  . No narrative on file     No Known Allergies   Outpatient Prescriptions Prior to Visit  Medication Sig Dispense Refill  . albuterol  (PROVENTIL HFA;VENTOLIN HFA) 108 (90 BASE) MCG/ACT inhaler Inhale 2 puffs into the lungs every 4 (four) hours as needed for wheezing.  1 Inhaler  1  . aspirin 81 MG tablet Take 81 mg by mouth daily.        . diazepam (VALIUM) 5 MG tablet TAKE 1 TABLET BY MOUTH TWICE A DAY  180 tablet  3  . escitalopram (LEXAPRO) 20 MG tablet TAKE 1 TABLET EVERY DAY  100 tablet  3  . fluocinonide-emollient (LIDEX-E) 0.05 % cream Apply topically 2 (two) times daily.  60 g  2  . FREESTYLE LITE test strip TEST DAILY AS DIRECTED  100 each  0  . HYDROcodone-acetaminophen (NORCO) 7.5-325 MG per tablet Take 1 tablet by mouth every 8 (eight) hours as needed for pain.  180 tablet  0  . Lancets (FREESTYLE) lancets 1 each by Other route daily. Dx 250.00  100 each  12  . losartan-hydrochlorothiazide (HYZAAR) 100-25 MG per tablet TAKE 1 TABLET EVERY DAY  100 tablet  3  . metFORMIN (GLUCOPHAGE) 1000 MG tablet TAKE 1 TABLET BY MOUTH TWICE A DAY  200 tablet  2  . simvastatin (ZOCOR) 40 MG tablet TAKE 1 TABLET AT BEDTIME  100 tablet  3  . triamcinolone cream (KENALOG) 0.5 % Apply topically 2 (two) times daily.  30 g  5  . escitalopram (LEXAPRO) 20 MG tablet TAKE 1 TABLET EVERY DAY  90 tablet  0  . varenicline (CHANTIX CONTINUING MONTH PAK) 1 MG tablet One tablet twice daily  60 tablet  6  . zoster vaccine live, PF, (ZOSTAVAX) 96295 UNT/0.65ML injection Inject 19,400 Units into the skin once.  1 each  0   No facility-administered medications prior to visit.     Review of Systems  Constitutional: Negative for diaphoresis, activity change, appetite change, fatigue and unexpected weight change.  HENT: Negative for congestion, dental problem, ear discharge, facial swelling, hearing loss, mouth sores, nosebleeds, sinus pressure, sneezing, tinnitus, trouble swallowing and voice change.   Eyes: Negative for photophobia, discharge, itching and visual disturbance.  Respiratory: Negative for apnea, choking, chest tightness and stridor.    Cardiovascular: Negative for palpitations and leg swelling.  Gastrointestinal: Negative for nausea, vomiting, abdominal pain, constipation, blood in stool and abdominal distention.  Genitourinary: Negative for dysuria, urgency, frequency, hematuria, flank pain, decreased urine volume and difficulty urinating.  Musculoskeletal: Negative for arthralgias, back pain, gait problem, joint swelling, neck pain and neck stiffness.  Skin: Negative for color change and pallor.  Neurological: Negative for dizziness, tremors, seizures, syncope, speech difficulty, weakness, light-headedness and numbness.  Hematological: Negative for adenopathy. Does not bruise/bleed easily.  Psychiatric/Behavioral: Negative for confusion, sleep disturbance and agitation. The patient is nervous/anxious.        Objective:   Physical Exam  Filed Vitals:   04/22/13 1007  BP: 126/70  Pulse: 64  Temp: 97.4 F (36.3 C)  TempSrc: Oral  Height: 6\' 2"  (1.88 m)  Weight: 246 lb (111.585 kg)  SpO2: 94%    Gen: Pleasant, well-nourished, in no distress,  normal a ENT: No lesions,  mouth clear,  oropharynx clear, no postnasal drip  Neck: No JVD, no TMG, no carotid bruits  Lungs: No use of accessory muscles, no dullness to percussion, distant BS,  Cardiovascular: RRR, heart sounds normal, no murmur or gallops, no peripheral edema  Abdomen: soft and NT, no HSM,  BS normal  Musculoskeletal: No deformities, no cyanosis or clubbing  Neuro: alert, non focal  Skin: Warm, no lesions or rashes   Spiro : FeV1 60% predicted FeV1/FVC 62%        Assessment & Plan:   COPD Gold A Moderate Copd Gold B FeV1 60% predicted Ongoing smoking  Plan >10min smoking cessation counciling given Consider low dose CT Scan lung cancer screening USe e cigs in combination with nicorette minis 4mg  , use 5-6 times daily A referral to behavioral health will be obtained Return 6 months     Updated Medication List Outpatient  Encounter Prescriptions as of 04/22/2013  Medication Sig Dispense Refill  . albuterol (PROVENTIL HFA;VENTOLIN HFA) 108 (90 BASE) MCG/ACT inhaler Inhale 2 puffs into the lungs every 4 (four) hours as needed for wheezing.  1 Inhaler  1  . aspirin 81 MG tablet Take 81 mg by mouth daily.        . diazepam (VALIUM) 5 MG tablet TAKE 1 TABLET BY MOUTH TWICE A DAY  180 tablet  3  . escitalopram (LEXAPRO) 20 MG tablet TAKE 1 TABLET EVERY DAY  100 tablet  3  . fluocinonide-emollient (LIDEX-E) 0.05 % cream Apply topically 2 (two) times daily.  60 g  2  . FREESTYLE LITE test strip TEST DAILY AS DIRECTED  100 each  0  . HYDROcodone-acetaminophen (NORCO) 7.5-325 MG per tablet Take 1 tablet by mouth every 8 (eight) hours as needed for pain.  180 tablet  0  . Lancets (FREESTYLE) lancets 1 each by Other route daily. Dx 250.00  100 each  12  . losartan-hydrochlorothiazide (HYZAAR) 100-25 MG per tablet TAKE 1 TABLET EVERY DAY  100 tablet  3  . metFORMIN (GLUCOPHAGE) 1000 MG tablet TAKE 1 TABLET BY MOUTH TWICE A DAY  200 tablet  2  . simvastatin (ZOCOR) 40 MG tablet TAKE 1 TABLET AT BEDTIME  100 tablet  3  . triamcinolone cream (KENALOG) 0.5 % Apply topically 2 (two) times daily.  30 g  5  . [DISCONTINUED] escitalopram (LEXAPRO) 20 MG tablet TAKE 1 TABLET EVERY DAY  90 tablet  0  . [DISCONTINUED] varenicline (CHANTIX CONTINUING MONTH PAK) 1 MG tablet One tablet twice daily  60 tablet  6  . [DISCONTINUED] zoster vaccine live, PF, (ZOSTAVAX) 16109 UNT/0.65ML injection Inject 19,400 Units into the skin once.  1 each  0   No facility-administered encounter medications on file as of 04/22/2013.

## 2013-04-22 NOTE — Patient Instructions (Signed)
Consider low dose CT Scan lung cancer screening USe e cigs in combination with nicorette minis 4mg  , use 5-6 times daily A referral to behavioral health will be obtained Return 6 months

## 2013-04-23 NOTE — Assessment & Plan Note (Signed)
Moderate Copd Gold B FeV1 60% predicted Ongoing smoking  Plan >48min smoking cessation counciling given Consider low dose CT Scan lung cancer screening USe e cigs in combination with nicorette minis 4mg  , use 5-6 times daily A referral to behavioral health will be obtained Return 6 months

## 2013-05-17 ENCOUNTER — Other Ambulatory Visit: Payer: Self-pay | Admitting: Critical Care Medicine

## 2013-05-18 NOTE — Telephone Encounter (Signed)
Losartan hctz rx was sent to pharm on 01/20/13 # 100 x 3 by Dr. Governor Specking. I have sent rx request back to pharm asking they defer this request to Dr. Tawanna Cooler.

## 2013-06-01 ENCOUNTER — Telehealth: Payer: Self-pay | Admitting: Family Medicine

## 2013-06-01 DIAGNOSIS — G8929 Other chronic pain: Secondary | ICD-10-CM

## 2013-06-01 NOTE — Telephone Encounter (Signed)
I would wait until Dr. Tawanna Cooler gets back.

## 2013-06-01 NOTE — Telephone Encounter (Signed)
Pt needs new rx hydrocodone °

## 2013-06-01 NOTE — Telephone Encounter (Signed)
Okay to fill? 

## 2013-06-03 NOTE — Telephone Encounter (Signed)
Pt called back to ask about refill and is aware that he will have to wait. Pt aware this is an early refill request, not due til 06/06/13

## 2013-06-06 ENCOUNTER — Ambulatory Visit: Payer: Medicare Other | Admitting: Family Medicine

## 2013-06-07 NOTE — Telephone Encounter (Signed)
Okay to fill per Dr Tawanna Cooler.  Will print when he returns

## 2013-06-08 ENCOUNTER — Encounter: Payer: Self-pay | Admitting: *Deleted

## 2013-06-08 MED ORDER — HYDROCODONE-ACETAMINOPHEN 7.5-325 MG PO TABS: 1.0000 | ORAL_TABLET | Freq: Three times a day (TID) | ORAL | Status: DC | PRN

## 2013-06-08 NOTE — Telephone Encounter (Signed)
Rx ready for pick up.  Patient is aware that he should go to the lab.  Patient will receive Rx at office visit.

## 2013-06-09 ENCOUNTER — Ambulatory Visit (INDEPENDENT_AMBULATORY_CARE_PROVIDER_SITE_OTHER): Payer: Medicare Other | Admitting: Family Medicine

## 2013-06-09 ENCOUNTER — Encounter: Payer: Self-pay | Admitting: Family Medicine

## 2013-06-09 VITALS — BP 128/72 | Temp 97.4°F | Wt 243.0 lb

## 2013-06-09 DIAGNOSIS — M549 Dorsalgia, unspecified: Secondary | ICD-10-CM

## 2013-06-09 DIAGNOSIS — I10 Essential (primary) hypertension: Secondary | ICD-10-CM

## 2013-06-09 DIAGNOSIS — F17200 Nicotine dependence, unspecified, uncomplicated: Secondary | ICD-10-CM

## 2013-06-09 DIAGNOSIS — J449 Chronic obstructive pulmonary disease, unspecified: Secondary | ICD-10-CM

## 2013-06-09 DIAGNOSIS — G8929 Other chronic pain: Secondary | ICD-10-CM

## 2013-06-09 DIAGNOSIS — E119 Type 2 diabetes mellitus without complications: Secondary | ICD-10-CM

## 2013-06-09 DIAGNOSIS — J4489 Other specified chronic obstructive pulmonary disease: Secondary | ICD-10-CM

## 2013-06-09 LAB — BASIC METABOLIC PANEL
BUN: 16 mg/dL (ref 6–23)
Calcium: 9.8 mg/dL (ref 8.4–10.5)
Creatinine, Ser: 0.8 mg/dL (ref 0.4–1.5)
GFR: 96.85 mL/min (ref >60.00)
Potassium: 4.5 mEq/L (ref 3.5–5.1)

## 2013-06-09 LAB — MICROALBUMIN / CREATININE URINE RATIO
Creatinine,U: 30.1 mg/dL
Microalb Creat Ratio: 2.3 mg/g (ref 0.0–30.0)
Microalb, Ur: 0.7 mg/dL (ref 0.0–1.9)

## 2013-06-09 MED ORDER — VARENICLINE TARTRATE 1 MG PO TABS: ORAL_TABLET | ORAL | Status: DC

## 2013-06-09 NOTE — Progress Notes (Signed)
   Subjective:    Patient ID: Drew Burke, male    DOB: 07/30/1945, 68 y.o.   MRN: [id number]  HPI Drew Burke is a 67 year old male who comes in today for evaluation of 3 issues  1 losartan 100-25 his blood pressure is normal 120/72 no side effects to medication  He takes metformin 1000 mg twice a day but does not check his blood sugar. We'll check blood sugar and A1c today  He continues to smoke a pack of cigarettes a day and has a history of underlying COPD and recently saw Dr. Dennie Bible.  His oxygen level is falling and Dr. Delford Field in cystoscopy but he quit smoking. He still smoking he says the Chantix program didn't help but he is willing to try again   Review of Systems Negative    Objective:   Physical Exam  Well-developed well-nourished male no acute distress smells of tobacco BP 120/72      Assessment & Plan:  Diabetes type 2 check labs  Hypertension at goal continue current therapy note #3  Tobacco abuse begin smoking cessation program followup in 6 weeks

## 2013-06-09 NOTE — Progress Notes (Signed)
Pt informed

## 2013-06-09 NOTE — Patient Instructions (Signed)
Labs today and I will call you the report  Begin the Chantix program......... it's a 1 mg tablet................ take a half a tablet a day for one week then increase to a half a tablet twice daily  Taper your cigarettes by 1 per week  Return in 6 weeks for followup

## 2013-06-09 NOTE — Progress Notes (Signed)
Pre visit review using our clinic review tool, if applicable. No additional management support is needed unless otherwise documented below in the visit note. 

## 2013-06-20 ENCOUNTER — Other Ambulatory Visit: Payer: Self-pay | Admitting: Critical Care Medicine

## 2013-06-21 NOTE — Telephone Encounter (Signed)
Hyzaar 100/25 qd rx [facility] # 100x3 by Dr. Governor Specking.    I have declined rx request asking to pls defer questions or further rxs to Dr. Nelida Meuse office.

## 2013-07-10 ENCOUNTER — Other Ambulatory Visit: Payer: Self-pay | Admitting: Family Medicine

## 2013-07-21 ENCOUNTER — Encounter: Payer: Self-pay | Admitting: Family Medicine

## 2013-07-21 ENCOUNTER — Ambulatory Visit (INDEPENDENT_AMBULATORY_CARE_PROVIDER_SITE_OTHER): Payer: Medicare Other | Admitting: Family Medicine

## 2013-07-21 ENCOUNTER — Other Ambulatory Visit: Payer: Self-pay | Admitting: Family Medicine

## 2013-07-21 VITALS — BP 110/74 | HR 62 | Temp 98.3°F | Wt 249.0 lb

## 2013-07-21 DIAGNOSIS — M549 Dorsalgia, unspecified: Secondary | ICD-10-CM

## 2013-07-21 DIAGNOSIS — F411 Generalized anxiety disorder: Secondary | ICD-10-CM

## 2013-07-21 DIAGNOSIS — G8929 Other chronic pain: Secondary | ICD-10-CM

## 2013-07-21 DIAGNOSIS — F17200 Nicotine dependence, unspecified, uncomplicated: Secondary | ICD-10-CM

## 2013-07-21 NOTE — Patient Instructions (Signed)
6 restart the Chantix one half tab daily for 1 week then one half tab twice daily the second week then one in the morning and a half a tab the third week then the fourth week increase to a full tablet twice daily  Begin a tapering program,,,,,,,,,, decreased by 2 per week  Take the Valium twice daily  We will set you up a consult with Dr. Hardin Negus at the pain center  Return to see me in one month for followup

## 2013-07-21 NOTE — Progress Notes (Signed)
   Subjective:    Patient ID: Drew Burke, male    DOB: 1945-08-13, 68 y.o.   MRN: [id number]  HPI  [name] is a 68 year old male single smoker,,,, 15 cigarettes a day,,,,,,,, who comes in today for evaluation tobacco abuse anxiety and chronic back pain  We started him on the Chantix program. He got up to 1 mg a day and quit 3 weeks ago because it didn't seem to help. He may require more medication  He has chronic anxiety and takes Valium 5 mg twice a day when necessary. Advised to take it daily to see if that'll help with the smoking cessation  He's got chronic back pain from degenerative disc disease obviously exacerbated by the smoking this seems to be getting worse and is taking a fair amount of Vicodin 3-4 per day it is not helping and he says his pain is now getting worse    Review of Systems    review of systems negative Objective:   Physical Exam Well-developed well nourished male no acute distress vital signs stable he is afebrile       Assessment & Plan:  Tobacco abuse,,,,,,,, restart Chantix increase dose slowly to 1 mg twice a day add Valium twice a day daily

## 2013-07-22 ENCOUNTER — Telehealth: Payer: Self-pay | Admitting: Family Medicine

## 2013-07-22 NOTE — Telephone Encounter (Signed)
Relevant patient education assigned to patient using Emmi. ° °

## 2013-07-25 ENCOUNTER — Ambulatory Visit: Payer: Medicare Other | Admitting: Family Medicine

## 2013-08-08 ENCOUNTER — Telehealth: Payer: Self-pay | Admitting: Family Medicine

## 2013-08-08 DIAGNOSIS — M549 Dorsalgia, unspecified: Principal | ICD-10-CM

## 2013-08-08 DIAGNOSIS — G8929 Other chronic pain: Secondary | ICD-10-CM

## 2013-08-08 MED ORDER — HYDROCODONE-ACETAMINOPHEN 7.5-325 MG PO TABS: 1.0000 | ORAL_TABLET | Freq: Three times a day (TID) | ORAL | Status: DC | PRN

## 2013-08-08 NOTE — Telephone Encounter (Signed)
Pt is requesting new rx hydrocodone please call to confirm when ready for p/u

## 2013-08-08 NOTE — Telephone Encounter (Signed)
Rx ready for pick up and patient is aware 

## 2013-08-22 ENCOUNTER — Ambulatory Visit: Payer: Medicare Other | Admitting: Family Medicine

## 2013-08-25 ENCOUNTER — Other Ambulatory Visit: Payer: Self-pay | Admitting: Family Medicine

## 2013-09-06 ENCOUNTER — Encounter: Payer: Self-pay | Admitting: Family Medicine

## 2013-09-06 ENCOUNTER — Telehealth: Payer: Self-pay | Admitting: Family Medicine

## 2013-09-06 ENCOUNTER — Ambulatory Visit (INDEPENDENT_AMBULATORY_CARE_PROVIDER_SITE_OTHER): Payer: Medicare Other | Admitting: Family Medicine

## 2013-09-06 VITALS — BP 130/90 | Temp 98.2°F | Wt 246.0 lb

## 2013-09-06 DIAGNOSIS — F17200 Nicotine dependence, unspecified, uncomplicated: Secondary | ICD-10-CM

## 2013-09-06 DIAGNOSIS — J449 Chronic obstructive pulmonary disease, unspecified: Secondary | ICD-10-CM

## 2013-09-06 DIAGNOSIS — J4489 Other specified chronic obstructive pulmonary disease: Secondary | ICD-10-CM

## 2013-09-06 NOTE — Progress Notes (Signed)
   Subjective:    Patient ID: Drew Burke, male    DOB: 06-15-1946, 68 y.o.   MRN: [id number]  HPI Drew Burke is a 68 year old single male smoker but down to 10 cigarettes daily on the electronic cigarettes. He said he tried the Chantix and worked up to a full milligram but didn't seem to help. He has underlying COPD goal being and understands the negative impact of continuing smoking lung cancer etc. etc.     Review of Systems Negative    Objective:   Physical Exam  Well-developed well-nourished in no acute distress vital signs stable he is afebrile      Assessment & Plan:  Tobacco abuse ongoing with underlying COPD.....Marland Kitchen continue electronic cigarettes taper by one per week followup in 2 months

## 2013-09-06 NOTE — Patient Instructions (Signed)
Restart the Chantix one half tab daily then after 2 weeks increase it to one half tab twice daily  Continue the electronic cigarettes taper  Taper by one per week  Followup in one month

## 2013-09-06 NOTE — Progress Notes (Signed)
Pre visit review using our clinic review tool, if applicable. No additional management support is needed unless otherwise documented below in the visit note. 

## 2013-09-06 NOTE — Telephone Encounter (Signed)
Relevant patient education assigned to patient using Emmi. ° °

## 2013-09-07 ENCOUNTER — Telehealth: Payer: Self-pay | Admitting: Family Medicine

## 2013-09-07 NOTE — Telephone Encounter (Signed)
Relevant patient education assigned to patient using Emmi. ° °

## 2013-10-04 ENCOUNTER — Telehealth: Payer: Self-pay | Admitting: Family Medicine

## 2013-10-04 DIAGNOSIS — M549 Dorsalgia, unspecified: Principal | ICD-10-CM

## 2013-10-04 DIAGNOSIS — G8929 Other chronic pain: Secondary | ICD-10-CM

## 2013-10-04 NOTE — Telephone Encounter (Signed)
Pt is needing a new rx for HYDROcodone-acetaminophen (NORCO) 7.5-325 MG per tablet, please call when available for pick up.

## 2013-10-06 MED ORDER — HYDROCODONE-ACETAMINOPHEN 7.5-325 MG PO TABS: 1.0000 | ORAL_TABLET | Freq: Three times a day (TID) | ORAL | Status: DC | PRN

## 2013-10-06 NOTE — Telephone Encounter (Signed)
Rx ready for pick up and patient is aware 

## 2013-10-13 ENCOUNTER — Ambulatory Visit: Payer: Medicare Other | Admitting: Family Medicine

## 2013-10-19 ENCOUNTER — Other Ambulatory Visit: Payer: Self-pay | Admitting: Family Medicine

## 2013-10-27 ENCOUNTER — Ambulatory Visit: Payer: Medicare Other | Admitting: Family Medicine

## 2013-11-03 ENCOUNTER — Encounter: Payer: Self-pay | Admitting: Family Medicine

## 2013-11-03 ENCOUNTER — Ambulatory Visit (INDEPENDENT_AMBULATORY_CARE_PROVIDER_SITE_OTHER): Payer: Medicare Other | Admitting: Family Medicine

## 2013-11-03 VITALS — BP 120/80 | Temp 98.1°F | Wt 233.0 lb

## 2013-11-03 DIAGNOSIS — J449 Chronic obstructive pulmonary disease, unspecified: Secondary | ICD-10-CM

## 2013-11-03 DIAGNOSIS — F17200 Nicotine dependence, unspecified, uncomplicated: Secondary | ICD-10-CM

## 2013-11-03 MED ORDER — CHLORDIAZEPOXIDE HCL 10 MG PO CAPS: ORAL_CAPSULE | ORAL | Status: DC

## 2013-11-03 NOTE — Progress Notes (Signed)
   Subjective:    Patient ID: Drew Burke, male    DOB: 06-13-1946, 68 y.o.   MRN: [id number]  HPI Drew Burke is a 68 year old single male smoker now off cigarettes but used in the electronic cigarettes.... who comes in today for followup. We are trying to find a way to wean him off tobacco completely because he has underlying COPD. .......Marland Kitchen   Review of Systems  review of systems otherwise negative except for no decrease in nicotine consumption in the past month     Objective:   Physical Exam well-developed well nourished male no acute distress vital signs stable he is afebrile       Assessment &Nicotine addiction...Marland KitchenMarland KitchenMarland Kitchen nicotine addiction Plan:  ....... nicotine addiction.........Marland Kitchen DC Valium,,,,,,,, begin Librium low-dose Chantix

## 2013-11-03 NOTE — Patient Instructions (Signed)
Stop the Valium  Librium 10 mg..............Marland Kitchen 13 times daily  Chantix 0.5 mg.........Marland Kitchen Monday Wednesday Friday  Return in one month for followup

## 2013-11-03 NOTE — Progress Notes (Signed)
Pre visit review using our clinic review tool, if applicable. No additional management support is needed unless otherwise documented below in the visit note. 

## 2013-12-05 ENCOUNTER — Ambulatory Visit: Payer: Medicare Other | Admitting: Family Medicine

## 2013-12-05 ENCOUNTER — Telehealth: Payer: Self-pay | Admitting: Family Medicine

## 2013-12-05 DIAGNOSIS — M549 Dorsalgia, unspecified: Principal | ICD-10-CM

## 2013-12-05 DIAGNOSIS — G8929 Other chronic pain: Secondary | ICD-10-CM

## 2013-12-05 MED ORDER — HYDROCODONE-ACETAMINOPHEN 7.5-325 MG PO TABS: 1.0000 | ORAL_TABLET | Freq: Three times a day (TID) | ORAL | Status: DC | PRN

## 2013-12-05 NOTE — Telephone Encounter (Signed)
Rx ready for pick up and Left message on machine for patient   

## 2013-12-05 NOTE — Telephone Encounter (Signed)
Pt req rx on HYDROcodone-acetaminophen (NORCO) 7.5-325 MG per tablet

## 2013-12-15 ENCOUNTER — Ambulatory Visit: Payer: Medicare Other | Admitting: Family Medicine

## 2013-12-26 ENCOUNTER — Ambulatory Visit (INDEPENDENT_AMBULATORY_CARE_PROVIDER_SITE_OTHER): Payer: Medicare Other | Admitting: Family Medicine

## 2013-12-26 ENCOUNTER — Encounter: Payer: Self-pay | Admitting: Family Medicine

## 2013-12-26 VITALS — BP 110/70 | Temp 98.6°F | Wt 232.0 lb

## 2013-12-26 DIAGNOSIS — F17200 Nicotine dependence, unspecified, uncomplicated: Secondary | ICD-10-CM

## 2013-12-26 DIAGNOSIS — F411 Generalized anxiety disorder: Secondary | ICD-10-CM

## 2013-12-26 MED ORDER — DIAZEPAM 5 MG PO TABS: ORAL_TABLET | ORAL | Status: DC

## 2013-12-26 NOTE — Patient Instructions (Signed)
Valium 5 mg,,,,,,,,,,,,,,,,, one twice daily,,,,,,,,,,,,,,,, a third pill in the afternoon when necessary  Taper by one per week  Followup in 2 months,

## 2013-12-26 NOTE — Progress Notes (Signed)
   Subjective:    Patient ID: Drew Burke, male    DOB: August 10, 1945, 68 y.o.   MRN: [id number]  HPI [place]os is a 68 year old male who comes in today for followup of smoking cessation  We've been working with him heart heart heart over the last couple months to try to get him off nicotine. He has underlying lung disease. He stopped the Chantix yesterday because it gave her nightmares. He then tried cut dose back to 0.5 mg 2-3 times weekly. He's currently spoken 15-20 cigarettes a day.  He would like to go back on the Valium he thinks that helped him better than the Librium   Review of Systems Review of systems otherwise negative    Objective:   Physical Exam  Well-developed well nourished male no acute distress vital signs stable he is afebrile      Assessment & Plan:  Tobacco abuse,,,,,,,,,, on the Chantix because of side effects,,,,,,,,,,,,,,,,

## 2014-01-26 ENCOUNTER — Other Ambulatory Visit: Payer: Self-pay | Admitting: Family Medicine

## 2014-02-02 ENCOUNTER — Telehealth: Payer: Self-pay | Admitting: Family Medicine

## 2014-02-02 DIAGNOSIS — G8929 Other chronic pain: Secondary | ICD-10-CM

## 2014-02-02 DIAGNOSIS — M549 Dorsalgia, unspecified: Principal | ICD-10-CM

## 2014-02-02 NOTE — Telephone Encounter (Signed)
Pt request refill of the following: HYDROcodone-acetaminophen (NORCO) 7.5-325 MG per tablet ° ° ° °Phamacy: °   Pick up  °

## 2014-02-03 MED ORDER — HYDROCODONE-ACETAMINOPHEN 7.5-325 MG PO TABS: 1.0000 | ORAL_TABLET | Freq: Three times a day (TID) | ORAL | Status: DC | PRN

## 2014-02-03 NOTE — Telephone Encounter (Signed)
rx ready for pick up and Left message on machine for patient 

## 2014-02-10 ENCOUNTER — Other Ambulatory Visit: Payer: Self-pay | Admitting: Family Medicine

## 2014-02-10 DIAGNOSIS — E785 Hyperlipidemia, unspecified: Secondary | ICD-10-CM

## 2014-02-10 DIAGNOSIS — E119 Type 2 diabetes mellitus without complications: Secondary | ICD-10-CM

## 2014-02-27 ENCOUNTER — Ambulatory Visit: Payer: Medicare Other | Admitting: Family Medicine

## 2014-03-01 ENCOUNTER — Other Ambulatory Visit: Payer: Self-pay | Admitting: Family Medicine

## 2014-03-03 ENCOUNTER — Encounter: Payer: Self-pay | Admitting: Internal Medicine

## 2014-03-03 ENCOUNTER — Encounter: Payer: Self-pay | Admitting: Family Medicine

## 2014-03-03 ENCOUNTER — Ambulatory Visit (INDEPENDENT_AMBULATORY_CARE_PROVIDER_SITE_OTHER): Payer: Medicare Other | Admitting: Family Medicine

## 2014-03-03 ENCOUNTER — Telehealth: Payer: Self-pay | Admitting: Family Medicine

## 2014-03-03 VITALS — BP 120/80 | HR 64 | Temp 99.3°F | Wt 237.0 lb

## 2014-03-03 DIAGNOSIS — Z23 Encounter for immunization: Secondary | ICD-10-CM

## 2014-03-03 DIAGNOSIS — E785 Hyperlipidemia, unspecified: Secondary | ICD-10-CM

## 2014-03-03 DIAGNOSIS — F17200 Nicotine dependence, unspecified, uncomplicated: Secondary | ICD-10-CM

## 2014-03-03 DIAGNOSIS — E119 Type 2 diabetes mellitus without complications: Secondary | ICD-10-CM

## 2014-03-03 LAB — LIPID PANEL
CHOLESTEROL: 126 mg/dL (ref 0–200)
HDL: 49.7 mg/dL (ref >39.00)
LDL Cholesterol: 56 mg/dL (ref 0–99)
NonHDL: 76.3
Total CHOL/HDL Ratio: 3
Triglycerides: 100 mg/dL (ref 0.0–149.0)
VLDL: 20 mg/dL (ref 0.0–40.0)

## 2014-03-03 LAB — BASIC METABOLIC PANEL
BUN: 14 mg/dL (ref 6–23)
CALCIUM: 9.7 mg/dL (ref 8.4–10.5)
CO2: 30 mEq/L (ref 19–32)
CREATININE: 0.9 mg/dL (ref 0.4–1.5)
Chloride: 103 mEq/L (ref 96–112)
GFR: 95.32 mL/min (ref >60.00)
Glucose, Bld: 89 mg/dL (ref 70–99)
Potassium: 4.1 mEq/L (ref 3.5–5.1)
SODIUM: 143 meq/L (ref 135–145)

## 2014-03-03 LAB — MICROALBUMIN / CREATININE URINE RATIO
Creatinine,U: 26.5 mg/dL
Microalb Creat Ratio: 4.1 mg/g (ref 0.0–30.0)
Microalb, Ur: 1.1 mg/dL (ref 0.0–1.9)

## 2014-03-03 LAB — HEMOGLOBIN A1C: Hgb A1c MFr Bld: 6.7 % — ABNORMAL HIGH (ref 4.6–6.5)

## 2014-03-03 MED ORDER — GLUCOSE BLOOD VI STRP: ORAL_STRIP | Status: DC

## 2014-03-03 MED ORDER — ACCU-CHEK SOFTCLIX LANCETS MISC: Status: DC

## 2014-03-03 NOTE — Telephone Encounter (Signed)
Ben from [facility] s/w pt and the meter free style is not covered by insurance.    Accu-check and One touch is covered by his insurance  Brand for  accu-check; nano smartview, aviva plus  One touch- ultra two, ultra mini, verio sync,verio iq     [facility] [address]

## 2014-03-03 NOTE — Progress Notes (Signed)
Pre visit review using our clinic review tool, if applicable. No additional management support is needed unless otherwise documented below in the visit note. Lab Results  Component Value Date   HGBA1C 6.8* 06/09/2013   HGBA1C 7.3* 01/13/2013   HGBA1C 6.9* 10/21/2012   Lab Results  Component Value Date   MICROALBUR 0.7 06/09/2013   LDLCALC 51 01/13/2013   CREATININE 0.8 06/09/2013

## 2014-03-03 NOTE — Patient Instructions (Signed)
Taper by 2 per week...............  Continue current medications  I will call you I gets her lab work back  Set up an appointment in November.......... 30 minutes........ for general physical exam

## 2014-03-03 NOTE — Progress Notes (Signed)
   Subjective:    Patient ID: Drew Burke, male    DOB: 04-29-1946, 68 y.o.   MRN: [id number]  HPI Drew Burke is a 68 year old single male smoker......... now up to 15 cigarettes a day since his dog died........ who comes in today for evaluation of diabetes  We saw him last November for general physical examination. I metformin 1 g twice a day blood sugar normal A1c 6.8%.  Blood pressure goal 120/80  Lipids are at goal  He was tapering his cigarettes but now has increased his smoking since his dog died   Review of Systems Review of systems otherwise negative    Objective:   Physical Exam Well-developed and nourished male no acute distress vital signs stable he is afebrile BP 120/80       Assessment & Plan:  Diabetes type 2 at goal......... check labs  Hypertension at goal......... continue current therapy  Hyperlipidemia goal.......... continue statin  Tobacco abuse............Marland Kitchen re\re outlined smoking cessation program..

## 2014-03-14 ENCOUNTER — Other Ambulatory Visit: Payer: Self-pay | Admitting: Family Medicine

## 2014-04-04 ENCOUNTER — Telehealth: Payer: Self-pay | Admitting: Family Medicine

## 2014-04-04 DIAGNOSIS — G8929 Other chronic pain: Secondary | ICD-10-CM

## 2014-04-04 DIAGNOSIS — M549 Dorsalgia, unspecified: Principal | ICD-10-CM

## 2014-04-04 MED ORDER — HYDROCODONE-ACETAMINOPHEN 7.5-325 MG PO TABS: 1.0000 | ORAL_TABLET | Freq: Three times a day (TID) | ORAL | Status: DC | PRN

## 2014-04-04 NOTE — Telephone Encounter (Signed)
Rx ready for pick up and patient is aware 

## 2014-04-04 NOTE — Telephone Encounter (Signed)
Pt request refill HYDROcodone-acetaminophen (NORCO) 7.5-325 MG per tablet °

## 2014-05-04 ENCOUNTER — Other Ambulatory Visit (INDEPENDENT_AMBULATORY_CARE_PROVIDER_SITE_OTHER): Payer: Medicare Other

## 2014-05-04 ENCOUNTER — Other Ambulatory Visit: Payer: Medicare Other

## 2014-05-04 DIAGNOSIS — R7989 Other specified abnormal findings of blood chemistry: Secondary | ICD-10-CM

## 2014-05-04 DIAGNOSIS — E119 Type 2 diabetes mellitus without complications: Secondary | ICD-10-CM

## 2014-05-04 DIAGNOSIS — F17200 Nicotine dependence, unspecified, uncomplicated: Secondary | ICD-10-CM

## 2014-05-04 DIAGNOSIS — Z720 Tobacco use: Secondary | ICD-10-CM

## 2014-05-04 DIAGNOSIS — Z125 Encounter for screening for malignant neoplasm of prostate: Secondary | ICD-10-CM

## 2014-05-04 LAB — CBC WITH DIFFERENTIAL/PLATELET
Basophils Absolute: 0 10*3/uL (ref 0.0–0.1)
Basophils Relative: 0.4 % (ref 0.0–3.0)
EOS PCT: 1.6 % (ref 0.0–5.0)
Eosinophils Absolute: 0.1 10*3/uL (ref 0.0–0.7)
HCT: 45 % (ref 39.0–52.0)
Hemoglobin: 14.7 g/dL (ref 13.0–17.0)
Lymphs Abs: 4.8 10*3/uL — ABNORMAL HIGH (ref 0.7–4.0)
MCHC: 32.8 g/dL (ref 30.0–36.0)
MCV: 98.7 fl (ref 78.0–100.0)
MONO ABS: 0.6 10*3/uL (ref 0.1–1.0)
MONOS PCT: 8.2 % (ref 3.0–12.0)
Neutro Abs: 2.3 10*3/uL (ref 1.4–7.7)
Neutrophils Relative %: 29.2 % — ABNORMAL LOW (ref 43.0–77.0)
PLATELETS: 156 10*3/uL (ref 150.0–400.0)
RBC: 4.56 Mil/uL (ref 4.22–5.81)
RDW: 13.4 % (ref 11.5–15.5)
WBC: 7.9 10*3/uL (ref 4.0–10.5)

## 2014-05-04 LAB — HEPATIC FUNCTION PANEL
ALBUMIN: 3.6 g/dL (ref 3.5–5.2)
ALT: 24 U/L (ref 0–53)
AST: 21 U/L (ref 0–37)
Alkaline Phosphatase: 42 U/L (ref 39–117)
Bilirubin, Direct: 0.1 mg/dL (ref 0.0–0.3)
TOTAL PROTEIN: 6.1 g/dL (ref 6.0–8.3)
Total Bilirubin: 0.8 mg/dL (ref 0.2–1.2)

## 2014-05-04 LAB — TSH: TSH: 0.91 u[IU]/mL (ref 0.35–4.50)

## 2014-05-04 LAB — PSA: PSA: 0.82 ng/mL (ref 0.10–4.00)

## 2014-05-05 LAB — PATHOLOGIST SMEAR REVIEW

## 2014-05-09 ENCOUNTER — Encounter: Payer: Medicare Other | Admitting: Internal Medicine

## 2014-05-11 ENCOUNTER — Encounter: Payer: Self-pay | Admitting: Family Medicine

## 2014-05-11 ENCOUNTER — Ambulatory Visit (INDEPENDENT_AMBULATORY_CARE_PROVIDER_SITE_OTHER): Payer: Medicare Other | Admitting: Family Medicine

## 2014-05-11 VITALS — BP 110/80 | Temp 98.1°F | Ht 74.0 in | Wt 237.0 lb

## 2014-05-11 DIAGNOSIS — E785 Hyperlipidemia, unspecified: Secondary | ICD-10-CM

## 2014-05-11 DIAGNOSIS — Z720 Tobacco use: Secondary | ICD-10-CM

## 2014-05-11 DIAGNOSIS — Z23 Encounter for immunization: Secondary | ICD-10-CM

## 2014-05-11 DIAGNOSIS — G8929 Other chronic pain: Secondary | ICD-10-CM

## 2014-05-11 DIAGNOSIS — E7800 Pure hypercholesterolemia, unspecified: Secondary | ICD-10-CM

## 2014-05-11 DIAGNOSIS — F17200 Nicotine dependence, unspecified, uncomplicated: Secondary | ICD-10-CM

## 2014-05-11 DIAGNOSIS — F411 Generalized anxiety disorder: Secondary | ICD-10-CM

## 2014-05-11 DIAGNOSIS — I10 Essential (primary) hypertension: Secondary | ICD-10-CM

## 2014-05-11 DIAGNOSIS — J4520 Mild intermittent asthma, uncomplicated: Secondary | ICD-10-CM

## 2014-05-11 DIAGNOSIS — F418 Other specified anxiety disorders: Secondary | ICD-10-CM

## 2014-05-11 DIAGNOSIS — J301 Allergic rhinitis due to pollen: Secondary | ICD-10-CM

## 2014-05-11 DIAGNOSIS — M549 Dorsalgia, unspecified: Secondary | ICD-10-CM

## 2014-05-11 DIAGNOSIS — E119 Type 2 diabetes mellitus without complications: Secondary | ICD-10-CM

## 2014-05-11 MED ORDER — LOSARTAN POTASSIUM-HCTZ 100-25 MG PO TABS: ORAL_TABLET | ORAL | Status: DC

## 2014-05-11 MED ORDER — METFORMIN HCL 1000 MG PO TABS: ORAL_TABLET | ORAL | Status: DC

## 2014-05-11 MED ORDER — DIAZEPAM 5 MG PO TABS
ORAL_TABLET | ORAL | Status: DC
Start: 2014-05-11 — End: 2014-12-18

## 2014-05-11 MED ORDER — HYDROCODONE-ACETAMINOPHEN 7.5-325 MG PO TABS: 1.0000 | ORAL_TABLET | Freq: Three times a day (TID) | ORAL | Status: DC | PRN

## 2014-05-11 MED ORDER — ALBUTEROL SULFATE HFA 108 (90 BASE) MCG/ACT IN AERS: 2.0000 | INHALATION_SPRAY | RESPIRATORY_TRACT | Status: DC | PRN

## 2014-05-11 MED ORDER — ESCITALOPRAM OXALATE 20 MG PO TABS: ORAL_TABLET | ORAL | Status: DC

## 2014-05-11 MED ORDER — SIMVASTATIN 40 MG PO TABS: ORAL_TABLET | ORAL | Status: DC

## 2014-05-11 NOTE — Patient Instructions (Signed)
Continue current medications  We will set you up a consult and pulmonary to see if they can help you stop smoking and to evaluate your lung function  Return in one year sooner if any problems

## 2014-05-11 NOTE — Progress Notes (Signed)
Pre visit review using our clinic review tool, if applicable. No additional management support is needed unless otherwise documented below in the visit note. Lab Results  Component Value Date   HGBA1C 6.7* 03/03/2014   HGBA1C 6.8* 06/09/2013   HGBA1C 7.3* 01/13/2013   Lab Results  Component Value Date   MICROALBUR 1.1 03/03/2014   LDLCALC 56 03/03/2014   CREATININE 0.9 03/03/2014

## 2014-05-11 NOTE — Progress Notes (Signed)
Subjective:    Patient ID: Drew Burke, male    DOB: [birth year], 68 y.o.   MRN: [id number]  HPI Alvester Chou is a 68 year old divorced male smoker one pack a day for 40+ years,,,,,,, who does not wish to quit smoking,,,, we've tried everything from patches comes [employer] . Etc. And nothing is helped. He has underlying ADD  He takes Valium 5 mg 3 times daily for anxiety, Lexapro 20 mg daily at bedtime for depression, narco 7. 11/07/2023 3 times a day when necessary for chronic back pain, Hyzaar 100-25 daily for hypertension BP 120/80  Metformin 1000 mg twice a day for diabetes  Zocor 40 mg daily along with an aspirin tablet for hyperlipidemia.  He has underlying COPD from the chronic tobacco abuse uses albuterol when necessary  Vaccinations updated by Apolonio Schneiders  He gets routine eye care, dental care, colonoscopy due encouraged to get a follow-up colonoscopy  Cognitive function normal he walks on a regular basis home health safety reviewed no issues identified, no guns in the house, he does have a healthcare power of attorney and living well   Review of Systems  Constitutional: Negative.   HENT: Negative.   Eyes: Negative.   Respiratory: Negative.   Cardiovascular: Negative.   Gastrointestinal: Negative.   Endocrine: Negative.   Genitourinary: Negative.   Musculoskeletal: Negative.   Skin: Negative.   Allergic/Immunologic: Negative.   Neurological: Negative.   Hematological: Negative.   Psychiatric/Behavioral: Negative.        Objective:   Physical Exam  Constitutional: He is oriented to person, place, and time. He appears well-developed and well-nourished.  HENT:  Head: Normocephalic and atraumatic.  Right Ear: External ear normal.  Left Ear: External ear normal.  Nose: Nose normal.  Mouth/Throat: Oropharynx is clear and moist.  Eyes: Conjunctivae and EOM are normal. Pupils are equal, round, and reactive to light.  Neck: Normal range of motion. Neck supple. No JVD  present. No tracheal deviation present. No thyromegaly present.  Cardiovascular: Normal rate, regular rhythm, normal heart sounds and intact distal pulses.  Exam reveals no gallop and no friction rub.   No murmur heard. No carotid nor aortic bruits peripheral pulses 1+ and symmetrical  Pulmonary/Chest: Effort normal and breath sounds normal. No stridor. No respiratory distress. He has no wheezes. He has no rales. He exhibits no tenderness.  Abdominal: Soft. Bowel sounds are normal. He exhibits no distension and no mass. There is no tenderness. There is no rebound and no guarding.  Genitourinary: Rectum normal and penis normal. Guaiac negative stool. No penile tenderness.  1+ symmetrical BPH  Musculoskeletal: Normal range of motion. He exhibits no edema or tenderness.  Lymphadenopathy:    He has no cervical adenopathy.  Neurological: He is alert and oriented to person, place, and time. He has normal reflexes. No cranial nerve deficit. He exhibits normal muscle tone.  Skin: Skin is warm and dry. No rash noted. No erythema. No pallor.  Total body skin exam normal  Psychiatric: He has a normal mood and affect. His behavior is normal. Judgment and thought content normal.  Nursing note and vitals reviewed.         Assessment & Plan:  Hypertension ago continue current therapy  Hyperlipidemia...Marland KitchenMarland KitchenMarland Kitchen Continue current medication  Diabetes type 2 continue metformin exercise diet check labs  Chronic back pain continue Narco 7.5-325 3 times a day when necessary........again encouraged to get a pain consult he is amendable at this point  Depression continue Lexapro 20 mg  daily  A chronic anxiety and ADD..........Marland Kitchen Valium 5 mg 3 times a day when necessary  Chronic tobacco abuse,,,,,,, [place] consult with pulmonary to try to encourage him to quit smoking

## 2014-05-12 ENCOUNTER — Telehealth: Payer: Self-pay | Admitting: Family Medicine

## 2014-05-12 NOTE — Telephone Encounter (Signed)
emmi emailed °

## 2014-06-07 ENCOUNTER — Institutional Professional Consult (permissible substitution): Payer: Medicare Other | Admitting: Emergency Medicine

## 2014-06-07 ENCOUNTER — Ambulatory Visit (INDEPENDENT_AMBULATORY_CARE_PROVIDER_SITE_OTHER): Payer: Medicare Other | Admitting: Critical Care Medicine

## 2014-06-07 ENCOUNTER — Encounter: Payer: Self-pay | Admitting: Critical Care Medicine

## 2014-06-07 VITALS — BP 144/64 | HR 67 | Temp 97.0°F | Ht 74.0 in | Wt 245.0 lb

## 2014-06-07 DIAGNOSIS — E119 Type 2 diabetes mellitus without complications: Secondary | ICD-10-CM

## 2014-06-07 DIAGNOSIS — Z129 Encounter for screening for malignant neoplasm, site unspecified: Secondary | ICD-10-CM

## 2014-06-07 DIAGNOSIS — J449 Chronic obstructive pulmonary disease, unspecified: Secondary | ICD-10-CM

## 2014-06-07 DIAGNOSIS — F17210 Nicotine dependence, cigarettes, uncomplicated: Secondary | ICD-10-CM

## 2014-06-07 DIAGNOSIS — Z720 Tobacco use: Secondary | ICD-10-CM

## 2014-06-07 DIAGNOSIS — F17200 Nicotine dependence, unspecified, uncomplicated: Secondary | ICD-10-CM

## 2014-06-07 MED ORDER — ALBUTEROL SULFATE HFA 108 (90 BASE) MCG/ACT IN AERS
INHALATION_SPRAY | RESPIRATORY_TRACT | Status: DC
Start: 2014-06-07 — End: 2016-07-18

## 2014-06-07 NOTE — Progress Notes (Signed)
Subjective:    Patient ID: Drew Burke, male    DOB: 1946/02/15, 68 y.o.   MRN: [id number]  HPI 06/07/2014 Chief Complaint  Patient presents with  . 1 year follow up    Pt denies change in breathing since last OV. Pt c/o prod cough with green mucus- increase cough at night. Pt denies SOB and CP/tightness.    Annual f/u.  Still active smoking .  Still coughing daily mucus, will cough before go to bed, occ some in AM, green mucus.  No chest pain.  Still smoking < 1 PPD, e cigs, Had nightmares on chantix.   Pt denies any significant sore throat, nasal congestion or excess secretions, fever, chills, sweats, unintended weight loss, pleurtic or exertional chest pain, orthopnea PND, or leg swelling Pt denies any increase in rescue therapy over baseline, denies waking up needing it or having any early am or nocturnal exacerbations of coughing/wheezing/or dyspnea. Pt also denies any obvious fluctuation in symptoms with  weather or environmental change or other alleviating or aggravating factors    Review of Systems  Constitutional: Negative for diaphoresis, activity change, appetite change, fatigue and unexpected weight change.  HENT: Negative for congestion, dental problem, ear discharge, facial swelling, hearing loss, mouth sores, nosebleeds, sinus pressure, sneezing, tinnitus, trouble swallowing and voice change.   Eyes: Negative for photophobia, discharge, itching and visual disturbance.  Respiratory: Negative for apnea, choking, chest tightness and stridor.   Cardiovascular: Negative for palpitations and leg swelling.  Gastrointestinal: Negative for nausea, vomiting, abdominal pain, constipation, blood in stool and abdominal distention.  Genitourinary: Negative for dysuria, urgency, frequency, hematuria, flank pain, decreased urine volume and difficulty urinating.  Musculoskeletal: Negative for back pain, joint swelling, arthralgias, gait problem, neck pain and neck stiffness.  Skin:  Negative for color change and pallor.  Neurological: Negative for dizziness, tremors, seizures, syncope, speech difficulty, weakness, light-headedness and numbness.  Hematological: Negative for adenopathy. Does not bruise/bleed easily.  Psychiatric/Behavioral: Negative for confusion, sleep disturbance and agitation. The patient is nervous/anxious.        Objective:   Physical Exam Filed Vitals:   06/07/14 0959  BP: 144/64  Pulse: 67  Temp: 97 F (36.1 C)  TempSrc: Oral  Height: 6\' 2"  (1.88 m)  Weight: 245 lb (111.131 kg)  SpO2: 97%    Gen: Pleasant, well-nourished, in no distress,  normal a ENT: No lesions,  mouth clear,  oropharynx clear, no postnasal drip  Neck: No JVD, no TMG, no carotid bruits  Lungs: No use of accessory muscles, no dullness to percussion, distant BS,  Cardiovascular: RRR, heart sounds normal, no murmur or gallops, no peripheral edema  Abdomen: soft and NT, no HSM,  BS normal  Musculoskeletal: No deformities, no cyanosis or clubbing  Neuro: alert, non focal  Skin: Warm, no lesions or rashes  Spiro:  10/12>> FeV1 60%  04/2013>> FeV1 63% Fev1/FVC 58%  06/08/2014>>Spiro : FeV1 60% predicted FeV1/FVC 62%         Assessment & Plan:   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 With minimal change compared to previous pulmonary functions and 2014 Ongoing tobacco use Plan Low dose CT chest will be obtained to screen for lung cancer Stop smoking by using Nicotine lozenges 4mg  dose use 6-8 per day, [facility] Stay on albuterol as needed Return 1 year, sooner if needed    TOBACCO ABUSE We spent greater than 15 minutes giving this patient smoking cessation  counseling The patient will utilize nicotine lozenges 4 nicotine replacement  Cancer screening Patient with long-standing history of tobacco use Will enroll the patient in the low dose CT scan of chest lung cancer screening  program We discussed the pros and cons of entering this program together and they have shared decision that this is the best course of action and the patient agreed provided there is good insurance coverage    Updated Medication List Outpatient Encounter Prescriptions as of 06/07/2014  Medication Sig  . ACCU-CHEK SOFTCLIX LANCETS lancets Dx 250.00  . albuterol (PROAIR HFA) 108 (90 BASE) MCG/ACT inhaler INHALE 2 PUFFS INTO THE LUNGS EVERY 4 (FOUR) HOURS AS NEEDED FOR WHEEZING.  Marland Kitchen aspirin 81 MG tablet Take 81 mg by mouth daily.    . diazepam (VALIUM) 5 MG tablet 1 by mouth 3 times daily (Patient taking differently: 1 by mouth 3 times daily prn)  . escitalopram (LEXAPRO) 20 MG tablet TAKE 1 TABLET BY MOUTH EVERY DAY  . fluocinonide-emollient (LIDEX-E) 0.05 % cream Apply topically 2 (two) times daily.  Marland Kitchen glucose blood (ACCU-CHEK ACTIVE STRIPS) test strip Dx 250.00  . HYDROcodone-acetaminophen (NORCO) 7.5-325 MG per tablet Take 1 tablet by mouth every 8 (eight) hours as needed.  Marland Kitchen losartan-hydrochlorothiazide (HYZAAR) 100-25 MG per tablet TAKE 1 TABLET EVERY DAY  . metFORMIN (GLUCOPHAGE) 1000 MG tablet TAKE 1 TABLET BY MOUTH TWICE A DAY  . simvastatin (ZOCOR) 40 MG tablet TAKE 1 TABLET AT BEDTIME  . triamcinolone cream (KENALOG) 0.5 % Apply topically 2 (two) times daily. (Patient taking differently: Apply topically 2 (two) times daily as needed. )  . [DISCONTINUED] albuterol (PROVENTIL HFA;VENTOLIN HFA) 108 (90 BASE) MCG/ACT inhaler Inhale 2 puffs into the lungs every 4 (four) hours as needed for wheezing.  . [DISCONTINUED] PROAIR HFA 108 (90 BASE) MCG/ACT inhaler INHALE 2 PUFFS INTO THE LUNGS EVERY 4 (FOUR) HOURS AS NEEDED FOR WHEEZING.

## 2014-06-07 NOTE — Patient Instructions (Signed)
Low dose CT chest will be obtained to screen for lung cancer Stop smoking by using Nicotine lozenges 4mg  dose use 6-8 per day, [facility] Stay on albuterol as needed Return 1 year, sooner if needed

## 2014-06-08 DIAGNOSIS — Z129 Encounter for screening for malignant neoplasm, site unspecified: Secondary | ICD-10-CM | POA: Insufficient documentation

## 2014-06-08 NOTE — Assessment & Plan Note (Signed)
Patient with long-standing history of tobacco use Will enroll the patient in the low dose CT scan of chest lung cancer screening program We discussed the pros and cons of entering this program together and they have shared decision that this is the best course of action and the patient agreed provided there is good insurance coverage

## 2014-06-08 NOTE — Assessment & Plan Note (Signed)
Chronic obstructive bronchitis gold stage B. With minimal change compared to previous pulmonary functions and 2014 Ongoing tobacco use Plan Low dose CT chest will be obtained to screen for lung cancer Stop smoking by using Nicotine lozenges 4mg  dose use 6-8 per day, [facility] Stay on albuterol as needed Return 1 year, sooner if needed

## 2014-06-08 NOTE — Assessment & Plan Note (Signed)
We spent greater than 15 minutes giving this patient smoking cessation counseling The patient will utilize nicotine lozenges 4 nicotine replacement

## 2014-06-15 ENCOUNTER — Encounter: Payer: Self-pay | Admitting: Critical Care Medicine

## 2014-06-15 ENCOUNTER — Ambulatory Visit (INDEPENDENT_AMBULATORY_CARE_PROVIDER_SITE_OTHER)
Admission: RE | Admit: 2014-06-15 | Discharge: 2014-06-15 | Disposition: A | Discharge status: Home / Self Care | Payer: Medicare Other | Source: Ambulatory Visit | Attending: Critical Care Medicine | Admitting: Critical Care Medicine

## 2014-06-15 DIAGNOSIS — Z122 Encounter for screening for malignant neoplasm of respiratory organs: Secondary | ICD-10-CM

## 2014-06-15 DIAGNOSIS — F17210 Nicotine dependence, cigarettes, uncomplicated: Secondary | ICD-10-CM | POA: Diagnosis not present

## 2014-06-15 DIAGNOSIS — J449 Chronic obstructive pulmonary disease, unspecified: Secondary | ICD-10-CM

## 2014-06-15 DIAGNOSIS — I2584 Coronary atherosclerosis due to calcified coronary lesion: Secondary | ICD-10-CM

## 2014-06-15 DIAGNOSIS — J439 Emphysema, unspecified: Secondary | ICD-10-CM | POA: Insufficient documentation

## 2014-06-15 DIAGNOSIS — I2510 Atherosclerotic heart disease of native coronary artery without angina pectoris: Secondary | ICD-10-CM | POA: Insufficient documentation

## 2014-08-07 ENCOUNTER — Other Ambulatory Visit: Payer: Self-pay | Admitting: Family Medicine

## 2014-08-07 DIAGNOSIS — G8929 Other chronic pain: Secondary | ICD-10-CM

## 2014-08-07 DIAGNOSIS — M549 Dorsalgia, unspecified: Principal | ICD-10-CM

## 2014-08-07 MED ORDER — HYDROCODONE-ACETAMINOPHEN 7.5-325 MG PO TABS: 1.0000 | ORAL_TABLET | Freq: Three times a day (TID) | ORAL | Status: DC | PRN

## 2014-08-07 NOTE — Telephone Encounter (Signed)
rx up front for p/u, pt aware

## 2014-08-07 NOTE — Telephone Encounter (Signed)
Pt request refill of the following:  HYDROcodone-acetaminophen (NORCO) 7.5-325 MG per tablet   Phamacy: pick up

## 2014-09-22 ENCOUNTER — Other Ambulatory Visit: Payer: Self-pay | Admitting: Family Medicine

## 2014-10-06 ENCOUNTER — Telehealth: Payer: Self-pay | Admitting: Family Medicine

## 2014-10-06 DIAGNOSIS — M549 Dorsalgia, unspecified: Principal | ICD-10-CM

## 2014-10-06 DIAGNOSIS — G8929 Other chronic pain: Secondary | ICD-10-CM

## 2014-10-06 MED ORDER — HYDROCODONE-ACETAMINOPHEN 7.5-325 MG PO TABS: 1.0000 | ORAL_TABLET | Freq: Three times a day (TID) | ORAL | Status: DC | PRN

## 2014-10-06 NOTE — Telephone Encounter (Signed)
Rx ready for pick up and patient is aware 

## 2014-10-06 NOTE — Telephone Encounter (Signed)
Pt request refill of the following: HYDROcodone-acetaminophen (NORCO) 7.5-325 MG per tablet ° ° °Phamacy: ° °

## 2014-10-10 ENCOUNTER — Telehealth: Payer: Self-pay | Admitting: *Deleted

## 2014-10-10 DIAGNOSIS — G8929 Other chronic pain: Secondary | ICD-10-CM

## 2014-10-10 DIAGNOSIS — M549 Dorsalgia, unspecified: Principal | ICD-10-CM

## 2014-10-10 NOTE — Telephone Encounter (Signed)
Patient would like to transfer to [place].  Please schedule patient to be established. thanks

## 2014-10-10 NOTE — Telephone Encounter (Signed)
Pt is aware on Drew Burke

## 2014-10-10 NOTE — Telephone Encounter (Signed)
Patient requests a referral for pain management and request an appointment to establish with [facility]. Referral placed. Please call and schedule an appointment for patient.

## 2014-10-23 ENCOUNTER — Other Ambulatory Visit: Payer: Self-pay | Admitting: Family Medicine

## 2014-10-31 ENCOUNTER — Telehealth: Payer: Self-pay | Admitting: Family Medicine

## 2014-10-31 NOTE — Telephone Encounter (Signed)
Pt called to ask if you will call him he would like a recommendation about one of the 3 doctors.  [phone]

## 2014-11-02 NOTE — Telephone Encounter (Signed)
Spoke with patient.

## 2014-11-07 DIAGNOSIS — H2511 Age-related nuclear cataract, right eye: Secondary | ICD-10-CM | POA: Diagnosis not present

## 2014-11-07 DIAGNOSIS — H18411 Arcus senilis, right eye: Secondary | ICD-10-CM | POA: Diagnosis not present

## 2014-11-07 DIAGNOSIS — H2512 Age-related nuclear cataract, left eye: Secondary | ICD-10-CM | POA: Diagnosis not present

## 2014-11-07 DIAGNOSIS — E119 Type 2 diabetes mellitus without complications: Secondary | ICD-10-CM | POA: Diagnosis not present

## 2014-11-07 DIAGNOSIS — H3531 Nonexudative age-related macular degeneration: Secondary | ICD-10-CM | POA: Diagnosis not present

## 2014-11-07 LAB — HM DIABETES EYE EXAM

## 2014-11-09 ENCOUNTER — Encounter: Payer: Self-pay | Admitting: Family Medicine

## 2014-12-06 ENCOUNTER — Telehealth: Payer: Self-pay | Admitting: Family Medicine

## 2014-12-06 DIAGNOSIS — M549 Dorsalgia, unspecified: Principal | ICD-10-CM

## 2014-12-06 DIAGNOSIS — G8929 Other chronic pain: Secondary | ICD-10-CM

## 2014-12-06 NOTE — Telephone Encounter (Signed)
Pt request refill HYDROcodone-acetaminophen (NORCO) 7.5-325 MG per tablet  Pt has est appt w/ cory on June 28

## 2014-12-08 MED ORDER — HYDROCODONE-ACETAMINOPHEN 7.5-325 MG PO TABS: 1.0000 | ORAL_TABLET | Freq: Three times a day (TID) | ORAL | Status: DC | PRN

## 2014-12-08 NOTE — Telephone Encounter (Signed)
Rx ready for pick up and patient is aware 

## 2014-12-18 ENCOUNTER — Other Ambulatory Visit: Payer: Self-pay | Admitting: Family Medicine

## 2015-01-02 ENCOUNTER — Ambulatory Visit (INDEPENDENT_AMBULATORY_CARE_PROVIDER_SITE_OTHER): Payer: Medicare Other | Admitting: Adult Health

## 2015-01-02 ENCOUNTER — Encounter: Payer: Self-pay | Admitting: Adult Health

## 2015-01-02 VITALS — BP 136/80 | HR 74 | Temp 98.0°F | Ht 74.0 in | Wt 239.0 lb

## 2015-01-02 DIAGNOSIS — Z7689 Persons encountering health services in other specified circumstances: Secondary | ICD-10-CM

## 2015-01-02 DIAGNOSIS — Z720 Tobacco use: Secondary | ICD-10-CM | POA: Diagnosis not present

## 2015-01-02 DIAGNOSIS — Z7189 Other specified counseling: Secondary | ICD-10-CM | POA: Diagnosis not present

## 2015-01-02 DIAGNOSIS — M549 Dorsalgia, unspecified: Secondary | ICD-10-CM | POA: Diagnosis not present

## 2015-01-02 DIAGNOSIS — G8929 Other chronic pain: Secondary | ICD-10-CM

## 2015-01-02 DIAGNOSIS — F17200 Nicotine dependence, unspecified, uncomplicated: Secondary | ICD-10-CM

## 2015-01-02 DIAGNOSIS — E119 Type 2 diabetes mellitus without complications: Secondary | ICD-10-CM | POA: Diagnosis not present

## 2015-01-02 MED ORDER — HYDROCODONE-ACETAMINOPHEN 7.5-325 MG PO TABS: 1.0000 | ORAL_TABLET | Freq: Three times a day (TID) | ORAL | Status: DC | PRN

## 2015-01-02 NOTE — Progress Notes (Signed)
Pre visit review using our clinic review tool, if applicable. No additional management support is needed unless otherwise documented below in the visit note. 

## 2015-01-02 NOTE — Patient Instructions (Addendum)
It was great meeting you   Please start checking your blood sugars twice a day. Write them in your log and bring it with you in August.   Follow up in August for complete physical exam   Please let me know if you need anything in the mean time.   Diabetes Mellitus and Food It is important for you to manage your blood sugar (glucose) level. Your blood glucose level can be greatly affected by what you eat. Eating healthier foods in the appropriate amounts throughout the day at about the same time each day will help you control your blood glucose level. It can also help slow or prevent worsening of your diabetes mellitus. Healthy eating may even help you improve the level of your blood pressure and reach or maintain a healthy weight.  HOW CAN FOOD AFFECT ME? Carbohydrates Carbohydrates affect your blood glucose level more than any other type of food. Your dietitian will help you determine how many carbohydrates to eat at each meal and teach you how to count carbohydrates. Counting carbohydrates is important to keep your blood glucose at a healthy level, especially if you are using insulin or taking certain medicines for diabetes mellitus. Alcohol Alcohol can cause sudden decreases in blood glucose (hypoglycemia), especially if you use insulin or take certain medicines for diabetes mellitus. Hypoglycemia can be a life-threatening condition. Symptoms of hypoglycemia (sleepiness, dizziness, and disorientation) are similar to symptoms of having too much alcohol.  If your health care provider has given you approval to drink alcohol, do so in moderation and use the following guidelines:  Women should not have more than one drink per day, and men should not have more than two drinks per day. One drink is equal to:  12 oz of beer.  5 oz of wine.  1 oz of hard liquor.  Do not drink on an empty stomach.  Keep yourself hydrated. Have water, diet soda, or unsweetened iced tea.  Regular soda, juice,  and other mixers might contain a lot of carbohydrates and should be counted. WHAT FOODS ARE NOT RECOMMENDED? As you make food choices, it is important to remember that all foods are not the same. Some foods have fewer nutrients per serving than other foods, even though they might have the same number of calories or carbohydrates. It is difficult to get your body what it needs when you eat foods with fewer nutrients. Examples of foods that you should avoid that are high in calories and carbohydrates but low in nutrients include:  Trans fats (most processed foods list trans fats on the Nutrition Facts label).  Regular soda.  Juice.  Candy.  Sweets, such as cake, pie, doughnuts, and cookies.  Fried foods. WHAT FOODS CAN I EAT? Have nutrient-rich foods, which will nourish your body and keep you healthy. The food you should eat also will depend on several factors, including:  The calories you need.  The medicines you take.  Your weight.  Your blood glucose level.  Your blood pressure level.  Your cholesterol level. You also should eat a variety of foods, including:  Protein, such as meat, poultry, fish, tofu, nuts, and seeds (lean animal proteins are best).  Fruits.  Vegetables.  Dairy products, such as milk, cheese, and yogurt (low fat is best).  Breads, grains, pasta, cereal, rice, and beans.  Fats such as olive oil, trans fat-free margarine, canola oil, avocado, and olives. DOES EVERYONE WITH DIABETES MELLITUS HAVE THE SAME MEAL PLAN? Because every person with  diabetes mellitus is different, there is not one meal plan that works for everyone. It is very important that you meet with a dietitian who will help you create a meal plan that is just right for you. Document Released: 03/20/2005 Document Revised: 06/28/2013 Document Reviewed: 05/20/2013 [facility] Patient Information 2015 [place], [place]. This information is not intended to replace advice given to you by your health  care provider. Make sure you discuss any questions you have with your health care provider.

## 2015-01-02 NOTE — Progress Notes (Signed)
HPI:  Drew Burke is here to establish care.  Last PCP and physical: August 2015.    Has the following chronic problems that require follow up and concerns today:  Diabetes Type 2  - Would like to talk about diabetic diet and how to use his glucometer. He has not checked his blood sugar in over a year and does not always follow diabetic diet  Back Pain - has chronic lower back pain. Is trying to get established with pain clinic. He is currently taking three Nocro per day for his back pain  ROS negative for unless reported above: fevers, chills,feeling poorly, unintentional weight loss, hearing or vision loss, chest pain, palpitations, leg claudication, struggling to breath,Not feeling congested in the chest, no orthopenia, no cough,no wheezing, normal appetite, no soft tissue swelling, no hemoptysis, melena, hematochezia, hematuria, falls, loc, si, or thoughts of self harm.  Immunizations: UTD Diet: A lot vegetables and seafood. Does eat sweets Exercise: Does not exercise.  Colonoscopy:01/18/2003  YBO:[id number]  Dentist:[facility]   Past Medical History  Diagnosis Date  . Allergic rhinitis   . Hyperlipidemia   . Hypertension   . Tobacco abuse   . Anxiety   . Depression   . Aneurysm   . DM type 2 (diabetes mellitus, type 2)     Past Surgical History  Procedure Laterality Date  . Lumbar disc surgery    . Corneal transplant surgery left eye    . Lumbar laminectomy      Family History  Problem Relation Age of Onset  . Depression Other   . Hypertension Other   . Heart disease Father   . Cancer Mother     intestines    History   Social History  . Marital Status: Divorced    Spouse Name: N/A  . Number of Children: N/A  . Years of Education: N/A   Occupational History  . sales    Social History Main Topics  . Smoking status: Current Every Day Smoker -- 1.00 packs/day    Types: Cigarettes  . Smokeless tobacco: Never Used     Comment: used to smoke 3 ppd -  currently smoking 1 ppd - started smoking at age 72.  Marland Kitchen Alcohol Use: Yes  . Drug Use: No  . Sexual Activity: Not on file   Other Topics Concern  . None   Social History Narrative     Current outpatient prescriptions:  .  ACCU-CHEK SOFTCLIX LANCETS lancets, Dx 250.00, Disp: 100 each, Rfl: 3 .  albuterol (PROAIR HFA) 108 (90 BASE) MCG/ACT inhaler, INHALE 2 PUFFS INTO THE LUNGS EVERY 4 (FOUR) HOURS AS NEEDED FOR WHEEZING., Disp: 18 g, Rfl: 6 .  aspirin 81 MG tablet, Take 81 mg by mouth daily.  , Disp: , Rfl:  .  diazepam (VALIUM) 5 MG tablet, TAKE 1 TABLET BY MOUTH 3 TIMES A DAY, Disp: 100 tablet, Rfl: 0 .  fluocinonide-emollient (LIDEX-E) 0.05 % cream, Apply topically 2 (two) times daily., Disp: 60 g, Rfl: 2 .  glucose blood (ACCU-CHEK ACTIVE STRIPS) test strip, Dx 250.00, Disp: 100 each, Rfl: 3 .  HYDROcodone-acetaminophen (NORCO) 7.5-325 MG per tablet, Take 1 tablet by mouth every 8 (eight) hours as needed., Disp: 90 tablet, Rfl: 0 .  losartan-hydrochlorothiazide (HYZAAR) 100-25 MG per tablet, TAKE 1 TABLET EVERY DAY, Disp: 100 tablet, Rfl: 3 .  metFORMIN (GLUCOPHAGE) 1000 MG tablet, TAKE 1 TABLET BY MOUTH TWICE A DAY, Disp: 200 tablet, Rfl: 3 .  simvastatin (ZOCOR) 40  MG tablet, TAKE 1 TABLET AT BEDTIME, Disp: 100 tablet, Rfl: 3 .  triamcinolone cream (KENALOG) 0.5 %, APPLY TOPICALLY TWICE A DAY, Disp: 30 g, Rfl: 0 .  escitalopram (LEXAPRO) 20 MG tablet, TAKE 1 TABLET BY MOUTH EVERY DAY (Patient not taking: Reported on 01/02/2015), Disp: 90 tablet, Rfl: 3 .  escitalopram (LEXAPRO) 20 MG tablet, TAKE 1 TABLET BY MOUTH EVERY DAY (Patient not taking: Reported on 01/02/2015), Disp: 90 tablet, Rfl: 1  EXAM:  Filed Vitals:   01/02/15 0835  BP: 136/80  Pulse: 74  Temp: 98 F (36.7 C)    Body mass index is 30.67 kg/(m^2).  GENERAL: vitals reviewed and listed above, alert, oriented, appears well hydrated and in no acute distress. Obese, especially in abdomen.   HEENT: atraumatic,  conjunttiva clear, no obvious abnormalities on inspection of external nose and ears  NECK: Neck is soft and supple without masses, no adenopathy or thyromegaly, trachea midline, no JVD. Normal range of motion.   LUNGS: clear to auscultation bilaterally, no wheezes, rales or rhonchi, good air movement  CV: Regular rate and rhythm, normal S1/S2, no audible murmurs, gallops, or rubs. No carotid bruit and no peripheral edema.   MS: moves all extremities without noticeable abnormality. No edema noted  Abd: soft/nontender/nondistended/normal bowel sounds. Obese in abdomen.    Skin: warm and dry, no rash   Extremities: No clubbing, cyanosis, or edema. Capillary refill is WNL. Pulses intact bilaterally in upper and lower extremities.   Neuro: CN II-XII intact, sensation and reflexes normal throughout, 5/5 muscle strength in bilateral upper and lower extremities. Normal finger to nose. Normal rapid alternating movements.   PSYCH: pleasant and cooperative, no obvious depression or anxiety  ASSESSMENT AND PLAN:  1. Encounter to establish care - Ambulatory referral to Gastroenterology - for colonoscipy - Follow up in August for CPE - Follow up sooner if needed  2. Back pain, chronic - He needs to call panic clinic representative back so that he can establish care.  - HYDROcodone-acetaminophen (NORCO) 7.5-325 MG per tablet; Take 1 tablet by mouth every 8 (eight) hours as needed.  Dispense: 90 tablet; Refill: 0. One month only  3. TOBACCO ABUSE He does not want to quit smoking, has tried chantix, gums, patches in the past which has not worked.   4. Diabetes type 2, controlled - Educated on use of glucometer. He was given information on diabetic diet.  - Amb Referral to Nutrition and Diabetic E - He needs to start exercising again.    Discussed the following assessment and plan:  No diagnosis found. -We reviewed the PMH, PSH, FH, SH, Meds and Allergies. -We provided refills for any  medications we will prescribe as needed. -We addressed current concerns per orders and patient instructions. -We have asked for records for pertinent exams, studies, vaccines and notes from previous providers. -We have advised patient to follow up per instructions below.   -Patient advised to return or notify a provider immediately if symptoms worsen or persist or new concerns arise.  There are no Patient Instructions on file for this visit.   [employer]

## 2015-01-03 ENCOUNTER — Encounter: Payer: Self-pay | Admitting: Physical Medicine & Rehabilitation

## 2015-01-04 MED ORDER — ONETOUCH DELICA LANCETS FINE MISC: Status: AC

## 2015-01-04 MED ORDER — GLUCOSE BLOOD VI STRP: ORAL_STRIP | Status: DC

## 2015-01-04 NOTE — Addendum Note (Signed)
Addended by: Colleen Can on: 01/04/2015 10:58 AM   Modules accepted: Orders

## 2015-01-25 ENCOUNTER — Encounter: Payer: Self-pay | Admitting: Physical Medicine & Rehabilitation

## 2015-01-29 ENCOUNTER — Other Ambulatory Visit: Payer: Self-pay | Admitting: Family Medicine

## 2015-02-10 IMAGING — CT CT CHEST LUNG CANCER SCREENING LOW DOSE W/O CM
2 of 6 series · 15 of 40 positions shown, 18 images · IV contrast (Omnipaque 300)
Comparison: No priors.

CLINICAL DATA: 50-year-old male current smoker with 52 pack year
history of smoking. Lung cancer screening examination.

EXAM:
CT CHEST WITHOUT CONTRAST
TECHNIQUE: Multidetector CT imaging of the chest was performed following the
standard protocol without intravenous contrast. High resolution
imaging of the lungs, as well as inspiratory and expiratory imaging,
was performed.

[Series 5: lungs thins for pacs · axial · 0.83mm/px · z∈[-331,-29]mm · 12 of 422 slices shown, 15 images]
[im 22/422  mediastinal]
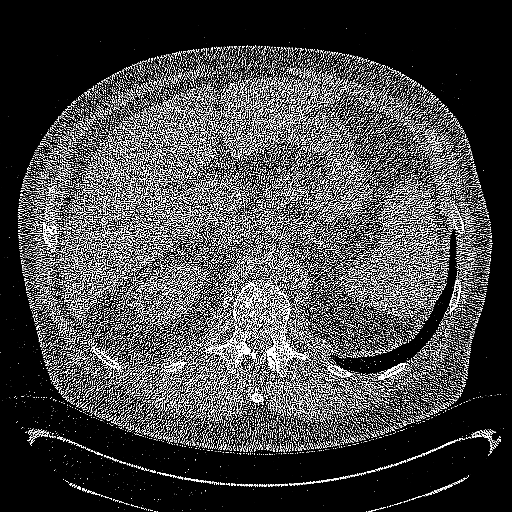
[im 22/422  lung]
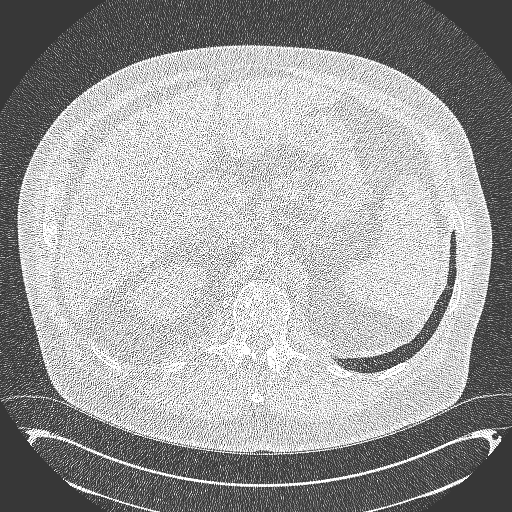
[im 64/422  lung]
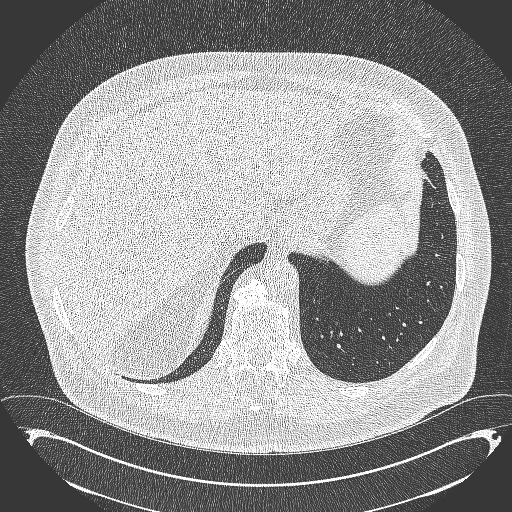
[im 85/422  lung]
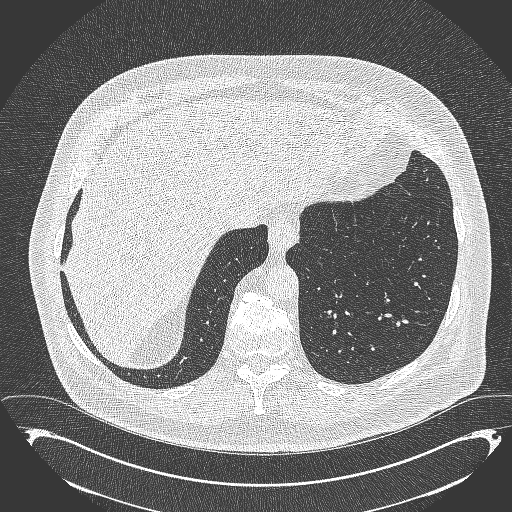
[im 127/422  lung]
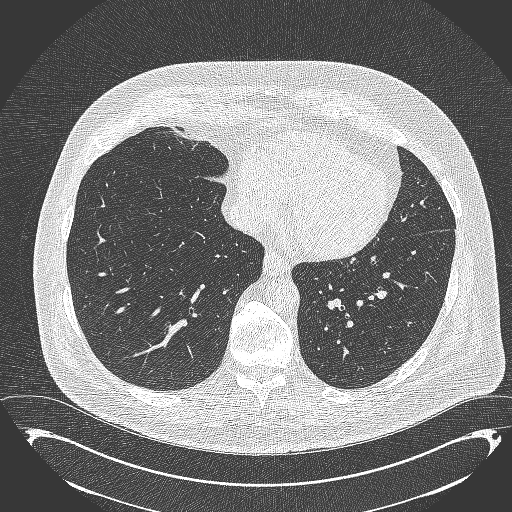
[im 169/422  mediastinal]
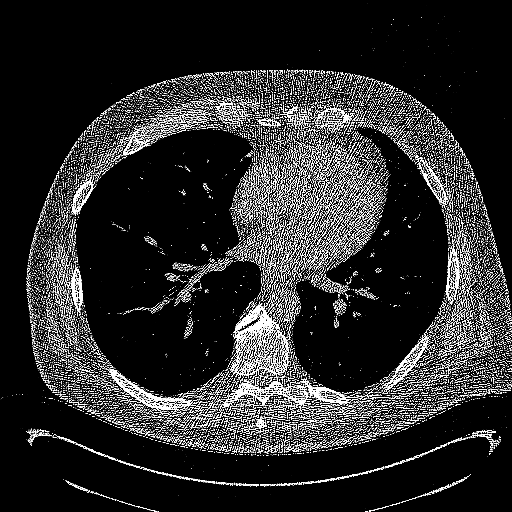
[im 169/422  lung]
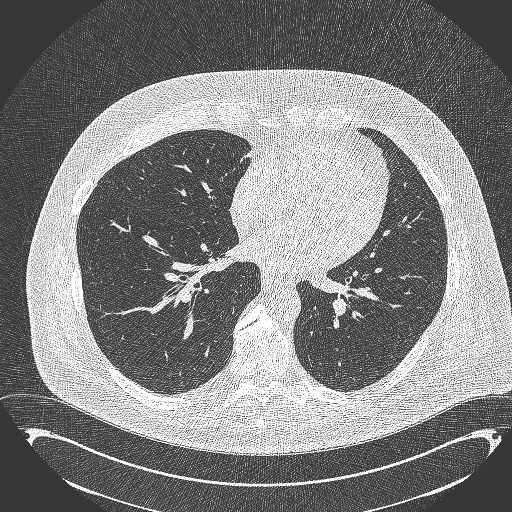
[im 190/422  lung]
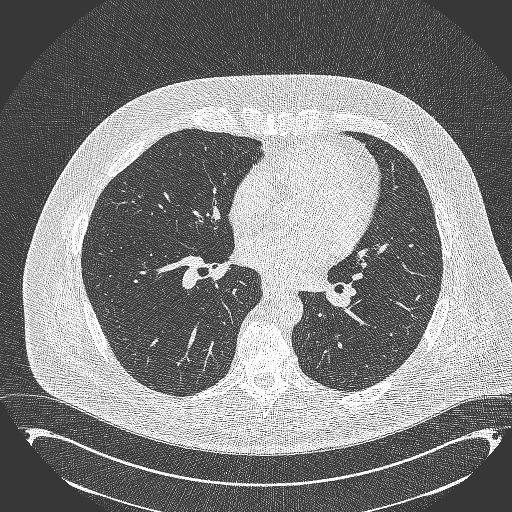
[im 232/422  lung]
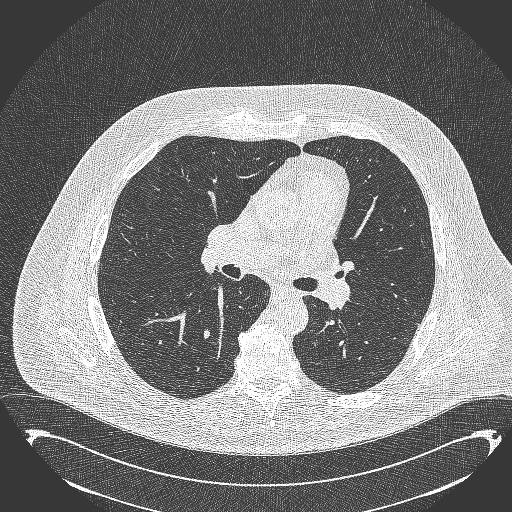
[im 253/422  lung]
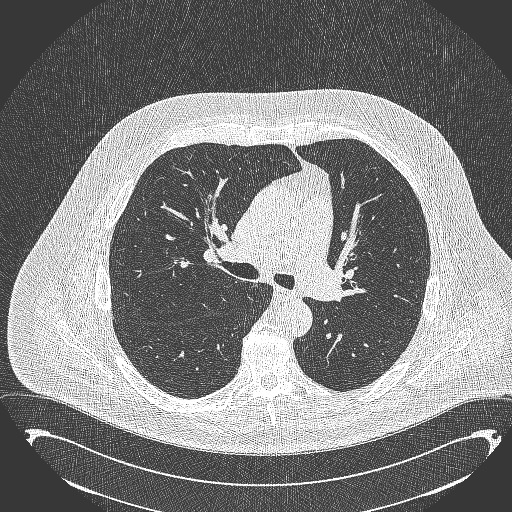
[im 295/422  mediastinal]
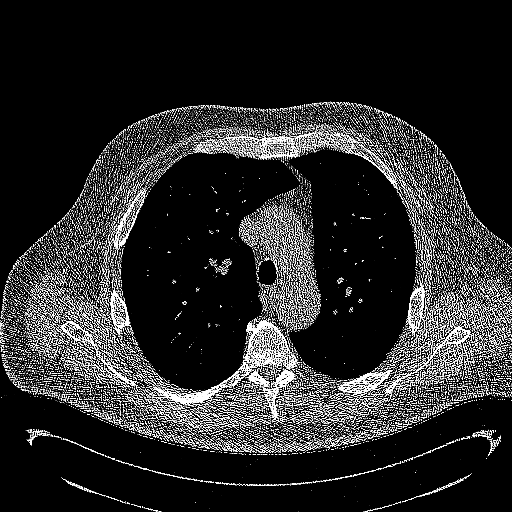
[im 295/422  lung]
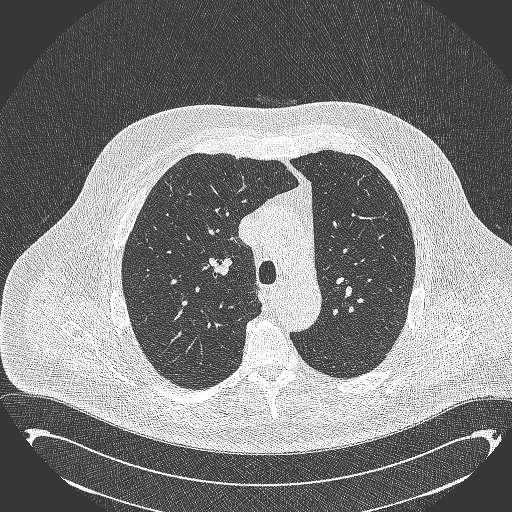
[im 337/422  lung]
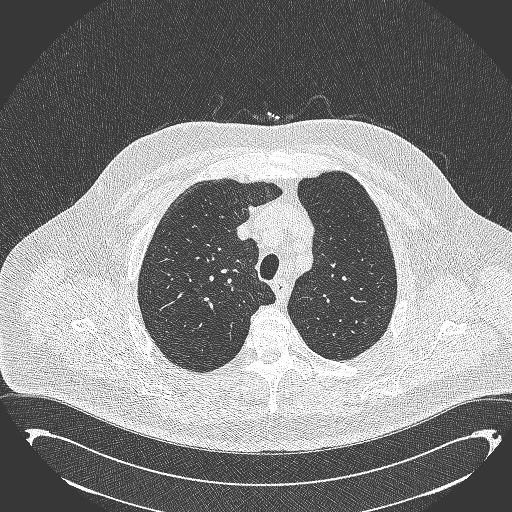
[im 358/422  lung]
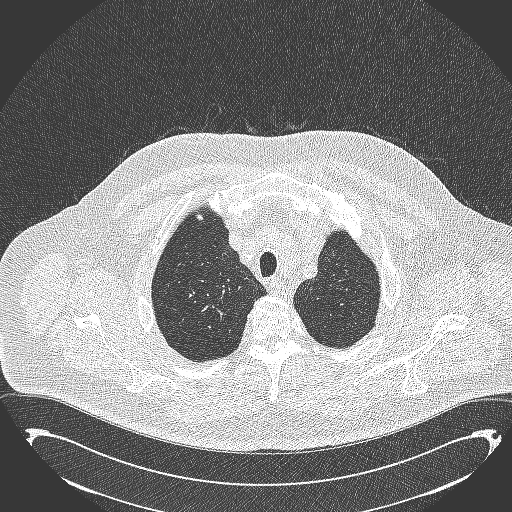
[im 400/422  lung]
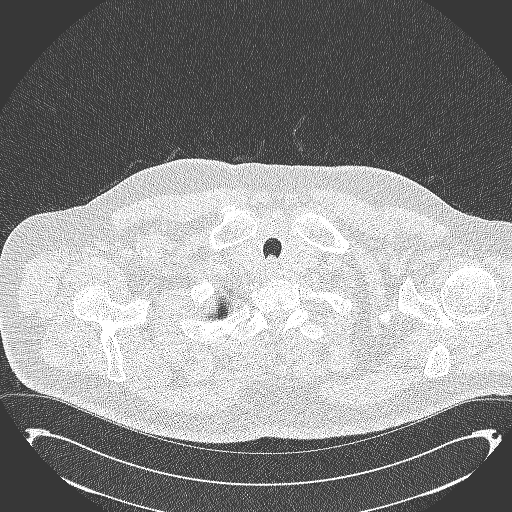

[Series 602: cor · coronal · 0.83mm/px · 3 of 132 slices shown]
[im 27/132  lung]
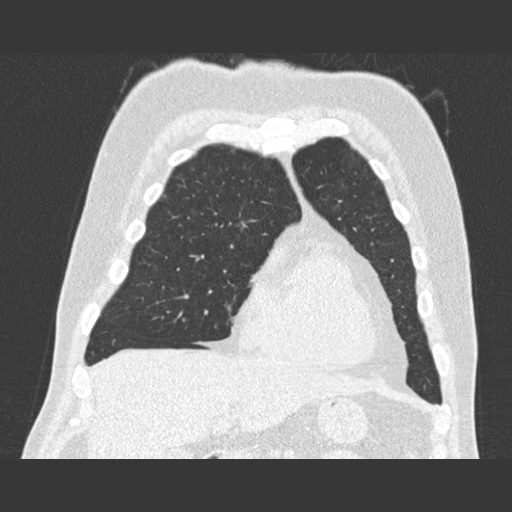
[im 53/132  lung]
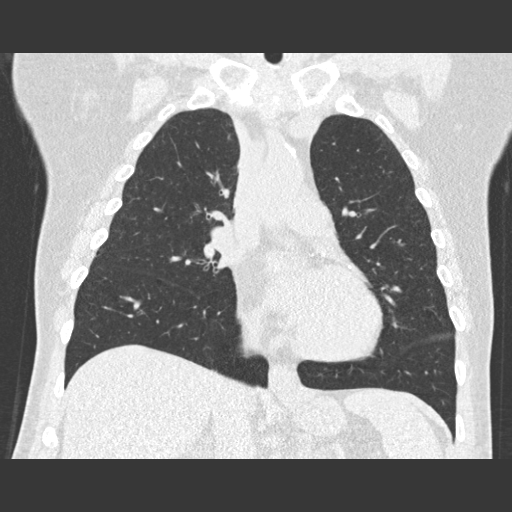
[im 79/132  lung]
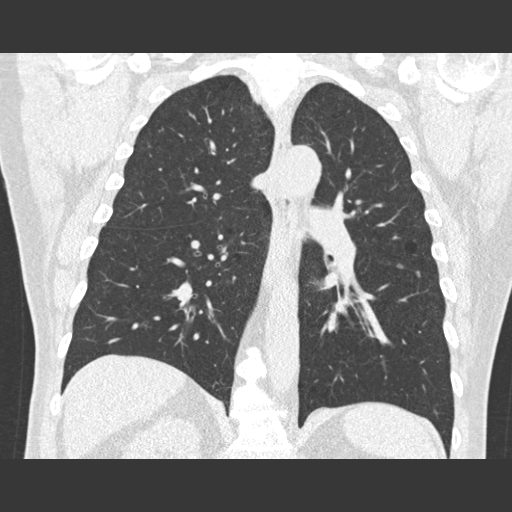

[15 of 40 positions shown; findings below may reference images not displayed]

FINDINGS: Mediastinum: Heart size is normal. There is no significant
pericardial fluid, thickening or pericardial calcification. There is
atherosclerosis of the thoracic aorta, the great vessels of the
mediastinum and the coronary arteries, including calcified
atherosclerotic plaque in the left main, left anterior descending,
left circumflex and right coronary arteries. Calcifications of the
aortic valve. No pathologically enlarged mediastinal or hilar lymph
nodes. Please note that accurate exclusion of hilar adenopathy is
limited on noncontrast CT scans. Esophagus is unremarkable in
appearance. Thyroid gland appears mildly enlarged and heterogeneous
in appearance with multiple coarse calcifications, suggesting
underlying goiter.

Lungs/Pleura: 7 x 4 mm (mean diameter of 5.5 mm) subpleural nodule
in the anterior aspect of the left lower lobe (image 231 of series
5) abutting the left major fissure. 5 x 4 mm fissural nodule
associated with the left major fissure (image 223 of series [DATE] x
4 mm in subpleural nodule in the posterior aspect of the right upper
lobe abutting the major fissure (image 123 of series 5). Multiple
other smaller pulmonary nodules are also noted. No acute
consolidative airspace disease. No pleural effusions. Mild diffuse
bronchial wall thickening with very mild centrilobular and
paraseptal emphysema.

Upper Abdomen: Several sub cm low-attenuation lesions scattered
throughout the visualized liver, incompletely characterized on
today's noncontrast CT examination (statistically likely tiny
cysts).

Musculoskeletal: There are no aggressive appearing lytic or blastic
lesions noted in the visualized portions of the skeleton.
IMPRESSION: 1. Lung-RADS Category 2, benign appearance or behavior. Continue
annual screening with low-dose chest CT without contrast in 12
months.
2. Mild diffuse bronchial wall thickening with mild centrilobular
and paraseptal emphysema; imaging findings suggestive of underlying
COPD.
3. Atherosclerosis, including left main and 3 vessel coronary artery
disease. Please note that although the presence of coronary artery
calcium documents the presence of coronary artery disease, the
severity of this disease and any potential stenosis cannot be
assessed on this non-gated CT examination. Assessment for potential
risk factor modification, dietary therapy or pharmacologic therapy
may be warranted, if clinically indicated.
4. Additional incidental findings, as above.

## 2015-02-16 ENCOUNTER — Ambulatory Visit: Payer: Medicare Other

## 2015-02-16 ENCOUNTER — Ambulatory Visit: Payer: Medicare Other | Admitting: Physical Medicine & Rehabilitation

## 2015-02-23 ENCOUNTER — Ambulatory Visit (HOSPITAL_BASED_OUTPATIENT_CLINIC_OR_DEPARTMENT_OTHER): Payer: Medicare Other | Admitting: Physical Medicine & Rehabilitation

## 2015-02-23 ENCOUNTER — Encounter: Payer: Self-pay | Admitting: Physical Medicine & Rehabilitation

## 2015-02-23 ENCOUNTER — Other Ambulatory Visit: Payer: Self-pay | Admitting: Physical Medicine & Rehabilitation

## 2015-02-23 ENCOUNTER — Encounter: Payer: Medicare Other | Attending: Physical Medicine & Rehabilitation

## 2015-02-23 VITALS — BP 131/73 | HR 70

## 2015-02-23 DIAGNOSIS — Z5181 Encounter for therapeutic drug level monitoring: Secondary | ICD-10-CM | POA: Diagnosis not present

## 2015-02-23 DIAGNOSIS — G8929 Other chronic pain: Secondary | ICD-10-CM

## 2015-02-23 DIAGNOSIS — R209 Unspecified disturbances of skin sensation: Secondary | ICD-10-CM | POA: Diagnosis not present

## 2015-02-23 DIAGNOSIS — E119 Type 2 diabetes mellitus without complications: Secondary | ICD-10-CM | POA: Insufficient documentation

## 2015-02-23 DIAGNOSIS — Z79899 Other long term (current) drug therapy: Secondary | ICD-10-CM | POA: Diagnosis not present

## 2015-02-23 DIAGNOSIS — F418 Other specified anxiety disorders: Secondary | ICD-10-CM | POA: Insufficient documentation

## 2015-02-23 DIAGNOSIS — F17200 Nicotine dependence, unspecified, uncomplicated: Secondary | ICD-10-CM | POA: Diagnosis not present

## 2015-02-23 DIAGNOSIS — M961 Postlaminectomy syndrome, not elsewhere classified: Secondary | ICD-10-CM

## 2015-02-23 DIAGNOSIS — R200 Anesthesia of skin: Secondary | ICD-10-CM

## 2015-02-23 DIAGNOSIS — R208 Other disturbances of skin sensation: Secondary | ICD-10-CM

## 2015-02-23 DIAGNOSIS — E785 Hyperlipidemia, unspecified: Secondary | ICD-10-CM | POA: Diagnosis not present

## 2015-02-23 DIAGNOSIS — I10 Essential (primary) hypertension: Secondary | ICD-10-CM | POA: Diagnosis not present

## 2015-02-23 DIAGNOSIS — M545 Low back pain, unspecified: Secondary | ICD-10-CM

## 2015-02-23 DIAGNOSIS — M25561 Pain in right knee: Secondary | ICD-10-CM

## 2015-02-23 NOTE — Progress Notes (Signed)
Subjective:    Patient ID: Drew Burke, male    DOB: 11/24/1945, 69 y.o.   MRN: [id number]  HPI CC:  Low back pain  Patient has had a long history of back pain however it started worsening around 4 years ago. He had to quit playing golf 4 years ago because of his back. Very occ leg pain, Has numbness in both feet with a history of diabetes. No recent falls or injuries. No weight loss no night sweats no nocturnal pain. Hx of lumbar surgery, at age 51, also ~95yr ago Had PT post op Typically pain is worst first thing in am, Subsides later in the day. Was on hydrocodone for years , He states that it used to be very effective for him but then became less effective over time.  Tylenol for pain, in past tried tramadol No trial of injection Tried chiropractic- occ helpful  Drew Burke is retired admits to having 1-2 drinks per day, He states that if he takes his hydrocodone he will not have a drink that day however he reported taking hydrocodone every day. Patient has occasional right knee pain but does not feel that this is related to his back. He does not have any swelling in the right knee.  Pain Inventory Average Pain 5 Pain Right Now 6 My pain is dull and aching  In the last 24 hours, has pain interfered with the following? General activity 2 Relation with others 5 Enjoyment of life 4 What TIME of day is your pain at its worst? morning Sleep (in general) Fair  Pain is worse with: bending Pain improves with: rest and medication Relief from Meds: 9  Mobility walk without assistance ability to climb steps?  yes do you drive?  yes  Function retired I need assistance with the following:  household duties  Neuro/Psych bladder control problems bowel control problems weakness confusion anxiety  Prior Studies Any changes since last visit?  no  Physicians involved in your care Any changes since last visit?  no Primary care Nafziger, Cory   Family History  Problem  Relation Age of Onset  . Depression Other   . Hypertension Other   . Heart disease Father   . Alcohol abuse Father   . Cancer Mother     intestines   Social History   Social History  . Marital Status: Divorced    Spouse Name: N/A  . Number of Children: N/A  . Years of Education: N/A   Occupational History  . sales    Social History Main Topics  . Smoking status: Current Every Day Smoker -- 1.00 packs/day    Types: Cigarettes  . Smokeless tobacco: Never Used     Comment: used to smoke 3 ppd - currently smoking 1 ppd - started smoking at age 46.  Marland Kitchen Alcohol Use: Yes  . Drug Use: No  . Sexual Activity: Not Asked   Other Topics Concern  . None   Social History Narrative   Retired - Diplomatic Services operational officer by himself   Has a dog      Past Surgical History  Procedure Laterality Date  . Lumbar disc surgery    . Corneal transplant surgery left eye    . Lumbar laminectomy     Past Medical History  Diagnosis Date  . Allergic rhinitis   . Hyperlipidemia   . Hypertension   . Tobacco abuse   . Anxiety   . Depression   . Aneurysm   .  DM type 2 (diabetes mellitus, type 2)    BP 131/73 mmHg  Pulse 70  SpO2 97%  Opioid Risk Score:   Fall Risk Score:  `1  Depression screen PHQ 2/9  Depression screen [facility] 2/9 02/23/2015 01/02/2015 09/06/2013  Decreased Interest 2 1 0  Down, Depressed, Hopeless 2 0 0  PHQ - 2 Score 4 1 0  Altered sleeping 1 - -  Tired, decreased energy 2 - -  Change in appetite 1 - -  Feeling bad or failure about yourself  2 - -  Trouble concentrating 1 - -  Moving slowly or fidgety/restless 0 - -  Suicidal thoughts 0 - -  PHQ-9 Score 11 - -     Review of Systems  Respiratory: Positive for cough and wheezing.   Gastrointestinal: Positive for diarrhea and constipation.  Endocrine:       High blood sugar  Genitourinary: Positive for difficulty urinating.  Neurological: Positive for weakness.  Hematological: Bruises/bleeds easily.    Psychiatric/Behavioral: Positive for confusion and dysphoric mood. The patient is nervous/anxious.   All other systems reviewed and are negative.      Objective:   Physical Exam  Constitutional: He is oriented to person, place, and time. He appears well-developed and well-nourished.  HENT:  Head: Normocephalic and atraumatic.  Eyes: Conjunctivae and EOM are normal. Pupils are equal, round, and reactive to light.  Neck: Normal range of motion.  Cardiovascular: Normal rate, regular rhythm, normal heart sounds and intact distal pulses.   No murmur heard. Pulmonary/Chest: Effort normal. No respiratory distress. He has wheezes. He has no rales.  Abdominal: Soft. Bowel sounds are normal. He exhibits no distension. There is no tenderness.  Musculoskeletal:       Right knee: He exhibits normal range of motion, no swelling, no effusion, no deformity, normal alignment, no LCL laxity and no MCL laxity. No tenderness found.       Left knee: He exhibits normal range of motion, no swelling, no effusion and no deformity. No tenderness found.       Cervical back: He exhibits normal range of motion.       Thoracic back: He exhibits decreased range of motion. He exhibits no tenderness.       Lumbar back: He exhibits decreased range of motion. He exhibits no tenderness, no bony tenderness and no deformity.  Neurological: He is alert and oriented to person, place, and time. He has normal strength. A sensory deficit is present. He exhibits normal muscle tone. Coordination and gait normal.  Reflex Scores:      Tricep reflexes are 2+ on the right side and 2+ on the left side.      Bicep reflexes are 2+ on the right side and 2+ on the left side.      Brachioradialis reflexes are 2+ on the right side and 2+ on the left side.      Patellar reflexes are 2+ on the right side and 2+ on the left side.      Achilles reflexes are 0 on the right side and 0 on the left side. Decreased sensation in the feet to pinprick  bilaterally.  Motor strength is 5/5 bilateral deltoids, biceps, triceps, grip, hip flexor, knee extensor, ankle dorsi flexors and plantar flexor  Skin: Skin is warm and dry.  Psychiatric: He has a normal mood and affect. His behavior is normal. Judgment and thought content normal.  Nursing note and vitals reviewed.  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 As discussed with the patient, his pain may have been more discogenic years ago but is likely developing more of a lumbar spondylosis picture. No evidence of radiculopathy.We'll check x-rays of the lumbar spine,No current need for MRI unless the patient develops more radicular signs. Schedule for medial branch blocks  We discussed multimodal approach including physical therapy injections and medications.We discussed the danger of using alcohol with narcotic analgesics. Will need to check urine drug screen.  He has some moderate depression which she attributes to not having much to do and living by himself. He is already on SSRI, could consider switch to S NRI which may have some beneficial effect for his low back pain as well  He's had some tolerance built up on his hydrocodone he's been off of it for a couple weeks. He has not had any improvement with tramadol. Would start with a schedule 3 medication such as Tylenol with codeine or buprenorphine patch should his pain not be managed adequately on over-the-counter medications in combination with injections and therapy.  2. Bilateral lower extremity numbness, this is most likely due to his diabetes rather than a spine related issue. At this point it is not bothering him a lot. Would consider EMG/MCV to further evaluate  Should this become more of an issue. This does appear to be causing problems with balance. At this point patient is reluctant to start physical therapy for balance program. We'll revisit this once he gets back into his usual  walking routine

## 2015-02-23 NOTE — Patient Instructions (Signed)
Approach in this clinic as accommodation between therapy, exercise, medication and injections.  We will do spinal injections next visit we'll order x-rays to review

## 2015-02-24 LAB — PMP ALCOHOL METABOLITE (ETG)

## 2015-02-26 ENCOUNTER — Telehealth: Payer: Self-pay | Admitting: Physical Medicine & Rehabilitation

## 2015-02-27 ENCOUNTER — Encounter: Payer: Medicare Other | Attending: Adult Health | Admitting: *Deleted

## 2015-02-27 ENCOUNTER — Encounter: Payer: Self-pay | Admitting: *Deleted

## 2015-02-27 VITALS — Ht 74.0 in | Wt 240.1 lb

## 2015-02-27 DIAGNOSIS — E119 Type 2 diabetes mellitus without complications: Secondary | ICD-10-CM

## 2015-02-27 DIAGNOSIS — Z713 Dietary counseling and surveillance: Secondary | ICD-10-CM | POA: Diagnosis not present

## 2015-02-27 NOTE — Patient Instructions (Signed)
Plan:  Aim for 3 Carb Choices per meal (45 grams) +/- 1 either way  Aim for 0-15 Carbs per snack if hungry  Include protein in moderation with your meals and snacks Consider reading food labels for Total Carbohydrate and Fat Grams of foods Consider  increasing your activity level by walking for 30 minutes daily as tolerated. This is a goal not an immediate expectation. Consider checking BG at alternate times per day to include fasting and/or as directed by MD  continue taking medication as directed by MD  Consider [employer] for a butter substitute Consider other options while watching ball game - raw veggies,
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
## 2015-03-02 ENCOUNTER — Encounter: Payer: Self-pay | Admitting: *Deleted

## 2015-03-02 LAB — ETHYL GLUCURONIDE, URINE
ETGU: 1691 ng/mL — AB (ref <500)
Ethyl Sulfate (ETS): 464 ng/mL — ABNORMAL HIGH (ref <100)

## 2015-03-02 LAB — BENZODIAZEPINES (GC/LC/MS), URINE
ALPRAZOLAMU: NEGATIVE ng/mL (ref <25)
Clonazepam metabolite (GC/LC/MS), ur confirm: NEGATIVE ng/mL (ref <25)
Flurazepam metabolite (GC/LC/MS), ur confirm: NEGATIVE ng/mL (ref <50)
Lorazepam (GC/LC/MS), ur confirm: NEGATIVE ng/mL (ref <50)
Midazolam (GC/LC/MS), ur confirm: NEGATIVE ng/mL (ref <50)
Nordiazepam (GC/LC/MS), ur confirm: 126 ng/mL — AB (ref <50)
OXAZEPAMU: 901 ng/mL — AB (ref <50)
TEMAZEPAMU: 629 ng/mL — AB (ref <50)
Triazolam metabolite (GC/LC/MS), ur confirm: NEGATIVE ng/mL (ref <50)

## 2015-03-02 LAB — PRESCRIPTION MONITORING PROFILE (SOLSTAS)
Amphetamine/Meth: NEGATIVE ng/mL
Barbiturate Screen, Urine: NEGATIVE ng/mL
Buprenorphine, Urine: NEGATIVE ng/mL
Cannabinoid Scrn, Ur: NEGATIVE ng/mL
Carisoprodol, Urine: NEGATIVE ng/mL
Cocaine Metabolites: NEGATIVE ng/mL
Creatinine, Urine: 76.19 mg/dL (ref >20.0)
Fentanyl, Ur: NEGATIVE ng/mL
MDMA URINE: NEGATIVE ng/mL
Meperidine, Ur: NEGATIVE ng/mL
Methadone Screen, Urine: NEGATIVE ng/mL
Nitrites, Initial: NEGATIVE ug/mL
Opiate Screen, Urine: NEGATIVE ng/mL
Oxycodone Screen, Ur: NEGATIVE ng/mL
Propoxyphene: NEGATIVE ng/mL
Tapentadol, urine: NEGATIVE ng/mL
Tramadol Scrn, Ur: NEGATIVE ng/mL
Zolpidem, Urine: NEGATIVE ng/mL
pH, Initial: 5.3 pH (ref 4.5–8.9)

## 2015-03-02 NOTE — Telephone Encounter (Signed)
Patient is wanting Dr. Letta Pate to prescribe his hydrocodone--current doctor does not want to continue writing it for him.  Please call patient to advise of what to do.  I did explain to him that we do have to wait for his UDS to come back.

## 2015-03-05 ENCOUNTER — Encounter: Payer: Self-pay | Admitting: Adult Health

## 2015-03-05 ENCOUNTER — Ambulatory Visit (INDEPENDENT_AMBULATORY_CARE_PROVIDER_SITE_OTHER): Payer: Medicare Other | Admitting: Adult Health

## 2015-03-05 VITALS — BP 100/68 | Temp 98.7°F | Ht 74.0 in | Wt 242.4 lb

## 2015-03-05 DIAGNOSIS — G8929 Other chronic pain: Secondary | ICD-10-CM

## 2015-03-05 DIAGNOSIS — M549 Dorsalgia, unspecified: Secondary | ICD-10-CM

## 2015-03-05 DIAGNOSIS — Z87891 Personal history of nicotine dependence: Secondary | ICD-10-CM

## 2015-03-05 DIAGNOSIS — I10 Essential (primary) hypertension: Secondary | ICD-10-CM | POA: Diagnosis not present

## 2015-03-05 DIAGNOSIS — Z125 Encounter for screening for malignant neoplasm of prostate: Secondary | ICD-10-CM | POA: Diagnosis not present

## 2015-03-05 DIAGNOSIS — Z0000 Encounter for general adult medical examination without abnormal findings: Secondary | ICD-10-CM | POA: Diagnosis not present

## 2015-03-05 DIAGNOSIS — Z720 Tobacco use: Secondary | ICD-10-CM

## 2015-03-05 DIAGNOSIS — H9193 Unspecified hearing loss, bilateral: Secondary | ICD-10-CM

## 2015-03-05 DIAGNOSIS — E119 Type 2 diabetes mellitus without complications: Secondary | ICD-10-CM

## 2015-03-05 LAB — POCT URINALYSIS DIPSTICK
BILIRUBIN UA: NEGATIVE
Glucose, UA: NEGATIVE
KETONES UA: NEGATIVE
LEUKOCYTES UA: NEGATIVE
Nitrite, UA: NEGATIVE
Protein, UA: NEGATIVE
Spec Grav, UA: 1.01
Urobilinogen, UA: 0.2
pH, UA: 5.5

## 2015-03-05 LAB — HEPATIC FUNCTION PANEL
ALK PHOS: 49 U/L (ref 39–117)
ALT: 18 U/L (ref 0–53)
AST: 16 U/L (ref 0–37)
Albumin: 4.6 g/dL (ref 3.5–5.2)
Bilirubin, Direct: 0.2 mg/dL (ref 0.0–0.3)
Total Bilirubin: 0.9 mg/dL (ref 0.2–1.2)
Total Protein: 6.7 g/dL (ref 6.0–8.3)

## 2015-03-05 LAB — CBC WITH DIFFERENTIAL/PLATELET
BASOS ABS: 0 10*3/uL (ref 0.0–0.1)
Basophils Relative: 0.3 % (ref 0.0–3.0)
EOS ABS: 0.1 10*3/uL (ref 0.0–0.7)
Eosinophils Relative: 0.8 % (ref 0.0–5.0)
HCT: 47.9 % (ref 39.0–52.0)
HEMOGLOBIN: 16.2 g/dL (ref 13.0–17.0)
Lymphocytes Relative: 53.7 % — ABNORMAL HIGH (ref 12.0–46.0)
Lymphs Abs: 4.5 10*3/uL — ABNORMAL HIGH (ref 0.7–4.0)
MCHC: 33.9 g/dL (ref 30.0–36.0)
MCV: 99.3 fl (ref 78.0–100.0)
Monocytes Absolute: 0.6 10*3/uL (ref 0.1–1.0)
Monocytes Relative: 7.7 % (ref 3.0–12.0)
Neutro Abs: 3.1 10*3/uL (ref 1.4–7.7)
Neutrophils Relative %: 37.5 % — ABNORMAL LOW (ref 43.0–77.0)
Platelets: 146 10*3/uL — ABNORMAL LOW (ref 150.0–400.0)
RBC: 4.82 Mil/uL (ref 4.22–5.81)
RDW: 14.2 % (ref 11.5–15.5)
WBC: 8.3 10*3/uL (ref 4.0–10.5)

## 2015-03-05 LAB — BASIC METABOLIC PANEL
BUN: 12 mg/dL (ref 6–23)
CO2: 33 mEq/L — ABNORMAL HIGH (ref 19–32)
CREATININE: 0.91 mg/dL (ref 0.40–1.50)
Calcium: 10.1 mg/dL (ref 8.4–10.5)
Chloride: 100 mEq/L (ref 96–112)
GFR: 87.85 mL/min (ref >60.00)
Glucose, Bld: 108 mg/dL — ABNORMAL HIGH (ref 70–99)
POTASSIUM: 4.4 meq/L (ref 3.5–5.1)
Sodium: 142 mEq/L (ref 135–145)

## 2015-03-05 LAB — LIPID PANEL
CHOL/HDL RATIO: 3
Cholesterol: 162 mg/dL (ref 0–200)
HDL: 63.2 mg/dL (ref >39.00)
LDL Cholesterol: 68 mg/dL (ref 0–99)
NONHDL: 99.14
Triglycerides: 157 mg/dL — ABNORMAL HIGH (ref 0.0–149.0)
VLDL: 31.4 mg/dL (ref 0.0–40.0)

## 2015-03-05 LAB — TSH: TSH: 1.06 u[IU]/mL (ref 0.35–4.50)

## 2015-03-05 LAB — PSA: PSA: 0.67 ng/mL (ref 0.10–4.00)

## 2015-03-05 LAB — HEMOGLOBIN A1C: HEMOGLOBIN A1C: 6.2 % (ref 4.6–6.5)

## 2015-03-05 MED ORDER — GLUCOSE BLOOD VI STRP: ORAL_STRIP | Status: DC

## 2015-03-05 NOTE — Progress Notes (Signed)
Pre visit review using our clinic review tool, if applicable. No additional management support is needed unless otherwise documented below in the visit note. 

## 2015-03-05 NOTE — Patient Instructions (Signed)
It was great seeing you again today!  I will follow up with you regarding your labs.   Follow up with me in three months.

## 2015-03-05 NOTE — Progress Notes (Signed)
Subjective:  Patient presents today for their annual wellness visit as well as complete physical.     He continues to smoke, he understands the complications from smoking but does not wish to quit at this time.   He has started to go to see [facility] for his chronic [facility] pain. He has follow up with them in one month. Has not had any Narcotics in "a month". Feels worse in the morning but back pain diminishes throughout the day.   Preventive Screening-Counseling & Management  Smoking Status: Current Smoker Second Hand Smoking status: No smokers in home  Risk Factors Regular exercise: No Diet: Does not follow diet Fall Risk: None   Cardiac risk factors:  advanced age (older than 63 for men, 32 for women)  Hyperlipidemia  diabetes.  Family History: HTN, Heart Disease  Depression Screen None. PHQ2 0   Activities of Daily Living Independent ADLs and IADLs   Hearing Difficulties:- Endorses that he has trouble understanding and hearing people.   Cognitive Testing No reported trouble.   Normal 3 word recall  List the Names of Other Physician/Practitioners you currently use: 1.Dr. Joya Gaskins - Pulmonology 2. Optho - Dr. Tommy Rainwater - 03/05/2015  Immunization History  Administered Date(s) Administered  . Influenza Split 04/24/2011, 04/07/2012  . Influenza,inj,Quad PF,36+ Mos 04/06/2013, 03/03/2014  . Pneumococcal Conjugate-13 05/11/2014  . Pneumococcal Polysaccharide-23 01/20/2013  . Td 07/07/2004   Required Immunizations needed today: High dose Flu - will call when he have this in stock  Screening tests- up to date Health Maintenance Due  Topic Date Due  . Hepatitis C Screening  Feb 01, 1946  . ZOSTAVAX  05/10/2006  . COLONOSCOPY  01/17/2013  . TETANUS/TDAP  07/07/2014  . HEMOGLOBIN A1C  09/03/2014  . INFLUENZA VACCINE  02/05/2015  . URINE MICROALBUMIN  03/04/2015    ROS- Pertinent positives discovered in  course of AWV  1) Impaired hearing 2) Feels as though he has issues with his memory and that he misplaces items around the house on occasion.   The following were reviewed and entered/updated in epic: Past Medical History  Diagnosis Date  . Allergic rhinitis   . Hyperlipidemia   . Hypertension   . Tobacco abuse   . Anxiety   . Depression   . Aneurysm   . DM type 2 (diabetes mellitus, type 2)    Patient Active Problem List   Diagnosis Date Noted  . Coronary artery disease due to calcified coronary lesion 06/15/2014  . COPD with emphysema 06/15/2014  . Cancer screening 06/08/2014  . Back pain, chronic 04/06/2013  . Diabetes type 2, controlled 08/05/2011  . 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 Anxiety state 01/19/2008  . TOBACCO ABUSE 01/19/2008  . Depression with anxiety 01/19/2008  . Hyperlipidemia, group A 12/09/2007  . GENERALIZED ANXIETY DISORDER 12/09/2007  . Essential hypertension 12/09/2007  . Allergic rhinitis 12/09/2007   Past Surgical History  Procedure Laterality Date  . Lumbar disc surgery    . Corneal transplant surgery left eye    . Lumbar laminectomy      Family History  Problem Relation Age of Onset  . Depression Other   . Hypertension Other   . Heart disease Father   . Alcohol abuse Father   . Cancer Mother     intestines    Medications- reviewed and updated Current Outpatient Prescriptions  Medication Sig Dispense Refill  . albuterol (PROAIR HFA) 108 (90 BASE)  MCG/ACT inhaler INHALE 2 PUFFS INTO THE LUNGS EVERY 4 (FOUR) HOURS AS NEEDED FOR WHEEZING. 18 g 6  . aspirin 81 MG tablet Take 81 mg by mouth daily.      . diazepam (VALIUM) 5 MG tablet TAKE 1 TABLET BY MOUTH 3 TIMES A DAY 100 tablet 0  . escitalopram (LEXAPRO) 20 MG tablet TAKE 1 TABLET BY MOUTH EVERY DAY 90 tablet 1  . fluocinonide-emollient (LIDEX-E) 0.05 % cream Apply topically 2 (two) times daily. 60 g 2  . glucose blood (ONETOUCH VERIO) test  strip Check twice daily. 100 each 12  . HYDROcodone-acetaminophen (NORCO) 7.5-325 MG per tablet Take 1 tablet by mouth every 8 (eight) hours as needed. 90 tablet 0  . losartan-hydrochlorothiazide (HYZAAR) 100-25 MG per tablet TAKE 1 TABLET EVERY DAY 100 tablet 3  . metFORMIN (GLUCOPHAGE) 1000 MG tablet TAKE 1 TABLET BY MOUTH TWICE A DAY 200 tablet 3  . ONETOUCH DELICA LANCETS FINE MISC Test twice daily. 100 each 5  . simvastatin (ZOCOR) 40 MG tablet TAKE 1 TABLET AT BEDTIME 100 tablet 3  . triamcinolone cream (KENALOG) 0.5 % APPLY TOPICALLY TWICE A DAY 30 g 0   No current facility-administered medications for this visit.    Allergies-reviewed and updated No Known Allergies  Social History   Social History  . Marital Status: Divorced    Spouse Name: N/A  . Number of Children: N/A  . Years of Education: N/A   Occupational History  . sales    Social History Main Topics  . Smoking status: Current Every Day Smoker -- 1.00 packs/day    Types: Cigarettes  . Smokeless tobacco: Never Used     Comment: used to smoke 3 ppd - currently smoking 1 ppd - started smoking at age 89.  Marland Kitchen Alcohol Use: 16.8 oz/week    28 Shots of liquor per week     Comment: Patient thinks he self medicates with alcohol for back pain. Does not drink if taking hydrocodone  . Drug Use: No  . Sexual Activity: Not Asked   Other Topics Concern  . None   Social History Narrative   Retired - Diplomatic Services operational officer by himself   Has a dog       Objective: BP 100/68 mmHg  Temp(Src) 98.7 F (37.1 C) (Oral)  Ht 6\' 2"  (1.88 m)  Wt 242 lb 6.4 oz (109.952 kg)  BMI 31.11 kg/m2 Physical Exam  Constitutional: He is oriented to person, place, and time. He appears well-developed and well-nourished.  HENT:  Head: Normocephalic and atraumatic.  Right Ear: External ear normal.  Left Ear: External ear normal.  Nose: Nose normal.  Mouth/Throat: Oropharynx is clear and moist.  Eyes: Conjunctivae and EOM are normal. Pupils  are equal, round, and reactive to light.  Neck: Normal range of motion. Neck supple. No JVD present. No tracheal deviation present. No thyromegaly present.  Cardiovascular: Normal rate, regular rhythm, normal heart sounds and intact distal pulses. Exam reveals no gallop and no friction rub.  No murmur heard. No carotid nor aortic bruits peripheral pulses 1+ and symmetrical  Pulmonary/Chest: Effort normal and breath sounds normal. No stridor. No respiratory distress. He has no wheezes. He has no rales. He exhibits no tenderness.  Abdominal: Soft. Bowel sounds are normal. He exhibits no distension and no mass. There is no tenderness. There is no rebound and no guarding.  Genitourinary: Rectum normal and penis normal. Guaiac negative stool. No penile tenderness.  1+ symmetrical BPH  Musculoskeletal: Normal range of motion. He exhibits no edema or tenderness.  Lymphadenopathy:   He has no cervical adenopathy.  Neurological: He is alert and oriented to person, place, and time. He has normal reflexes. No cranial nerve deficit. He exhibits normal muscle tone.  Skin: Skin is warm and dry. No rash noted. No erythema. No pallor.  Total body skin exam normal  Psychiatric: He has a normal mood and affect. His behavior is normal. Judgment and thought content normal.  Nursing note and vitals reviewed.  Assessment/Plan:  1. Routine general medical examination at a health care facility  - Ambulatory referral to Audiology - Basic metabolic panel - CBC with Differential - PSA - Hemoglobin A1c - Hepatic function panel - Lipid panel - POCT urinalysis dipstick - TSH  2. Essential hypertension - BP well controlled, in office today 154/00 - Basic metabolic panel - CBC with Differential - Hemoglobin A1c - Hepatic function panel - Lipid panel - POCT urinalysis dipstick - TSH - Consider decreasing medication.   3. Diabetes type 2, controlled - Basic metabolic panel - CBC with Differential -  Hemoglobin A1c - Lipid panel - POCT urinalysis dipstick - Still not monitoring blood sugars at home due to unable to operate glucometer despite being shown how to use it by Alisha and the diabetic educator.  - Will show how to operate glucometer again in the office today and have him repeat back how to use it.   4. Back pain, chronic - Continue follow up with pain clinic.  - Consider SNRI- patient will find out which on his formulary will be covered and then let me know.   5. Hearing difficulty, bilateral - Ambulatory referral to Audiology   No problem-specific assessment & plan notes found for this encounter.  Return precautions advised.   Orders Placed This Encounter  Procedures  . Basic metabolic panel    Order Specific Question:  Has the patient fasted?    Answer:  Yes  . CBC with Differential  . PSA  . Hemoglobin A1c  . Hepatic function panel  . Lipid panel    Order Specific Question:  Has the patient fasted?    Answer:  Yes  . TSH  . Ambulatory referral to Audiology    Referral Priority:  Routine    Referral Type:  Audiology Exam    Referral Reason:  Specialty Services Required    Number of Visits Requested:  1  . POCT urinalysis dipstick    Meds ordered this encounter  Medications  . glucose blood (ONETOUCH VERIO) test strip    Sig: Check twice daily.    Dispense:  100 each    Refill:  12

## 2015-03-05 NOTE — Progress Notes (Deleted)
   Subjective:    Patient ID: Drew Burke, male    DOB: 1945-09-02, 69 y.o.   MRN: [id number]  HPI    Review of Systems     Objective:   Physical Exam        Assessment & Plan:

## 2015-03-06 ENCOUNTER — Telehealth: Payer: Self-pay

## 2015-03-06 NOTE — Addendum Note (Signed)
Addended by: Apolinar Junes on: 03/06/2015 09:29 AM   Modules accepted: Orders

## 2015-03-06 NOTE — Telephone Encounter (Signed)
Urine drug screen is positive for  Alcohol.  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

## 2015-03-06 NOTE — Telephone Encounter (Signed)
Pt left a vm and states he called his insurance company and duloxetin is covered and it comes in 20 mg, 30 mg, and 60 mg.  Pt states the price is the same no matter the dose.  Please advise and call in to [facility].

## 2015-03-06 NOTE — Telephone Encounter (Signed)
Would not prescribe hydrocodone in this case  May give list of other clinics

## 2015-03-06 NOTE — Progress Notes (Signed)
Urine drug screen is positive for Alcohol. 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

## 2015-03-07 ENCOUNTER — Other Ambulatory Visit: Payer: Self-pay | Admitting: Adult Health

## 2015-03-07 MED ORDER — DULOXETINE HCL 60 MG PO CPEP: 60.0000 mg | ORAL_CAPSULE | Freq: Every day | ORAL | Status: DC

## 2015-03-07 NOTE — Telephone Encounter (Signed)
Notified Mr Dyar

## 2015-03-08 ENCOUNTER — Telehealth: Payer: Self-pay

## 2015-03-08 NOTE — Telephone Encounter (Signed)
Called to speak with pt about signing up for his high dose flu vaccine.  Advised pt that we have a flu clinic on 9.2.2016. Pt states he wants Tommi Rumps to know he gave the pain clinic a urine specimen and he was turned down because he had alcohol in his system.  Pt states he had a few drinks the night before and he did not know that they would be testing for that.  Pt states he has a few drinks every now and then and he needs they hydrocodone over the alcohol.  Pt stated that the pain clinic told him they could recommenced other pain clinics for pt to go to.  Pt states he called them today and left a message for them to call with additional information.  Pt wanted Tommi Rumps to be aware.

## 2015-03-20 ENCOUNTER — Telehealth: Payer: Self-pay | Admitting: *Deleted

## 2015-03-20 NOTE — Telephone Encounter (Signed)
Drew Burke called to inform that he is cancelling his upcoming appt due to the fact that we will not be prescribing pain medication.   FYI

## 2015-03-22 ENCOUNTER — Ambulatory Visit: Payer: Medicare Other | Admitting: Physical Medicine & Rehabilitation

## 2015-03-26 ENCOUNTER — Telehealth: Payer: Self-pay

## 2015-03-26 ENCOUNTER — Telehealth: Payer: Self-pay | Admitting: Adult Health

## 2015-03-26 ENCOUNTER — Other Ambulatory Visit: Payer: Self-pay | Admitting: Adult Health

## 2015-03-26 MED ORDER — ESCITALOPRAM OXALATE 20 MG PO TABS: 20.0000 mg | ORAL_TABLET | Freq: Every day | ORAL | Status: DC

## 2015-03-26 NOTE — Telephone Encounter (Signed)
Pt called in this am stating that you change his Lexapro 20mg  to something different of which he does not remember the name. He states this new med has cause his balance to be off and he thinks he fracture a rib but did not go to the hospital. He took this med for 5 days but stop after he starting feeling off balance. I offer him an appt today but he did not want to come in.

## 2015-03-26 NOTE — Telephone Encounter (Signed)
Spoke to Mr. Signorelli on the phone. He endorses that he fells better today but that the Cymbalta has made him lose his balance on a few occassions. Per patient he lost his balance and hit his flank on the floor. He is unsure if he had any fractures as he did not go to the ER. He did not hit his head or have any LOC. He has not had Cymbalta in three days and is starting to feel better. He refused an office appointment to evaluate his rib pain and dizziness.   Will send in new prescription for Lexapro 20 mg

## 2015-03-28 ENCOUNTER — Ambulatory Visit (INDEPENDENT_AMBULATORY_CARE_PROVIDER_SITE_OTHER)
Admission: RE | Admit: 2015-03-28 | Discharge: 2015-03-28 | Disposition: A | Discharge status: Home / Self Care | Payer: Medicare Other | Source: Ambulatory Visit | Attending: Adult Health | Admitting: Adult Health

## 2015-03-28 ENCOUNTER — Encounter: Payer: Self-pay | Admitting: Adult Health

## 2015-03-28 ENCOUNTER — Other Ambulatory Visit: Payer: Self-pay | Admitting: Adult Health

## 2015-03-28 ENCOUNTER — Ambulatory Visit (INDEPENDENT_AMBULATORY_CARE_PROVIDER_SITE_OTHER): Payer: Medicare Other | Admitting: Adult Health

## 2015-03-28 VITALS — BP 115/70 | HR 73 | Wt 235.8 lb

## 2015-03-28 DIAGNOSIS — G8929 Other chronic pain: Secondary | ICD-10-CM

## 2015-03-28 DIAGNOSIS — Z23 Encounter for immunization: Secondary | ICD-10-CM | POA: Diagnosis not present

## 2015-03-28 DIAGNOSIS — M549 Dorsalgia, unspecified: Secondary | ICD-10-CM | POA: Diagnosis not present

## 2015-03-28 DIAGNOSIS — R109 Unspecified abdominal pain: Secondary | ICD-10-CM | POA: Diagnosis not present

## 2015-03-28 DIAGNOSIS — R10A1 Flank pain, right side: Secondary | ICD-10-CM

## 2015-03-28 MED ORDER — HYDROCODONE-ACETAMINOPHEN 7.5-325 MG PO TABS: 1.0000 | ORAL_TABLET | Freq: Three times a day (TID) | ORAL | Status: DC | PRN

## 2015-03-28 NOTE — Patient Instructions (Addendum)
I will follow up with you regarding your chest xray   Follow up with me if dizziness has not resolved in the next 2 days.   Take the Norco every 8 hours as needed. Ibuprofen and heat are the best treatments for this type of pain. Continue to take deep breaths.     Rib Fracture A rib fracture is a break or crack in one of the bones of the ribs. The ribs are a group of long, curved bones that wrap around your chest and attach to your spine. They protect your lungs and other organs in the chest cavity. A broken or cracked rib is often painful, but most do not cause other problems. Most rib fractures heal on their own over time. However, rib fractures can be more serious if multiple ribs are broken or if broken ribs move out of place and push against other structures. CAUSES   A direct blow to the chest. For example, this could happen during contact sports, a car accident, or a fall against a hard object.  Repetitive movements with high force, such as pitching a baseball or having severe coughing spells. SYMPTOMS   Pain when you breathe in or cough.  Pain when someone presses on the injured area. DIAGNOSIS  Your caregiver will perform a physical exam. Various imaging tests may be ordered to confirm the diagnosis and to look for related injuries. These tests may include a chest X-ray, computed tomography (CT), magnetic resonance imaging (MRI), or a bone scan. TREATMENT  Rib fractures usually heal on their own in 1-3 months. The longer healing period is often associated with a continued cough or other aggravating activities. During the healing period, pain control is very important. Medication is usually given to control pain. Hospitalization or surgery may be needed for more severe injuries, such as those in which multiple ribs are broken or the ribs have moved out of place.  HOME CARE INSTRUCTIONS   Avoid strenuous activity and any activities or movements that cause pain. Be careful during  activities and avoid bumping the injured rib.  Gradually increase activity as directed by your caregiver.  Only take over-the-counter or prescription medications as directed by your caregiver. Do not take other medications without asking your caregiver first.  Apply ice to the injured area for the first 1-2 days after you have been treated or as directed by your caregiver. Applying ice helps to reduce inflammation and pain.  Put ice in a plastic bag.  Place a towel between your skin and the bag.   Leave the ice on for 15-20 minutes at a time, every 2 hours while you are awake.  Perform deep breathing as directed by your caregiver. This will help prevent pneumonia, which is a common complication of a broken rib. Your caregiver may instruct you to:  Take deep breaths several times a day.  Try to cough several times a day, holding a pillow against the injured area.  Use a device called an incentive spirometer to practice deep breathing several times a day.  Drink enough fluids to keep your urine clear or pale yellow. This will help you avoid constipation.   Do not wear a rib belt or binder. These restrict breathing, which can lead to pneumonia.  SEEK IMMEDIATE MEDICAL CARE IF:   You have a fever.   You have difficulty breathing or shortness of breath.   You develop a continual cough, or you cough up thick or bloody sputum.  You feel sick  to your stomach (nausea), throw up (vomit), or have abdominal pain.   You have worsening pain not controlled with medications.  MAKE SURE YOU:  Understand these instructions.  Will watch your condition.  Will get help right away if you are not doing well or get worse. Document Released: 06/23/2005 Document Revised: 02/23/2013 Document Reviewed: 08/25/2012 [facility] Patient Information 2015 [place], [place]. This information is not intended to replace advice given to you by your health care provider. Make sure you discuss any questions  you have with your health care provider.

## 2015-03-28 NOTE — Progress Notes (Signed)
Patient received education resource, including the self-management goal and tool. Patient verbalized understanding. 

## 2015-03-28 NOTE — Progress Notes (Signed)
Subjective:    Patient ID: Drew Burke, male    DOB: 1945/10/27, 69 y.o.   MRN: [id number]  HPI 69 year old male who presents to the office today for pain to left flank from fall he suffered late last week. I had prescribed him Cymbalta for depression and to help with chronic pain. About three days after starting Cymbalta he became very dizzy and his balance was off. He endorses falling " 9 times", injuring his left flank, " I think I may have broke a rib". He fell on the floor.  Pain is worse when he takes a deep breath and when he coughs.   Denies LOC  He has bruising in various stages of healing on his left knee,  right flank and right knee.   Since he has gone off the Cymbalta he feels like his balance and coordination are getting better. He still feels dizzy on occassion   Review of Systems  Respiratory: Negative.   Cardiovascular: Negative.   Musculoskeletal: Positive for myalgias, back pain and arthralgias. Negative for joint swelling, gait problem, neck pain and neck stiffness.  Neurological: Positive for dizziness and weakness. Negative for speech difficulty, light-headedness, numbness and headaches.  All other systems reviewed and are negative.  Past Medical History  Diagnosis Date  . Allergic rhinitis   . Hyperlipidemia   . Hypertension   . Tobacco abuse   . Anxiety   . Depression   . Aneurysm   . DM type 2 (diabetes mellitus, type 2)     Social History   Social History  . Marital Status: Divorced    Spouse Name: N/A  . Number of Children: N/A  . Years of Education: N/A   Occupational History  . sales    Social History Main Topics  . Smoking status: Current Every Day Smoker -- 1.00 packs/day    Types: Cigarettes  . Smokeless tobacco: Never Used     Comment: used to smoke 3 ppd - currently smoking 1 ppd - started smoking at age 19.  Marland Kitchen Alcohol Use: 16.8 oz/week    28 Shots of liquor per week     Comment: Patient thinks he self medicates with alcohol  for back pain. Does not drink if taking hydrocodone  . Drug Use: No  . Sexual Activity: Not on file   Other Topics Concern  . Not on file   Social History Narrative   Retired - Diplomatic Services operational officer by himself   Has a dog       Past Surgical History  Procedure Laterality Date  . Lumbar disc surgery    . Corneal transplant surgery left eye    . Lumbar laminectomy      Family History  Problem Relation Age of Onset  . Depression Other   . Hypertension Other   . Heart disease Father   . Alcohol abuse Father   . Cancer Mother     intestines    No Known Allergies  Current Outpatient Prescriptions on File Prior to Visit  Medication Sig Dispense Refill  . albuterol (PROAIR HFA) 108 (90 BASE) MCG/ACT inhaler INHALE 2 PUFFS INTO THE LUNGS EVERY 4 (FOUR) HOURS AS NEEDED FOR WHEEZING. 18 g 6  . aspirin 81 MG tablet Take 81 mg by mouth daily.      . diazepam (VALIUM) 5 MG tablet TAKE 1 TABLET BY MOUTH 3 TIMES A DAY 100 tablet 0  . escitalopram (LEXAPRO) 20 MG tablet Take 1 tablet (  20 mg total) by mouth daily. 30 tablet 6  . glucose blood (ONETOUCH VERIO) test strip Check twice daily. 100 each 12  . losartan-hydrochlorothiazide (HYZAAR) 100-25 MG per tablet TAKE 1 TABLET EVERY DAY 100 tablet 3  . metFORMIN (GLUCOPHAGE) 1000 MG tablet TAKE 1 TABLET BY MOUTH TWICE A DAY 200 tablet 3  . ONETOUCH DELICA LANCETS FINE MISC Test twice daily. 100 each 5  . simvastatin (ZOCOR) 40 MG tablet TAKE 1 TABLET AT BEDTIME 100 tablet 3  . triamcinolone cream (KENALOG) 0.5 % APPLY TOPICALLY TWICE A DAY 30 g 0   No current facility-administered medications on file prior to visit.    BP 115/70 mmHg  Pulse 73  Wt 235 lb 12.8 oz (106.958 kg)       Objective:   Physical Exam  Constitutional: He is oriented to person, place, and time. He appears well-developed and well-nourished. No distress.  HENT:  Head: Normocephalic and atraumatic.  Right Ear: External ear normal.  Left Ear: External ear normal.    Nose: Nose normal.  Mouth/Throat: Oropharynx is clear and moist. No oropharyngeal exudate.  Eyes: Right eye exhibits no discharge. Left eye exhibits no discharge.  Cardiovascular: Normal rate, regular rhythm, normal heart sounds and intact distal pulses.  Exam reveals no gallop and no friction rub.   No murmur heard. Pulmonary/Chest: Effort normal and breath sounds normal. No respiratory distress. He has no wheezes. He has no rales. He exhibits no tenderness.  Musculoskeletal: Normal range of motion. He exhibits tenderness.  Slight tenderness to right upper flank   Neurological: He is alert and oriented to person, place, and time.  Skin: Skin is warm and dry. No rash noted. He is not diaphoretic. No erythema. No pallor.  Bruising in various stages to left knee and right flank  Psychiatric: He has a normal mood and affect. His behavior is normal. Judgment and thought content normal.  Nursing note and vitals reviewed.      Assessment & Plan:  1. Right flank pain - DG Chest 2 View; Future - HYDROcodone-acetaminophen (NORCO) 7.5-325 MG per tablet; Take 1 tablet by mouth every 8 (eight) hours as needed.  Dispense: 30 tablet; Refill: 0 - Follow up if dizziness does not resolve in 2 days. Dizziness likely due to Cymbalta, he endorses feeling better after stopping. No inner ear abnormalities on exam.   2. Need for prophylactic vaccination and inoculation against influenza - Flu Vaccine QUAD 36+ mos IM  3. Back pain, chronic - Ambulatory referral to Pain Clinic - HYDROcodone-acetaminophen (NORCO) 7.5-325 MG per tablet; Take 1 tablet by mouth every 8 (eight) hours as needed.  Dispense: 30 tablet; Refill: 0

## 2015-03-29 ENCOUNTER — Other Ambulatory Visit: Payer: Self-pay | Admitting: Adult Health

## 2015-03-29 ENCOUNTER — Telehealth: Payer: Self-pay | Admitting: Adult Health

## 2015-03-29 MED ORDER — DIAZEPAM 5 MG PO TABS: 5.0000 mg | ORAL_TABLET | Freq: Three times a day (TID) | ORAL | Status: DC

## 2015-03-29 NOTE — Telephone Encounter (Signed)
rx called in

## 2015-03-29 NOTE — Telephone Encounter (Signed)
Spoke to patient about his x ray. No fracture.   Will call in new Valium prescription

## 2015-03-29 NOTE — Telephone Encounter (Signed)
Pt needs refill on diazepam #90 sent to [facility] [address]

## 2015-03-29 NOTE — Telephone Encounter (Signed)
Pt would like results of Xray from yesterday asap. Would prefer to hear from cory.  Also pt states he has requested refill of diazepam (VALIUM) 5 MG tablet. Pt has called pharm and there is a request in for that [facility]ill [facility] [address]

## 2015-03-29 NOTE — Telephone Encounter (Signed)
See below

## 2015-03-30 ENCOUNTER — Telehealth: Payer: Self-pay | Admitting: Family Medicine

## 2015-03-30 NOTE — Telephone Encounter (Signed)
Pt called to check on his referral for pain management clinic.

## 2015-04-02 ENCOUNTER — Other Ambulatory Visit: Payer: Self-pay | Admitting: Adult Health

## 2015-04-03 ENCOUNTER — Telehealth: Payer: Self-pay | Admitting: *Deleted

## 2015-04-03 ENCOUNTER — Telehealth: Payer: Self-pay | Admitting: Adult Health

## 2015-04-03 NOTE — Telephone Encounter (Signed)
Patient called in requesting information on new Pain Management Clinic. Was told we would search for him a new clinic and provide the information. Please advise.   Patient return call # [phone]

## 2015-04-03 NOTE — Telephone Encounter (Signed)
Per referral note:   Pt did not want to to go to [facility]  He wanted to go to  [facility]  [address], [place], [place] [address]  [phone] Faxed notes and referral once review their office will contact pt to schedule directly

## 2015-04-03 NOTE — Telephone Encounter (Signed)
Called and patient is aware  

## 2015-04-03 NOTE — Telephone Encounter (Signed)
Called the pharmacist and pt's prescription was not received the rx for diazepam. Called and spoke with the pharmacist and rx was called in.

## 2015-04-03 NOTE — Telephone Encounter (Signed)
Pt request refill of the following: diazepam (VALIUM) 5 MG tablet    90 PILLS   Phamacy: [facility] [address]

## 2015-04-09 ENCOUNTER — Telehealth: Payer: Self-pay

## 2015-04-09 ENCOUNTER — Other Ambulatory Visit: Payer: Self-pay | Admitting: Adult Health

## 2015-04-09 MED ORDER — ESCITALOPRAM OXALATE 20 MG PO TABS: 20.0000 mg | ORAL_TABLET | Freq: Every day | ORAL | Status: DC

## 2015-04-09 NOTE — Telephone Encounter (Signed)
Called and spoke with pt and pt is aware.  [facility] and d/c Cymbalta prescription.

## 2015-04-09 NOTE — Telephone Encounter (Signed)
I did send in the Lexapro but will send it again.   Do not take the Cymbalta due to the side effects you have experienced.

## 2015-04-09 NOTE — Telephone Encounter (Signed)
Pt called and states the generic Cymbalta that was sent to the pharmacy has had pt falling multiple times.  Pt just went to the pharmacy and picked up some other medications and the generic Cymbalta was in the bag.  Advised pt that he had called the office on 8.30.2016 to inform Tommi Rumps that his insurance wanted him to change medications.  Pt states he does not remember that conversation.  Pt would like to switch back to generic Lexapro.  Pls advise and send to pharmacy.

## 2015-04-24 ENCOUNTER — Other Ambulatory Visit: Payer: Self-pay | Admitting: Acute Care

## 2015-04-24 DIAGNOSIS — F17210 Nicotine dependence, cigarettes, uncomplicated: Secondary | ICD-10-CM

## 2015-05-14 ENCOUNTER — Other Ambulatory Visit: Payer: Self-pay | Admitting: Acute Care

## 2015-05-14 DIAGNOSIS — F17210 Nicotine dependence, cigarettes, uncomplicated: Secondary | ICD-10-CM

## 2015-05-16 ENCOUNTER — Other Ambulatory Visit: Payer: Self-pay | Admitting: Family Medicine

## 2015-05-17 ENCOUNTER — Ambulatory Visit (INDEPENDENT_AMBULATORY_CARE_PROVIDER_SITE_OTHER): Payer: Medicare Other | Admitting: Acute Care

## 2015-05-17 ENCOUNTER — Ambulatory Visit (INDEPENDENT_AMBULATORY_CARE_PROVIDER_SITE_OTHER)
Admission: RE | Admit: 2015-05-17 | Discharge: 2015-05-17 | Disposition: A | Discharge status: Home / Self Care | Payer: Medicare Other | Source: Ambulatory Visit | Attending: Acute Care | Admitting: Acute Care

## 2015-05-17 ENCOUNTER — Encounter: Payer: Self-pay | Admitting: Acute Care

## 2015-05-17 DIAGNOSIS — F17210 Nicotine dependence, cigarettes, uncomplicated: Secondary | ICD-10-CM | POA: Diagnosis not present

## 2015-05-17 NOTE — Progress Notes (Signed)
Shared Decision Making Visit Lung Cancer Screening Program [id number])   Eligibility:  Age 69 y.o.  Pack Years Smoking History Calculation : 53 pack years (# packs/per year x # years smoked)  Recent History of coughing up blood  no  Unexplained weight loss? no ( >Than 15 pounds within the last 6 months )  Prior History Lung / other cancer no (Diagnosis within the last 5 years already requiring surveillance chest CT Scans).  Smoking Status Current Smoker  Former Smokers: Years since quit: NA  Quit Date: NA  Visit Components:  Discussion included one or more decision making aids. yes  Discussion included risk/benefits of screening. yes  Discussion included potential follow up diagnostic testing for abnormal scans. yes  Discussion included meaning and risk of over diagnosis. yes  Discussion included meaning and risk of False Positives. yes  Discussion included meaning of total radiation exposure. yes  Counseling Included:  Importance of adherence to annual lung cancer LDCT screening. yes  Impact of comorbidities on ability to participate in the program. yes  Ability and willingness to under diagnostic treatment. yes  Smoking Cessation Counseling:  Current Smokers:   Discussed importance of smoking cessation. yes  Information about tobacco cessation classes and interventions provided to patient. yes  Patient provided with "ticket" for LDCT Scan. yes  Symptomatic Patient. no  CounselingNA   Diagnosis Code: Tobacco Use Z72.0  Asymptomatic Patient yes  Counseling (Intermediate counseling: > three minutes counseling) [id number]  Former Smokers:   Discussed the importance of maintaining cigarette abstinence. NA; current smoker  Diagnosis Code: Personal History of Nicotine Dependence. [id number]  Information about tobacco cessation classes and interventions provided to patient. Yes  Patient provided with "ticket" for LDCT Scan. yes  Written Order for Lung Cancer  Screening with LDCT placed in Epic. Yes (CT Chest Lung Cancer Screening Low Dose W/O CM) [id number] Z12.2-Screening of respiratory organs Z87.891-Personal history of nicotine dependence  I spent 15 minutes of face to face time  With Mr. Quirin discussing the risks and benefits of lung cancer screening. We viewed a power point together that high lighted the topics noted above, pausing at intervals to allow for questions to be asked and answered to ensure understanding.We discussed that the single most powerful action that he can take to decrease his risk of developing  lung cancer is to quit smoking. He stated that he has tried unsuccessfully to quit smoking many times. He know he needs to quit, but is not ready to quit. We discussed the role of medication, nicotine replacement , behavior modification and classes and support groups. I have given him the " Be stronger than your excuses " card with resource information on signing up for classes and contact numbers for the free nicotine replacement therapy. I have told him to call either me or his PCP when he is ready to set a quit date, and that I will help him in any way I can to assist him in successfully meeting his goal. We discussed the time and location of the scan, and that I will call him either this afternoon with the results, or Monday. He has verbalized understanding of all of the above , and had no further questions upon leaving the office. I have given him my card and contact information in the event he has questions and needs to contact me in the future.   Magdalen Spatz, NP

## 2015-05-18 ENCOUNTER — Inpatient Hospital Stay: Admission: RE | Admit: 2015-05-18 | Payer: Self-pay | Source: Ambulatory Visit

## 2015-05-18 ENCOUNTER — Encounter: Payer: Self-pay | Admitting: Acute Care

## 2015-05-21 ENCOUNTER — Telehealth: Payer: Self-pay | Admitting: Acute Care

## 2015-05-21 NOTE — Telephone Encounter (Signed)
[name] called for the results of his LDCT screening scan. I explained that the CT was read as a Lung RADS 2, nodules with a very low likelihood of becoming a clinically active cancer due to size or lack of growth. I explained that the recommendation is for a follow up CT in 12 months which we will call and schedule about 3 weeks before it is due in Nov. 2017. [name] verbalized understanding of all of the above and had no further questions upon ending the call. I did remind him to call if there were any changes in his health so we could get him assessed sooner if necessary.He has my contact information in the event he has any further questions in the future. I have messaged Sallee Provencal , [place] with these results, as he is the referring provider.

## 2015-05-24 ENCOUNTER — Other Ambulatory Visit: Payer: Self-pay | Admitting: Adult Health

## 2015-05-24 NOTE — Telephone Encounter (Signed)
Ok to refill 

## 2015-06-05 ENCOUNTER — Ambulatory Visit: Payer: Self-pay | Admitting: Adult Health

## 2015-06-06 ENCOUNTER — Encounter: Payer: Self-pay | Admitting: Adult Health

## 2015-06-06 ENCOUNTER — Ambulatory Visit (INDEPENDENT_AMBULATORY_CARE_PROVIDER_SITE_OTHER): Payer: Medicare Other | Admitting: Adult Health

## 2015-06-06 VITALS — BP 134/70 | Temp 98.1°F | Ht 74.0 in | Wt 236.0 lb

## 2015-06-06 DIAGNOSIS — Z789 Other specified health status: Secondary | ICD-10-CM

## 2015-06-06 DIAGNOSIS — Z7289 Other problems related to lifestyle: Secondary | ICD-10-CM

## 2015-06-06 DIAGNOSIS — G3184 Mild cognitive impairment, so stated: Secondary | ICD-10-CM | POA: Diagnosis not present

## 2015-06-06 DIAGNOSIS — E118 Type 2 diabetes mellitus with unspecified complications: Secondary | ICD-10-CM | POA: Diagnosis not present

## 2015-06-06 LAB — HEMOGLOBIN A1C: Hgb A1c MFr Bld: 6.6 % — ABNORMAL HIGH (ref 4.6–6.5)

## 2015-06-06 LAB — CBC WITH DIFFERENTIAL/PLATELET
BASOS ABS: 0 10*3/uL (ref 0.0–0.1)
Basophils Relative: 0.1 % (ref 0.0–3.0)
EOS ABS: 0.1 10*3/uL (ref 0.0–0.7)
EOS PCT: 1 % (ref 0.0–5.0)
HCT: 46 % (ref 39.0–52.0)
HEMOGLOBIN: 15.1 g/dL (ref 13.0–17.0)
LYMPHS ABS: 5.3 10*3/uL — AB (ref 0.7–4.0)
Lymphocytes Relative: 57.7 % — ABNORMAL HIGH (ref 12.0–46.0)
MCHC: 32.8 g/dL (ref 30.0–36.0)
MCV: 100 fl (ref 78.0–100.0)
MONO ABS: 0.5 10*3/uL (ref 0.1–1.0)
Monocytes Relative: 5.6 % (ref 3.0–12.0)
NEUTROS PCT: 35.6 % — AB (ref 43.0–77.0)
Neutro Abs: 3.3 10*3/uL (ref 1.4–7.7)
Platelets: 155 10*3/uL (ref 150.0–400.0)
RBC: 4.6 Mil/uL (ref 4.22–5.81)
RDW: 13.5 % (ref 11.5–15.5)
WBC: 9.2 10*3/uL (ref 4.0–10.5)

## 2015-06-06 LAB — BASIC METABOLIC PANEL
BUN: 14 mg/dL (ref 6–23)
CALCIUM: 9.9 mg/dL (ref 8.4–10.5)
CHLORIDE: 103 meq/L (ref 96–112)
CO2: 31 meq/L (ref 19–32)
Creatinine, Ser: 0.84 mg/dL (ref 0.40–1.50)
GFR: 96.28 mL/min (ref >60.00)
GLUCOSE: 108 mg/dL — AB (ref 70–99)
POTASSIUM: 4.1 meq/L (ref 3.5–5.1)
SODIUM: 144 meq/L (ref 135–145)

## 2015-06-06 LAB — FOLATE: FOLATE: 5.8 ng/mL — AB (ref >5.9)

## 2015-06-06 LAB — VITAMIN B12: Vitamin B-12: 113 pg/mL — ABNORMAL LOW (ref 211–911)

## 2015-06-06 NOTE — Patient Instructions (Signed)
I will follow up with you regarding your blood work.   Come back next week and see me - Bring your glucometer.

## 2015-06-06 NOTE — Progress Notes (Signed)
Subjective:    Patient ID: Drew Burke, male    DOB: 1945/09/04, 69 y.o.   MRN: [id number]  HPI  69 year old male who presents to the office today for follow up regarding his diabetes and hypertension. He endorses that he is still having trouble remembering how to use his glucometer at home ( he cannot remember which step is confusing to him). He was going to bring the glucometer to the office visit today but he forgot it on the table.   He feels like he is having trouble with his short term memory and is worried that he has dementia. There is no family history of dementia. Denies any issues with long term memory. He is able to hold a conversation of higher level thinking.   He continues to drink daily. States " I only have three drinks a day and it is between 12-2pm".   He has started to see pain management and feels like this is going well over all.     Review of Systems  Constitutional: Negative.   HENT: Negative.   Respiratory: Negative.   Cardiovascular: Negative.   Gastrointestinal: Negative.   Musculoskeletal: Positive for back pain and arthralgias. Negative for gait problem, neck pain and neck stiffness.  Skin: Negative.   Neurological: Negative.   Psychiatric/Behavioral: Positive for confusion and decreased concentration. Negative for suicidal ideas, behavioral problems and sleep disturbance. The patient is not nervous/anxious.   All other systems reviewed and are negative.  Past Medical History  Diagnosis Date  . Allergic rhinitis   . Hyperlipidemia   . Hypertension   . Tobacco abuse   . Anxiety   . Depression   . Aneurysm ([place])   . DM type 2 (diabetes mellitus, type 2) ([place])     Social History   Social History  . Marital Status: Divorced    Spouse Name: N/A  . Number of Children: N/A  . Years of Education: N/A   Occupational History  . sales    Social History Main Topics  . Smoking status: Current Every Day Smoker -- 1.00 packs/day for 54 years    Types: Cigarettes    Start date: 07/07/1960  . Smokeless tobacco: Never Used     Comment: used to smoke 3 ppd - currently smoking 1 ppd - started smoking at age 67.  Marland Kitchen Alcohol Use: 16.8 oz/week    28 Shots of liquor per week     Comment: Patient thinks he self medicates with alcohol for back pain. Does not drink if taking hydrocodone  . Drug Use: No  . Sexual Activity: Not on file   Other Topics Concern  . Not on file   Social History Narrative   Retired - Diplomatic Services operational officer by himself   Has a dog       Past Surgical History  Procedure Laterality Date  . Lumbar disc surgery    . Corneal transplant surgery left eye    . Lumbar laminectomy      Family History  Problem Relation Age of Onset  . Depression Other   . Hypertension Other   . Heart disease Father   . Alcohol abuse Father   . Cancer Mother     intestines    No Known Allergies  Current Outpatient Prescriptions on File Prior to Visit  Medication Sig Dispense Refill  . albuterol (PROAIR HFA) 108 (90 BASE) MCG/ACT inhaler INHALE 2 PUFFS INTO THE LUNGS EVERY 4 (FOUR) HOURS AS NEEDED  FOR WHEEZING. 18 g 6  . aspirin 81 MG tablet Take 81 mg by mouth daily.      . diazepam (VALIUM) 5 MG tablet TAKE 1 TABLET BY MOUTH 3 TIMES A DAY 90 tablet 0  . escitalopram (LEXAPRO) 20 MG tablet Take 1 tablet (20 mg total) by mouth daily. 30 tablet 6  . glucose blood (ONETOUCH VERIO) test strip Check twice daily. 100 each 12  . losartan-hydrochlorothiazide (HYZAAR) 100-25 MG per tablet TAKE 1 TABLET EVERY DAY 100 tablet 3  . metFORMIN (GLUCOPHAGE) 1000 MG tablet TAKE 1 TABLET BY MOUTH TWICE A DAY 200 tablet 2  . ONETOUCH DELICA LANCETS FINE MISC Test twice daily. 100 each 5  . simvastatin (ZOCOR) 40 MG tablet TAKE 1 TABLET AT BEDTIME 100 tablet 3  . triamcinolone cream (KENALOG) 0.5 % APPLY TOPICALLY TWICE A DAY 30 g 0  . HYDROcodone-acetaminophen (NORCO) 7.5-325 MG per tablet Take 1 tablet by mouth every 8 (eight) hours as needed.  (Patient not taking: Reported on 06/06/2015) 30 tablet 0   No current facility-administered medications on file prior to visit.    BP 134/70 mmHg  Temp(Src) 98.1 F (36.7 C) (Oral)  Ht 6\' 2"  (1.88 m)  Wt 236 lb (107.049 kg)  BMI 30.29 kg/m2       Objective:   Physical Exam  Constitutional: He is oriented to person, place, and time. He appears well-developed and well-nourished. No distress.  Neck: Normal range of motion. Neck supple. No thyromegaly present.  Cardiovascular: Normal rate, regular rhythm, normal heart sounds and intact distal pulses.  Exam reveals no gallop and no friction rub.   No murmur heard. Pulmonary/Chest: Effort normal and breath sounds normal. No respiratory distress. He has no wheezes. He has no rales. He exhibits no tenderness.  Lymphadenopathy:    He has no cervical adenopathy.  Neurological: He is alert and oriented to person, place, and time. Coordination normal.  Mini Mental 30/30 Slight resting tremor in upper extremities L>R  Skin: Skin is warm and dry. No rash noted. He is not diaphoretic. No erythema. There is pallor.  Appears more pale than normal   Psychiatric: He has a normal mood and affect. His behavior is normal. Judgment and thought content normal.  Nursing note and vitals reviewed.     Assessment & Plan:  1. Alcohol use ([place]) - Concern that he is drinking more than he is telling me. Which is likely factoring into his cognitive impairment.  - Hemoglobin A1c - CBC with Differential/Platelet - Basic metabolic panel - Vitamin 123456 - Folate  2. Mild cognitive impairment - Mini Mental 30/30. I did not notice any signs of cognitive impairment during this exam. He is a college educated person and is able to function at a higher level. Concern for short term memory loss when it comes to instructions due to him not being able to use his glucometer, even though he has been shown by multiple people, multiple times and has demonstrated in the  office. Or he is just not wanting to check his blood sugars at home.  - Hemoglobin A1c - CBC with Differential/Platelet - Basic metabolic panel - Vitamin 123456 - Folate  3. Controlled type 2 diabetes mellitus with complication, without long-term current use of insulin ([facility]) - - Hemoglobin 123456 - Basic metabolic panel

## 2015-06-06 NOTE — Progress Notes (Signed)
Pre visit review using our clinic review tool, if applicable. No additional management support is needed unless otherwise documented below in the visit note. 

## 2015-06-07 ENCOUNTER — Other Ambulatory Visit: Payer: Self-pay | Admitting: Family Medicine

## 2015-06-11 ENCOUNTER — Other Ambulatory Visit: Payer: Self-pay | Admitting: Family Medicine

## 2015-06-13 ENCOUNTER — Ambulatory Visit (INDEPENDENT_AMBULATORY_CARE_PROVIDER_SITE_OTHER): Payer: Medicare Other | Admitting: Adult Health

## 2015-06-13 DIAGNOSIS — E538 Deficiency of other specified B group vitamins: Secondary | ICD-10-CM

## 2015-06-13 MED ORDER — CYANOCOBALAMIN 1000 MCG/ML IJ SOLN
1000.0000 ug | Freq: Once | INTRAMUSCULAR | Status: AC
  Administered 2015-06-13: 1000 ug via INTRAMUSCULAR

## 2015-06-14 NOTE — Progress Notes (Signed)
Drew Burke brought in his glucometer. Drew Burke went over the instructions on how to use the glucometer with [name] and he was able to demonstrate how to properly use it.

## 2015-07-16 ENCOUNTER — Ambulatory Visit: Payer: Medicare Other

## 2015-07-27 ENCOUNTER — Other Ambulatory Visit: Payer: Self-pay | Admitting: Adult Health

## 2015-07-27 NOTE — Telephone Encounter (Signed)
Pt last visit 06/13/15 Pt last Rx refill 05/24/15 #90

## 2015-07-27 NOTE — Telephone Encounter (Signed)
Ok to refill for one month  

## 2015-08-13 ENCOUNTER — Ambulatory Visit (INDEPENDENT_AMBULATORY_CARE_PROVIDER_SITE_OTHER)
Admission: RE | Admit: 2015-08-13 | Discharge: 2015-08-13 | Disposition: A | Discharge status: Home / Self Care | Payer: Medicare Other | Source: Ambulatory Visit | Attending: Adult Health | Admitting: Adult Health

## 2015-08-13 ENCOUNTER — Encounter: Payer: Self-pay | Admitting: Adult Health

## 2015-08-13 ENCOUNTER — Ambulatory Visit (INDEPENDENT_AMBULATORY_CARE_PROVIDER_SITE_OTHER): Payer: Medicare Other | Admitting: Adult Health

## 2015-08-13 ENCOUNTER — Other Ambulatory Visit: Payer: Self-pay | Admitting: Adult Health

## 2015-08-13 VITALS — BP 128/64 | Temp 98.1°F | Wt 223.2 lb

## 2015-08-13 DIAGNOSIS — E538 Deficiency of other specified B group vitamins: Secondary | ICD-10-CM

## 2015-08-13 DIAGNOSIS — W19XXXA Unspecified fall, initial encounter: Secondary | ICD-10-CM

## 2015-08-13 DIAGNOSIS — R42 Dizziness and giddiness: Secondary | ICD-10-CM | POA: Diagnosis not present

## 2015-08-13 LAB — CBC WITH DIFFERENTIAL/PLATELET
BASOS PCT: 0.3 % (ref 0.0–3.0)
Basophils Absolute: 0 10*3/uL (ref 0.0–0.1)
EOS ABS: 0.2 10*3/uL (ref 0.0–0.7)
Eosinophils Relative: 1.6 % (ref 0.0–5.0)
HCT: 44.4 % (ref 39.0–52.0)
Hemoglobin: 14.9 g/dL (ref 13.0–17.0)
LYMPHS PCT: 50.2 % — AB (ref 12.0–46.0)
Lymphs Abs: 5.3 10*3/uL — ABNORMAL HIGH (ref 0.7–4.0)
MCHC: 33.5 g/dL (ref 30.0–36.0)
MCV: 100 fl (ref 78.0–100.0)
MONO ABS: 0.8 10*3/uL (ref 0.1–1.0)
MONOS PCT: 7.6 % (ref 3.0–12.0)
NEUTROS ABS: 4.2 10*3/uL (ref 1.4–7.7)
Neutrophils Relative %: 40.3 % — ABNORMAL LOW (ref 43.0–77.0)
PLATELETS: 160 10*3/uL (ref 150.0–400.0)
RBC: 4.44 Mil/uL (ref 4.22–5.81)
RDW: 14.1 % (ref 11.5–15.5)
WBC: 10.5 10*3/uL (ref 4.0–10.5)

## 2015-08-13 LAB — POCT URINALYSIS DIPSTICK
Glucose, UA: NEGATIVE
Leukocytes, UA: NEGATIVE
Nitrite, UA: NEGATIVE
SPEC GRAV UA: 1.015
UROBILINOGEN UA: 0.2
pH, UA: 5.5

## 2015-08-13 LAB — BASIC METABOLIC PANEL
BUN: 17 mg/dL (ref 6–23)
CALCIUM: 10 mg/dL (ref 8.4–10.5)
CO2: 35 meq/L — AB (ref 19–32)
CREATININE: 1.15 mg/dL (ref 0.40–1.50)
Chloride: 98 mEq/L (ref 96–112)
GFR: 66.97 mL/min (ref >60.00)
Glucose, Bld: 101 mg/dL — ABNORMAL HIGH (ref 70–99)
Potassium: 3.1 mEq/L — ABNORMAL LOW (ref 3.5–5.1)
SODIUM: 145 meq/L (ref 135–145)

## 2015-08-13 MED ORDER — CYANOCOBALAMIN 1000 MCG/ML IJ SOLN
1000.0000 ug | Freq: Once | INTRAMUSCULAR | Status: AC
  Administered 2015-08-13: 1000 ug via INTRAMUSCULAR

## 2015-08-13 NOTE — Addendum Note (Signed)
Addended by: Apolinar Junes on: 08/13/2015 01:16 PM   Modules accepted: Orders

## 2015-08-13 NOTE — Patient Instructions (Signed)
I will follow up with you regarding your lab work x ray  Someone will call you to schedule your physical therapy as well as your holter monitor.   Stay well hydrated   Get some over the counter vitamin B12 tablets at the pharmacy.   Follow up as needed

## 2015-08-13 NOTE — Progress Notes (Signed)
Subjective:    Patient ID: Drew Burke, male    DOB: 21-Mar-1946, 70 y.o.   MRN: [id number]  HPI  70 year old male who presents to the office today for falls he sustained last week. His first fall was on Thursday when he was sitting in his dining room table, when he got up out of the seat he became dizzy and fell causing scrapes to his knees and elbows. He reports that he was to weak to get up off the floor and had to wait until the plumber came to be helped up.   On Saturday when he got up out of the recliner he got dizzy and fell backwards onto his tailbone and hit his head on the carpet.   He reports that he has stopped drinking and his last drink was three weeks ago. He quit cold [place]. Feels like he is shaking more than normal. He is unable to write checks.    Denies LOC, blurred vision, headaches or ringing in his ears.   Review of Systems  Constitutional: Positive for activity change.  Respiratory: Negative.   Cardiovascular: Negative.   Musculoskeletal: Positive for back pain, arthralgias and gait problem. Negative for joint swelling, neck pain and neck stiffness.  Skin: Negative.   Neurological: Positive for dizziness, tremors, weakness and light-headedness. Negative for seizures, syncope and numbness.  Psychiatric/Behavioral: Positive for decreased concentration.  All other systems reviewed and are negative.  Past Medical History  Diagnosis Date  . Allergic rhinitis   . Hyperlipidemia   . Hypertension   . Tobacco abuse   . Anxiety   . Depression   . Aneurysm ([place])   . DM type 2 (diabetes mellitus, type 2) ([place])     Social History   Social History  . Marital Status: Divorced    Spouse Name: N/A  . Number of Children: N/A  . Years of Education: N/A   Occupational History  . sales    Social History Main Topics  . Smoking status: Current Every Day Smoker -- 1.00 packs/day for 54 years    Types: Cigarettes    Start date: 07/07/1960  . Smokeless tobacco:  Never Used     Comment: used to smoke 3 ppd - currently smoking 1 ppd - started smoking at age 23.  Marland Kitchen Alcohol Use: 16.8 oz/week    28 Shots of liquor per week     Comment: Patient thinks he self medicates with alcohol for back pain. Does not drink if taking hydrocodone  . Drug Use: No  . Sexual Activity: Not on file   Other Topics Concern  . Not on file   Social History Narrative   Retired - Diplomatic Services operational officer by himself   Has a dog       Past Surgical History  Procedure Laterality Date  . Lumbar disc surgery    . Corneal transplant surgery left eye    . Lumbar laminectomy      Family History  Problem Relation Age of Onset  . Depression Other   . Hypertension Other   . Heart disease Father   . Alcohol abuse Father   . Cancer Mother     intestines    No Known Allergies  Current Outpatient Prescriptions on File Prior to Visit  Medication Sig Dispense Refill  . albuterol (PROAIR HFA) 108 (90 BASE) MCG/ACT inhaler INHALE 2 PUFFS INTO THE LUNGS EVERY 4 (FOUR) HOURS AS NEEDED FOR WHEEZING. 18 g 6  .  aspirin 81 MG tablet Take 81 mg by mouth daily.      . diazepam (VALIUM) 5 MG tablet TAKE 1 TABLET BY MOUTH 3 TIMES A DAY 90 tablet 0  . escitalopram (LEXAPRO) 20 MG tablet Take 1 tablet (20 mg total) by mouth daily. 30 tablet 6  . glucose blood (ONETOUCH VERIO) test strip Check twice daily. 100 each 12  . losartan-hydrochlorothiazide (HYZAAR) 100-25 MG tablet TAKE 1 TABLET BY MOUTH EVERY DAY 100 tablet 1  . losartan-hydrochlorothiazide (HYZAAR) 100-25 MG tablet TAKE 1 TABLET BY MOUTH EVERY DAY 100 tablet 2  . metFORMIN (GLUCOPHAGE) 1000 MG tablet TAKE 1 TABLET BY MOUTH TWICE A DAY 200 tablet 2  . ONETOUCH DELICA LANCETS FINE MISC Test twice daily. 100 each 5  . simvastatin (ZOCOR) 40 MG tablet TAKE 1 TABLET BY MOUTH AT BEDTIME 100 tablet 1  . triamcinolone cream (KENALOG) 0.5 % APPLY TOPICALLY TWICE A DAY 30 g 0   No current facility-administered medications on file prior to  visit.    BP 128/64 mmHg  Temp(Src) 98.1 F (36.7 C) (Oral)  Wt 223 lb 3.2 oz (101.243 kg)       Objective:   Physical Exam  Constitutional: He is oriented to person, place, and time. He appears well-developed and well-nourished. No distress.  HENT:  Head: Normocephalic and atraumatic.  Right Ear: External ear normal.  Left Ear: External ear normal.  Nose: Nose normal.  Mouth/Throat: Oropharynx is clear and moist. No oropharyngeal exudate.  Eyes: Conjunctivae and EOM are normal. Pupils are equal, round, and reactive to light. Right eye exhibits no discharge. Left eye exhibits no discharge. No scleral icterus.  Neck: Normal range of motion. Neck supple. No JVD present. No tracheal deviation present. No thyromegaly present.  Cardiovascular: Normal rate, regular rhythm, normal heart sounds and intact distal pulses.  Exam reveals no gallop and no friction rub.   No murmur heard. Pulmonary/Chest: No stridor.  Musculoskeletal: Normal range of motion. He exhibits no tenderness.  Lymphadenopathy:    He has no cervical adenopathy.  Neurological: He is alert and oriented to person, place, and time.  Skin: Skin is warm and dry. He is not diaphoretic.  Small bruise on right lumbar spine.  Small  Scabs on bilateral elbows and knees.  No trauma to head noticed  Psychiatric: He has a normal mood and affect. His behavior is normal. Judgment and thought content normal.  Nursing note and vitals reviewed.     Assessment & Plan:  1. Dizziness - EKG 12-Lead - Sinus Rhythm - -Old anteroseptal infarct. Rate 62. This EKG is consistent with other EKG's - Basic metabolic panel - CBC with Differential/Platelet - POCT urinalysis dipstick - Ambulatory referral to Physical Therapy - Holter monitor - 48 hour; Future - DG Lumbar Spine Complete; Future - Ethanol  2. Fall, initial encounter - Orthostatic hypotension vs cardiac arrhythmia vs hypovolemia vs vasovagal vs ETOH enduced - EKG [id number] -  Basic metabolic panel - CBC with Differential/Platelet - POCT urinalysis dipstick - Ambulatory referral to Physical Therapy - Holter monitor - 48 hour; Future - DG Lumbar Spine Complete; Future - Ethanol - Consider Echo and follow up with cardiology 3. Vitamin B12 deficiency - cyanocobalamin ((VITAMIN B-12)) injection 1,000 mcg; Inject 1 mL (1,000 mcg total) into the muscle once.

## 2015-08-15 ENCOUNTER — Telehealth: Payer: Self-pay | Admitting: Adult Health

## 2015-08-15 LAB — ETHANOL

## 2015-08-15 NOTE — Telephone Encounter (Signed)
Left VM to call back regarding labs and x rays

## 2015-08-16 ENCOUNTER — Telehealth: Payer: Self-pay | Admitting: Adult Health

## 2015-08-16 ENCOUNTER — Other Ambulatory Visit: Payer: Self-pay | Admitting: Adult Health

## 2015-08-16 MED ORDER — POTASSIUM CHLORIDE ER 10 MEQ PO TBCR: 10.0000 meq | EXTENDED_RELEASE_TABLET | Freq: Every day | ORAL | Status: DC

## 2015-08-16 NOTE — Telephone Encounter (Signed)
Pt was seen on 2-6 for dizziness and still waiting on med to be call into [facility]

## 2015-08-17 ENCOUNTER — Ambulatory Visit: Payer: Medicare Other | Attending: Adult Health | Admitting: Physical Therapy

## 2015-08-17 DIAGNOSIS — R29898 Other symptoms and signs involving the musculoskeletal system: Secondary | ICD-10-CM

## 2015-08-17 DIAGNOSIS — M2560 Stiffness of unspecified joint, not elsewhere classified: Secondary | ICD-10-CM | POA: Diagnosis present

## 2015-08-17 DIAGNOSIS — R296 Repeated falls: Secondary | ICD-10-CM | POA: Diagnosis present

## 2015-08-17 DIAGNOSIS — R262 Difficulty in walking, not elsewhere classified: Secondary | ICD-10-CM

## 2015-08-17 NOTE — Therapy (Signed)
[facility] [address] [place], [place], [address] Phone: [phone]   Fax:  [phone]  Physical Therapy Evaluation  Patient Details  Name: Drew Burke MRN: [id number] Date of Birth: [birth year] Referring Provider: Dr. Carlisle Cater  Encounter Date: 08/17/2015      PT End of Session - 08/17/15 1158    Visit Number 1   Number of Visits 10   Date for PT Re-Evaluation 10/12/15   Authorization Type UHC Medicare G codes    PT Start Time 1015   PT Stop Time 1100   PT Time Calculation (min) 45 min   Activity Tolerance Patient tolerated treatment well      Past Medical History  Diagnosis Date  . Allergic rhinitis   . Hyperlipidemia   . Hypertension   . Tobacco abuse   . Anxiety   . Depression   . Aneurysm ([place])   . DM type 2 (diabetes mellitus, type 2) ([place])     Past Surgical History  Procedure Laterality Date  . Lumbar disc surgery    . Corneal transplant surgery left eye    . Lumbar laminectomy      There were no vitals filed for this visit.  Visit Diagnosis:  Falls frequently - Plan: PT plan of care cert/re-cert  Weakness of both lower extremities - Plan: PT plan of care cert/re-cert  Difficulty in walking - Plan: PT plan of care cert/re-cert  Joint stiffness of multiple sites - Plan: PT plan of care cert/re-cert      Subjective Assessment - 08/17/15 1019    Subjective complains of low energy;  decreased balance;  use to play golf;  falls 2x (last weekend fell in dining room and couldn't get off floor, plumber had to help him up).  mainly balance vs. dizziness;  last year fell backwards hitting head;  sprained right ankle, tailbone pain from recent fall   Pertinent History herniated disc lumbar 15 years ago laminectomy    Limitations Walking   How long can you walk comfortably? 200 yards   Diagnostic tests bloodwork OK except liver panel;  no scans of head   Patient Stated Goals improved  balance;  increased energy   Currently in Pain? Yes   Pain Location --  tailbone bruised   Aggravating Factors  movement            [facility] PT Assessment - 08/17/15 0001    Assessment   Medical Diagnosis dizziness; fall   Referring Provider Dr. Carlisle Cater   Onset Date/Surgical Date --  1 year   Hand Dominance Right   Next MD Visit 2 weeks   Prior Therapy no   Precautions   Precautions None   Restrictions   Weight Bearing Restrictions No   Balance Screen   Has the patient fallen in the past 6 months Yes   How many times? 3-4   Has the patient had a decrease in activity level because of a fear of falling?  Yes   Is the patient reluctant to leave their home because of a fear of falling?  Yes   [place] residence   Living Arrangements Alone   Type of [place] to enter   Entrance Stairs-Number of Steps 2   [place] One level   Prior Function   Level of Independence Independent with basic ADLs   Vocation Retired   Leisure take care of Lucien; stopped  golf    ROM / Strength   AROM / PROM / Strength AROM;Strength   AROM   Overall AROM Comments Decreased lumbar flexion to 50 degrees, extension 10x;  decreased ankle DF to neutral;  shoulder and cervical AROM WFLS   Strength   Strength Assessment Site Hip;Knee;Ankle   Right/Left Hip --  grossly 4/5 B   Right/Left Knee --  grossly 4/5 B   Right/Left Ankle --  B ankle DF 4/5, PF 4/5, Eversion, Inversion   Balance   Balance Assessed Yes   Standardized Balance Assessment   Standardized Balance Assessment Berg Balance Test;Dynamic Gait Index;Timed Up and Go Test   Berg Balance Test   Sit to Stand Able to stand  independently using hands   Standing Unsupported Able to stand safely 2 minutes   Sitting with Back Unsupported but Feet Supported on Floor or Stool Able to sit safely and securely 2 minutes   Stand to Sit Sits safely with minimal use of hands   Transfers  Able to transfer safely, minor use of hands   Standing Unsupported with Eyes Closed Able to stand 10 seconds safely   Standing Ubsupported with Feet Together Able to place feet together independently and stand for 1 minute with supervision   From Standing, Reach Forward with Outstretched Arm Can reach forward >12 cm safely (5")   From Standing Position, Pick up Object from Floor Able to pick up shoe safely and easily   From Standing Position, Turn to Look Behind Over each Shoulder Looks behind from both sides and weight shifts well   Turn 360 Degrees Able to turn 360 degrees safely in 4 seconds or less   Standing Unsupported, Alternately Place Feet on Step/Stool Able to stand independently and complete 8 steps >20 seconds   Standing Unsupported, One Foot in Front Able to plae foot ahead of the other independently and hold 30 seconds   Standing on One Leg Tries to lift leg/unable to hold 3 seconds but remains standing independently   Total Score 48                             PT Short Term Goals - 08/17/15 1214    PT SHORT TERM GOAL #1   Title The patient will be have knowledge of basic strengthening and flexibility HEP   09/14/15   Time 4   Period Weeks   Status New   PT SHORT TERM GOAL #2   Title The patient will have LE strength to 4+/5 needed to rise from a standard chair without UE assist.     Time 4   Period Weeks   Status New   PT SHORT TERM GOAL #3   Title The patient will be able to walk 1000 feet in 6 minutes needed for community ambulation    Time 4   Period Weeks   Status New   PT SHORT TERM GOAL #4   Title BERG balance test improved to 50/56 needed for decreased risk of falls    Time 4   Period Weeks   Status New           PT Long Term Goals - 08/17/15 1222    PT LONG TERM GOAL #1   Title The patient will be independent with home and gym program for strengthening and balance to decrease risk of falls at home and in the community  10/12/15    Time 8  Period Weeks   Status New   PT LONG TERM GOAL #2   Title The patient will have improved LE strength to 5-/5 grossly needed to get up off the floor   Time 8   Period Weeks   Status New   PT LONG TERM GOAL #3   Title The patient will be able to walk 15 minutes needed for grocery shopping and socializing in the community   Time 8   Period Weeks   Status New   PT LONG TERM GOAL #4   Title BERG balance test improved to 52/56 indicating min risk of falls   Time 8   Period Weeks   Status New               Plan - 08-18-15 1159    Clinical Impression Statement The patient presents for moderately complex evaluation for progressive worsening of balance, frequent falls, weakness and low energy for 1 year.    He has fallen 4x over the last 6 months.  The most recent was last weekend resulting in back pain and an ankle sprain on right.  He states he has become so weak he was unable to get up off the floor and someone had to help him up.   He has a hx of lumbar HNP with surgery, diabetes and peripheral neuropathy.  Gait distance limited to approx 200 yards.  Decreased lumbar flexion and extension.  Decreased HS length B.  Decreased ankle DF.  LE strength grossly 4/5.  BERG balance score 48/56 indicating moderate risk of falls.  Patient would benefit from  PT to address these deficits.     Pt will benefit from skilled therapeutic intervention in order to improve on the following deficits Decreased balance;Decreased activity tolerance;Decreased strength;Difficulty walking;Pain;Decreased coordination   Rehab Potential Good   PT Frequency 2x / week   PT Duration 8 weeks   PT Treatment/Interventions ADLs/Self Care Home Management;Therapeutic exercise;Therapeutic activities;Patient/family education;Neuromuscular re-education;Cryotherapy;Electrical Stimulation;Moist Heat;Ultrasound   PT Next Visit Plan Check Dynamic Gait Index, possible 6 min walk test; Nu-Step; balance exercises in standing;   ankle DF strengthening;  HS and gastroc stretch          G-Codes - August 18, 2015 1210    Functional Assessment Tool Used Clinical judgement: BERG   Functional Limitation Mobility: Walking and moving around   Mobility: Walking and Moving Around Current Status [id number]) At least 40 percent but less than 60 percent impaired, limited or restricted   Mobility: Walking and Moving Around Goal Status [phone]) At least 20 percent but less than 40 percent impaired, limited or restricted       Problem List Patient Active Problem List   Diagnosis Date Noted  . Coronary artery disease due to calcified coronary lesion 06/15/2014  . COPD with emphysema ([place]) 06/15/2014  . Cancer screening 06/08/2014  . Back pain, chronic 04/06/2013  . Diabetes type 2, controlled ([place]) 08/05/2011  . 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 Anxiety state 01/19/2008  . TOBACCO ABUSE 01/19/2008  . Depression with anxiety 01/19/2008  . Hyperlipidemia, group A 12/09/2007  . GENERALIZED ANXIETY DISORDER 12/09/2007  . Essential hypertension 12/09/2007  . Allergic rhinitis 12/09/2007    Alvera Singh 08-18-2015, 12:30 PM  [facility] [address], [place], [place], [address] Phone: [phone]   Fax:  [phone]  Name: ALPHA GEERS MRN: [id number] Date of Birth: 1945/08/19   Ruben Im, PT August 18, 2015 12:31 PM Phone:  [phone] Fax: [phone]

## 2015-08-22 ENCOUNTER — Ambulatory Visit (INDEPENDENT_AMBULATORY_CARE_PROVIDER_SITE_OTHER): Payer: Medicare Other

## 2015-08-22 DIAGNOSIS — R42 Dizziness and giddiness: Secondary | ICD-10-CM | POA: Diagnosis not present

## 2015-08-22 DIAGNOSIS — W19XXXA Unspecified fall, initial encounter: Secondary | ICD-10-CM

## 2015-08-28 ENCOUNTER — Ambulatory Visit: Payer: Medicare Other | Admitting: Physical Therapy

## 2015-08-28 ENCOUNTER — Encounter: Payer: Self-pay | Admitting: Physical Therapy

## 2015-08-28 DIAGNOSIS — M2560 Stiffness of unspecified joint, not elsewhere classified: Secondary | ICD-10-CM

## 2015-08-28 DIAGNOSIS — R296 Repeated falls: Secondary | ICD-10-CM | POA: Diagnosis not present

## 2015-08-28 DIAGNOSIS — R29898 Other symptoms and signs involving the musculoskeletal system: Secondary | ICD-10-CM

## 2015-08-28 DIAGNOSIS — R262 Difficulty in walking, not elsewhere classified: Secondary | ICD-10-CM

## 2015-08-28 NOTE — Therapy (Signed)
[facility] [name] [place], [place], [address] Phone: [phone]   Fax:  [phone]  Physical Therapy Treatment  Patient Details  Name: Drew Burke MRN: [id number] Date of Birth: [birth year] Referring Provider: Dr. Carlisle Cater  Encounter Date: 08/28/2015      PT End of Session - 08/28/15 1240    Visit Number 2   Number of Visits 10   Date for PT Re-Evaluation 10/12/15   Authorization Type UHC Medicare G codes    PT Start Time 22-Oct-1218   PT Stop Time 10/21/1305   PT Time Calculation (min) 47 min   Activity Tolerance Patient tolerated treatment well   Behavior During Therapy [facility] for tasks assessed/performed      Past Medical History  Diagnosis Date  . Allergic rhinitis   . Hyperlipidemia   . Hypertension   . Tobacco abuse   . Anxiety   . Depression   . Aneurysm ([place])   . DM type 2 (diabetes mellitus, type 2) ([place])     Past Surgical History  Procedure Laterality Date  . Lumbar disc surgery    . Corneal transplant surgery left eye    . Lumbar laminectomy      There were no vitals filed for this visit.  Visit Diagnosis:  Falls frequently  Weakness of both lower extremities  Difficulty in walking  Joint stiffness of multiple sites      Subjective Assessment - 08/28/15 1238    Subjective complains of decreased balance and recent falls (x2 in last week) low energy;  unable to play golf due to balanceup).  mainly balance vs. dizziness;  last year fell backwards hitting head;  sprained right ankle, tailbone pain from recent fall   Pertinent History herniated disc lumbar 15 years ago laminectomy, low energy, sprained Rt ankle due to fall, tailbone pain from recent fall.      Limitations Walking   How long can you walk comfortably? 200 yards   Diagnostic tests bloodwork OK except liver panel;  no scans of head   Patient Stated Goals improved balance;  increased energy   Currently in Pain? No/denies   Multiple Pain Sites No                         [facility] Adult PT Treatment/Exercise - 08/28/15 0001    Exercises   Exercises Knee/Hip   Knee/Hip Exercises: Stretches   Active Hamstring Stretch Both;3 reps;20 seconds  at stairs   Knee/Hip Exercises: Aerobic   Nustep S#11, arms 10  x 25min L1   Knee/Hip Exercises: Machines for Strengthening   Cybex Leg Press St#9, 90# B LE 3x10, 45# Lt/Rt 3x10   Knee/Hip Exercises: Standing   Forward Step Up 2 sets;10 reps;Hand Hold: 2;Step Height: 6"  2nd set alternating, challenging for pt   Walking with Sports Cord 25# forward/reverse, backward/reverse x10 with min A for safety                  PT Short Term Goals - 08/28/15 1251    PT SHORT TERM GOAL #1   Title The patient will be have knowledge of basic strengthening and flexibility HEP   09/14/15   Time 4   Period Weeks   Status On-going   PT SHORT TERM GOAL #2   Title The patient will have LE strength to 4+/5 needed to rise from a standard chair without UE assist.     Time  4   Period Weeks   Status On-going   PT SHORT TERM GOAL #3   Title The patient will be able to walk 1000 feet in 6 minutes needed for community ambulation    Time 4   Period Weeks   Status On-going   PT SHORT TERM GOAL #4   Title BERG balance test improved to 50/56 needed for decreased risk of falls    Time 4   Period Weeks   Status On-going           PT Long Term Goals - 08/28/15 1252    PT LONG TERM GOAL #1   Title The patient will be independent with home and gym program for strengthening and balance to decrease risk of falls at home and in the community  10/12/15   Time 8   Period Weeks   Status On-going   PT LONG TERM GOAL #2   Title The patient will have improved LE strength to 5-/5 grossly needed to get up off the floor   Time 8   Period Weeks   Status On-going   PT LONG TERM GOAL #3   Title The patient will be able to walk 15 minutes needed for grocery shopping and  socializing in the community   Time 8   Period Weeks   Status On-going   PT LONG TERM GOAL #4   Title BERG balance test improved to 52/56 indicating min risk of falls   Time 8   Period Weeks   Status On-going               Plan - 08/28/15 1242    Clinical Impression Statement Pt with limited balance, frequent falls, weakness and low energy. Pt is challenged with alternating stepping up due to decreased coordination. Pt was able to perform resisted walking with 25#. Pt will continue to benefit from skilled PT for flexibility, strength, and improve balance.    Pt will benefit from skilled therapeutic intervention in order to improve on the following deficits Decreased balance;Decreased activity tolerance;Decreased strength;Difficulty walking;Pain;Decreased coordination   Rehab Potential Good   Clinical Impairments Affecting Rehab Potential Pt with low energy, decreased balance vs dizziness, 70y.o laminectomy.    PT Frequency 2x / week   PT Duration 8 weeks   PT Treatment/Interventions ADLs/Self Care Home Management;Therapeutic exercise;Therapeutic activities;Patient/family education;Neuromuscular re-education;Cryotherapy;Electrical Stimulation;Moist Heat;Ultrasound   PT Next Visit Plan Check Dynamic Gait Index, possible 6 min walk test; Nu-Step; balance exercises in standing;  ankle DF strengthening;  HS and gastroc stretch   Consulted and Agree with Plan of Care Patient        Problem List Patient Active Problem List   Diagnosis Date Noted  . Coronary artery disease due to calcified coronary lesion 06/15/2014  . COPD with emphysema ([place]) 06/15/2014  . Cancer screening 06/08/2014  . Back pain, chronic 04/06/2013  . Diabetes type 2, controlled ([place]) 08/05/2011  . 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 Anxiety state 01/19/2008  . TOBACCO ABUSE 01/19/2008  . Depression with anxiety 01/19/2008  . Hyperlipidemia, group A 12/09/2007  .  GENERALIZED ANXIETY DISORDER 12/09/2007  . Essential hypertension 12/09/2007  . Allergic rhinitis 12/09/2007    NAUMANN-HOUEGNIFIO,Blaiden Werth PTA 08/28/2015, 1:15 PM  [facility] [address], [place], [place], [address] Phone: [phone]   Fax:  [phone]  Name: EARNESTINE ZUKER MRN: [id number] Date of Birth: [birth year]

## 2015-08-30 ENCOUNTER — Ambulatory Visit (INDEPENDENT_AMBULATORY_CARE_PROVIDER_SITE_OTHER): Payer: Medicare Other | Admitting: Adult Health

## 2015-08-30 DIAGNOSIS — E876 Hypokalemia: Secondary | ICD-10-CM | POA: Diagnosis not present

## 2015-08-30 DIAGNOSIS — Z09 Encounter for follow-up examination after completed treatment for conditions other than malignant neoplasm: Secondary | ICD-10-CM

## 2015-08-30 DIAGNOSIS — E538 Deficiency of other specified B group vitamins: Secondary | ICD-10-CM | POA: Diagnosis not present

## 2015-08-30 LAB — BASIC METABOLIC PANEL
BUN: 17 mg/dL (ref 6–23)
CALCIUM: 10 mg/dL (ref 8.4–10.5)
CO2: 34 meq/L — AB (ref 19–32)
Chloride: 101 mEq/L (ref 96–112)
Creatinine, Ser: 0.98 mg/dL (ref 0.40–1.50)
GFR: 80.53 mL/min (ref >60.00)
GLUCOSE: 95 mg/dL (ref 70–99)
Potassium: 3.8 mEq/L (ref 3.5–5.1)
SODIUM: 142 meq/L (ref 135–145)

## 2015-08-30 LAB — VITAMIN B12: VITAMIN B 12: 359 pg/mL (ref 211–911)

## 2015-08-30 NOTE — Progress Notes (Signed)
Subjective:    Patient ID: Drew Burke, male    DOB: [birth year], 70 y.o.   MRN: [id number]  HPI  70 year old male, who presents to the office today for two week check up. I last saw him on 08/13/2015 at which time he has sustained multiple falls. His falls did not results in any injuries besides bruising and minor skin abrasions.   He has been seeing a new pain management specialist for his chronic back pain. Drew Burke is happy to say that he woke up this morning and was completely pain free, something that has not happened in years. He reports that " I had some shots in my back".   He also reports that he has worn his halter monitor and would like to have the results.   Overall he is feeling well and has no complaints.   Review of Systems  Constitutional: Negative.   HENT: Negative.   Eyes: Negative.   Respiratory: Negative.   Cardiovascular: Negative.   Gastrointestinal: Negative.   Genitourinary: Negative.   Musculoskeletal: Negative.   Allergic/Immunologic: Negative.   Neurological: Negative.   Hematological: Negative.   Psychiatric/Behavioral: Negative.   All other systems reviewed and are negative.  Past Medical History  Diagnosis Date  . Allergic rhinitis   . Hyperlipidemia   . Hypertension   . Tobacco abuse   . Anxiety   . Depression   . Aneurysm ([place])   . DM type 2 (diabetes mellitus, type 2) ([place])     Social History   Social History  . Marital Status: Divorced    Spouse Name: N/A  . Number of Children: N/A  . Years of Education: N/A   Occupational History  . sales    Social History Main Topics  . Smoking status: Current Every Day Smoker -- 1.00 packs/day for 54 years    Types: Cigarettes    Start date: 07/07/1960  . Smokeless tobacco: Never Used     Comment: used to smoke 3 ppd - currently smoking 1 ppd - started smoking at age 56.  Marland Kitchen Alcohol Use: 16.8 oz/week    28 Shots of liquor per week     Comment: Patient thinks he self medicates with  alcohol for back pain. Does not drink if taking hydrocodone  . Drug Use: No  . Sexual Activity: Not on file   Other Topics Concern  . Not on file   Social History Narrative   Retired - Diplomatic Services operational officer by himself   Has a dog       Past Surgical History  Procedure Laterality Date  . Lumbar disc surgery    . Corneal transplant surgery left eye    . Lumbar laminectomy      Family History  Problem Relation Age of Onset  . Depression Other   . Hypertension Other   . Heart disease Father   . Alcohol abuse Father   . Cancer Mother     intestines    No Known Allergies  Current Outpatient Prescriptions on File Prior to Visit  Medication Sig Dispense Refill  . albuterol (PROAIR HFA) 108 (90 BASE) MCG/ACT inhaler INHALE 2 PUFFS INTO THE LUNGS EVERY 4 (FOUR) HOURS AS NEEDED FOR WHEEZING. 18 g 6  . aspirin 81 MG tablet Take 81 mg by mouth daily.      . diazepam (VALIUM) 5 MG tablet TAKE 1 TABLET BY MOUTH 3 TIMES A DAY 90 tablet 0  . escitalopram (LEXAPRO) 20  MG tablet Take 1 tablet (20 mg total) by mouth daily. 30 tablet 6  . glucose blood (ONETOUCH VERIO) test strip Check twice daily. 100 each 12  . losartan-hydrochlorothiazide (HYZAAR) 100-25 MG tablet TAKE 1 TABLET BY MOUTH EVERY DAY 100 tablet 1  . losartan-hydrochlorothiazide (HYZAAR) 100-25 MG tablet TAKE 1 TABLET BY MOUTH EVERY DAY 100 tablet 2  . metFORMIN (GLUCOPHAGE) 1000 MG tablet TAKE 1 TABLET BY MOUTH TWICE A DAY 200 tablet 2  . ONETOUCH DELICA LANCETS FINE MISC Test twice daily. 100 each 5  . potassium chloride (K-DUR) 10 MEQ tablet Take 1 tablet (10 mEq total) by mouth daily. 30 tablet 6  . simvastatin (ZOCOR) 40 MG tablet TAKE 1 TABLET BY MOUTH AT BEDTIME 100 tablet 1  . triamcinolone cream (KENALOG) 0.5 % APPLY TOPICALLY TWICE A DAY 30 g 0   No current facility-administered medications on file prior to visit.    There were no vitals taken for this visit.       Objective:   Physical Exam  Constitutional: He  is oriented to person, place, and time. He appears well-developed and well-nourished. No distress.  Cardiovascular: Normal rate, regular rhythm, normal heart sounds and intact distal pulses.  Exam reveals no gallop and no friction rub.   No murmur heard. Pulmonary/Chest: Effort normal and breath sounds normal. No respiratory distress. He has no wheezes. He has no rales. He exhibits no tenderness.  Neurological: He is alert and oriented to person, place, and time.  Skin: Skin is warm and dry. No rash noted. He is not diaphoretic. No erythema. No pallor.  Psychiatric: He has a normal mood and affect. His behavior is normal. Judgment and thought content normal.  Nursing note and vitals reviewed.     Assessment & Plan:  1. B12 deficiency - Vitamin B12  2. Hypokalemia - Basic metabolic panel - started him on KDUR 10 meg during last visit.  3. Follow up - He looks good today. The last time I saw him, he was pale and looked exhausted. I am hoping that getting a handle on his pain will allow him to enjoy life once a again.  - Reviewed Holter monitor results with patient.

## 2015-08-30 NOTE — Patient Instructions (Signed)
It was great seeing you again!  You look great and I am so happy you are feeling better.   I will follow up with you about your labs.   Please come back and see me in 3 months for a follow up.

## 2015-08-31 ENCOUNTER — Encounter: Payer: Self-pay | Admitting: Physical Therapy

## 2015-08-31 ENCOUNTER — Telehealth: Payer: Self-pay | Admitting: Adult Health

## 2015-08-31 ENCOUNTER — Ambulatory Visit: Payer: Medicare Other | Admitting: Physical Therapy

## 2015-08-31 DIAGNOSIS — R262 Difficulty in walking, not elsewhere classified: Secondary | ICD-10-CM

## 2015-08-31 DIAGNOSIS — R296 Repeated falls: Secondary | ICD-10-CM | POA: Diagnosis not present

## 2015-08-31 DIAGNOSIS — R29898 Other symptoms and signs involving the musculoskeletal system: Secondary | ICD-10-CM

## 2015-08-31 DIAGNOSIS — M2560 Stiffness of unspecified joint, not elsewhere classified: Secondary | ICD-10-CM

## 2015-08-31 NOTE — Telephone Encounter (Signed)
Attempted to call patient about his labs. Mailbox is full. Will try and leave a message at another time

## 2015-08-31 NOTE — Telephone Encounter (Signed)
Called patient and reviewed labs with him.

## 2015-08-31 NOTE — Therapy (Signed)
[facility] [address] [place], [place], [address] Phone: [phone]   Fax:  [phone]  Physical Therapy Treatment  Patient Details  Name: Drew Burke MRN: [id number] Date of Birth: 12-10-1945 Referring Provider: Dr. Carlisle Cater  Encounter Date: 08/31/2015      PT End of Session - 08/31/15 1033    Visit Number 3   Number of Visits 10   Date for PT Re-Evaluation 10/12/15   Authorization Type UHC Medicare G codes    PT Start Time 1010   PT Stop Time 1055   PT Time Calculation (min) 45 min   Activity Tolerance Patient tolerated treatment well   Behavior During Therapy [facility] for tasks assessed/performed      Past Medical History  Diagnosis Date  . Allergic rhinitis   . Hyperlipidemia   . Hypertension   . Tobacco abuse   . Anxiety   . Depression   . Aneurysm ([place])   . DM type 2 (diabetes mellitus, type 2) ([place])     Past Surgical History  Procedure Laterality Date  . Lumbar disc surgery    . Corneal transplant surgery left eye    . Lumbar laminectomy      There were no vitals filed for this visit.  Visit Diagnosis:  Falls frequently  Weakness of both lower extremities  Difficulty in walking  Joint stiffness of multiple sites      Subjective Assessment - 08/31/15 1023    Subjective Pt reports since he recieved the shot in his back he has no complain of pain. Pt reports noticed improvement with his balance with walking at home. Mainly balance vs dizziness.    Pertinent History herniated disc lumbar 15 years ago laminectomy, low energy, sprained Rt ankle due to fall, tailbone pain from recent fall.      Limitations Walking   How long can you walk comfortably? 200 yards   Diagnostic tests bloodwork OK except liver panel;  no scans of head   Patient Stated Goals improved balance;  increased energy   Currently in Pain? No/denies   Multiple Pain Sites No                         [facility] Adult PT Treatment/Exercise - 08/31/15 0001    Ambulation/Gait   Stairs Yes   Stairs Assistance 6: Modified independent (Device/Increase time)   Stair Management Technique Step to pattern  but inconsistent - alternating pattern at times    Number of Stairs 20   Height of Stairs 6   Standardized Balance Assessment   Standardized Balance Assessment Dynamic Gait Index   Dynamic Gait Index   Level Surface Mild Impairment   Change in Gait Speed Mild Impairment   Gait with Horizontal Head Turns Mild Impairment   Gait with Vertical Head Turns Mild Impairment   Gait and Pivot Turn Mild Impairment   Step Over Obstacle Mild Impairment   Step Around Obstacles Mild Impairment   Steps Mild Impairment   Total Score 16   Exercises   Exercises Knee/Hip   Knee/Hip Exercises: Stretches   Active Hamstring Stretch Both;3 reps;20 seconds  on stairs   Knee/Hip Exercises: Aerobic   Stationary Bike L 1 x 73min   Nustep S#11, arms #10  x 58min L1  increase resistance next visit   Knee/Hip Exercises: Machines for Strengthening   Cybex Leg Press St#9, 90# B LE 3x10, 45# Lt/Rt 3x10   Knee/Hip Exercises:  Standing   Forward Step Up 2 sets;10 reps;Hand Hold: 1;Step Height: 6"   Rebounder 3 directions x 1 min each, with B UE support  pt need mod vc's for technique   Walking with Sports Cord --                  PT Short Term Goals - 08/28/15 1251    PT SHORT TERM GOAL #1   Title The patient will be have knowledge of basic strengthening and flexibility HEP   09/14/15   Time 4   Period Weeks   Status On-going   PT SHORT TERM GOAL #2   Title The patient will have LE strength to 4+/5 needed to rise from a standard chair without UE assist.     Time 4   Period Weeks   Status On-going   PT SHORT TERM GOAL #3   Title The patient will be able to walk 1000 feet in 6 minutes needed for community ambulation    Time 4   Period Weeks   Status On-going   PT SHORT TERM GOAL #4   Title BERG  balance test improved to 50/56 needed for decreased risk of falls    Time 4   Period Weeks   Status On-going           PT Long Term Goals - 08/28/15 1252    PT LONG TERM GOAL #1   Title The patient will be independent with home and gym program for strengthening and balance to decrease risk of falls at home and in the community  10/12/15   Time 8   Period Weeks   Status On-going   PT LONG TERM GOAL #2   Title The patient will have improved LE strength to 5-/5 grossly needed to get up off the floor   Time 8   Period Weeks   Status On-going   PT LONG TERM GOAL #3   Title The patient will be able to walk 15 minutes needed for grocery shopping and socializing in the community   Time 8   Period Weeks   Status On-going   PT LONG TERM GOAL #4   Title BERG balance test improved to 52/56 indicating min risk of falls   Time 8   Period Weeks   Status On-going               Plan - 08/31/15 1034    Clinical Impression Statement Pt with limited balance, had difficulties on the bike continued reciprocal activity on Nu-step. Pt will continue to benefit from skilled Pt to improve flexibility, strength and improve balance.   Pt will benefit from skilled therapeutic intervention in order to improve on the following deficits Decreased balance;Decreased activity tolerance;Decreased strength;Difficulty walking;Pain;Decreased coordination   Rehab Potential Good   Clinical Impairments Affecting Rehab Potential Pt with low energy, decreased balance vs dizziness, 70y.o laminectomy.    PT Frequency 2x / week   PT Duration 8 weeks   PT Treatment/Interventions ADLs/Self Care Home Management;Therapeutic exercise;Therapeutic activities;Patient/family education;Neuromuscular re-education;Cryotherapy;Electrical Stimulation;Moist Heat;Ultrasound   PT Next Visit Plan Possible 6 min walk test; Nu-Step; balance exercises in standing;  ankle DF strengthening;  HS and gastroc stretch   Consulted and Agree  with Plan of Care Patient        Problem List Patient Active Problem List   Diagnosis Date Noted  . Coronary artery disease due to calcified coronary lesion 06/15/2014  . COPD with emphysema ([place]) 06/15/2014  . Cancer  screening 06/08/2014  . Back pain, chronic 04/06/2013  . Diabetes type 2, controlled ([place]) 08/05/2011  . 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 Anxiety state 01/19/2008  . TOBACCO ABUSE 01/19/2008  . Depression with anxiety 01/19/2008  . Hyperlipidemia, group A 12/09/2007  . GENERALIZED ANXIETY DISORDER 12/09/2007  . Essential hypertension 12/09/2007  . Allergic rhinitis 12/09/2007    NAUMANN-HOUEGNIFIO,Brennyn Ortlieb PTA 08/31/2015, 11:00 AM  [facility] [address], [place], [place], [address] Phone: [phone]   Fax:  [phone]  Name: Drew Burke MRN: [id number] Date of Birth: [birth year]

## 2015-09-03 ENCOUNTER — Other Ambulatory Visit: Payer: Self-pay | Admitting: Adult Health

## 2015-09-03 NOTE — Telephone Encounter (Signed)
Ok to refill for one month  

## 2015-09-04 ENCOUNTER — Ambulatory Visit: Payer: Medicare Other | Admitting: Physical Therapy

## 2015-09-04 DIAGNOSIS — R296 Repeated falls: Secondary | ICD-10-CM

## 2015-09-04 DIAGNOSIS — R29898 Other symptoms and signs involving the musculoskeletal system: Secondary | ICD-10-CM

## 2015-09-04 DIAGNOSIS — R262 Difficulty in walking, not elsewhere classified: Secondary | ICD-10-CM

## 2015-09-04 DIAGNOSIS — M2560 Stiffness of unspecified joint, not elsewhere classified: Secondary | ICD-10-CM

## 2015-09-04 NOTE — Therapy (Signed)
[facility] [address], [place], [place], [address] Phone: [phone]   Fax:  [phone]  Physical Therapy Treatment  Patient Details  Name: Drew Burke MRN: [id number] Date of Birth: 07-Jan-1946 Referring Provider: Dr. Carlisle Cater  Encounter Date: 09/04/2015      PT End of Session - 09/04/15 1222    Visit Number 4   Number of Visits 10   Date for PT Re-Evaluation 10/12/15   Authorization Type UHC Medicare G codes    PT Start Time 1130   PT Stop Time 1224   PT Time Calculation (min) 54 min   Activity Tolerance Patient tolerated treatment well      Past Medical History  Diagnosis Date  . Allergic rhinitis   . Hyperlipidemia   . Hypertension   . Tobacco abuse   . Anxiety   . Depression   . Aneurysm ([place])   . DM type 2 (diabetes mellitus, type 2) ([place])     Past Surgical History  Procedure Laterality Date  . Lumbar disc surgery    . Corneal transplant surgery left eye    . Lumbar laminectomy      There were no vitals filed for this visit.  Visit Diagnosis:  Falls frequently  Weakness of both lower extremities  Difficulty in walking  Joint stiffness of multiple sites      Subjective Assessment - 09/04/15 1142    Subjective Since injection from the pain clinic, my back feels a lot better.  Waking up without pain.  My balance is better.  I can lie on the floor and get up.  The lab report I got last week was good.     Currently in Pain? No/denies                         [facility] Adult PT Treatment/Exercise - 09/04/15 0001    Lumbar Exercises: Supine   Ab Set 5 reps;5 seconds   Bent Knee Raise 5 reps;5 seconds   Bridge 10 reps   Bridge Limitations with green ball   Isometric Hip Flexion 5 reps;5 seconds   Lumbar Exercises: Quadruped   Single Arm Raise 5 reps   Straight Leg Raise 5 reps   Opposite Arm/Leg Raise Right arm/Left leg;Left arm/Right leg;10 reps   Knee/Hip  Exercises: Stretches   Active Hamstring Stretch Both;3 reps;20 seconds  with strap supine   Knee/Hip Exercises: Aerobic   Stationary Bike L 1 x 84min   Nustep --   Knee/Hip Exercises: Machines for Strengthening   Cybex Leg Press St#9, 90# B LE 3x10, 45# Lt/Rt 3x10   Knee/Hip Exercises: Standing   Heel Raises Both;1 set;10 reps   Other Standing Knee Exercises step taps 3 levels 10x each leg 1 UE on rail   Other Standing Knee Exercises weight shift 4 ways no UE use 2 min and on blue foam 2 min no UE use   Knee/Hip Exercises: Sidelying   Hip ABduction Strengthening;Right;Left;1 set;10 reps   Knee/Hip Exercises: Prone   Hamstring Curl 10 reps   Hamstring Curl Limitations with lumbar multifidi contract   Hip Extension Strengthening;Right;Left;1 set;10 reps                  PT Short Term Goals - 09/04/15 1905    PT SHORT TERM GOAL #1   Title The patient will be have knowledge of basic strengthening and flexibility HEP   09/14/15   Time  4   Period Weeks   Status On-going   PT SHORT TERM GOAL #2   Title The patient will have LE strength to 4+/5 needed to rise from a standard chair without UE assist.     Time 4   Period Weeks   Status On-going   PT SHORT TERM GOAL #3   Title The patient will be able to walk 1000 feet in 6 minutes needed for community ambulation    Time 4   Period Weeks   Status On-going   PT SHORT TERM GOAL #4   Title BERG balance test improved to 50/56 needed for decreased risk of falls    Time 4   Period Weeks   Status On-going           PT Long Term Goals - 09/04/15 1906    PT LONG TERM GOAL #1   Title The patient will be independent with home and gym program for strengthening and balance to decrease risk of falls at home and in the community  10/12/15   Time 8   Period Weeks   Status On-going   PT LONG TERM GOAL #2   Title The patient will have improved LE strength to 5-/5 grossly needed to get up off the floor   Time 8   Period Weeks    Status On-going   PT LONG TERM GOAL #3   Title The patient will be able to walk 15 minutes needed for grocery shopping and socializing in the community   Time 8   Period Weeks   Status On-going   PT LONG TERM GOAL #4   Title BERG balance test improved to 52/56 indicating min risk of falls   Time 8   Period Weeks   Status On-going               Plan - 09/04/15 1222    Clinical Impression Statement Movements are uncoordinated at times (step taps)with difficulty grading pressure and placement.  Wide base of support.  Decreased ankle DF.  No complaints of pain throughout treatment session.  On track to meet STGs in next 1-2 weeks.    Therapist closely monitoring for safety.     PT Next Visit Plan Possible 6 min walk test; Nu-Step; balance exercises in standing;  ankle DF strengthening;  HS and gastroc stretch;  recheck BERG in 1 week        Problem List Patient Active Problem List   Diagnosis Date Noted  . Coronary artery disease due to calcified coronary lesion 06/15/2014  . COPD with emphysema ([place]) 06/15/2014  . Cancer screening 06/08/2014  . Back pain, chronic 04/06/2013  . Diabetes type 2, controlled ([place]) 08/05/2011  . 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 Anxiety state 01/19/2008  . TOBACCO ABUSE 01/19/2008  . Depression with anxiety 01/19/2008  . Hyperlipidemia, group A 12/09/2007  . GENERALIZED ANXIETY DISORDER 12/09/2007  . Essential hypertension 12/09/2007  . Allergic rhinitis 12/09/2007    Alvera Singh 09/04/2015, 7:07 PM  [facility] [address] [place], [place], [address] Phone: [phone]   Fax:  [phone]  Name: Drew Burke MRN: [id number] Date of Birth: 1946/07/07    Ruben Im, PT 09/04/2015 7:08 PM Phone: [phone] Fax: [phone]

## 2015-09-06 ENCOUNTER — Encounter: Payer: Self-pay | Admitting: Physical Therapy

## 2015-09-06 ENCOUNTER — Ambulatory Visit: Payer: Medicare Other | Attending: Adult Health | Admitting: Physical Therapy

## 2015-09-06 DIAGNOSIS — R262 Difficulty in walking, not elsewhere classified: Secondary | ICD-10-CM | POA: Diagnosis present

## 2015-09-06 DIAGNOSIS — R29898 Other symptoms and signs involving the musculoskeletal system: Secondary | ICD-10-CM

## 2015-09-06 DIAGNOSIS — M2560 Stiffness of unspecified joint, not elsewhere classified: Secondary | ICD-10-CM | POA: Diagnosis present

## 2015-09-06 DIAGNOSIS — R296 Repeated falls: Secondary | ICD-10-CM | POA: Diagnosis present

## 2015-09-06 NOTE — Therapy (Signed)
[facility] [address], [place], [place], [address] Phone: [phone]   Fax:  [phone]  Physical Therapy Treatment  Patient Details  Name: Drew Burke MRN: [id number] Date of Birth: May 23, 1946 Referring Provider: Dr. Carlisle Cater  Encounter Date: 09/06/2015      PT End of Session - 09/06/15 1021    Visit Number 5   Number of Visits 10   Date for PT Re-Evaluation 10/12/15   Authorization Type UHC Medicare G codes    PT Start Time 1006/11/02   PT Stop Time 1050   PT Time Calculation (min) 42 min   Activity Tolerance Patient tolerated treatment well   Behavior During Therapy [facility] for tasks assessed/performed      Past Medical History  Diagnosis Date  . Allergic rhinitis   . Hyperlipidemia   . Hypertension   . Tobacco abuse   . Anxiety   . Depression   . Aneurysm ([place])   . DM type 2 (diabetes mellitus, type 2) ([place])     Past Surgical History  Procedure Laterality Date  . Lumbar disc surgery    . Corneal transplant surgery left eye    . Lumbar laminectomy      There were no vitals filed for this visit.  Visit Diagnosis:  Falls frequently  Weakness of both lower extremities  Difficulty in walking  Joint stiffness of multiple sites      Subjective Assessment - 09/06/15 1019    Subjective Pt reports he notices improvement with balance with ambulation.    Pertinent History herniated disc lumbar 15 years ago laminectomy, low energy, sprained Rt ankle due to fall, tailbone pain from recent fall.      Limitations Walking   How long can you walk comfortably? 200 yards   Diagnostic tests bloodwork OK except liver panel;  no scans of head   Patient Stated Goals improved balance;  increased energy   Currently in Pain? No/denies   Multiple Pain Sites No                         [facility] Adult PT Treatment/Exercise - 09/06/15 0001    Ambulation/Gait   Stairs --   Stairs Assistance --   Stair Management Technique --   Number of Stairs --   Height of Stairs --   Exercises   Exercises Knee/Hip   Lumbar Exercises: Supine   Ab Set 5 reps;5 seconds   Bridge 10 reps   Bridge Limitations with green ball  2nd round with green ball   Lumbar Exercises: Quadruped   Single Arm Raise 5 reps   Straight Leg Raise 5 reps   Opposite Arm/Leg Raise Right arm/Left leg;Left arm/Right leg;10 reps   Knee/Hip Exercises: Stretches   Active Hamstring Stretch Both;3 reps;20 seconds  with strap in supine   Knee/Hip Exercises: Aerobic   Stationary Bike L 1 x 64min   Knee/Hip Exercises: Machines for Strengthening   Cybex Leg Press St#9, 90# B LE 3x10, 45# Lt/Rt 3x10  increase the weight    Knee/Hip Exercises: Standing   Heel Raises Both;1 set;10 reps   Other Standing Knee Exercises step taps 10x each leg 1 UE on rail  Sit to stand tanding on blue balance pad x 10, with A to mai   Other Standing Knee Exercises weight shift 4 ways no UE use 2 min and on blue foam 2 min no UE use  SIDE Lunges with weightshift  Lt & Rt challenging, but able t                  PT Short Term Goals - 09/04/15 1905    PT SHORT TERM GOAL #1   Title The patient will be have knowledge of basic strengthening and flexibility HEP   09/14/15   Time 4   Period Weeks   Status On-going   PT SHORT TERM GOAL #2   Title The patient will have LE strength to 4+/5 needed to rise from a standard chair without UE assist.     Time 4   Period Weeks   Status On-going   PT SHORT TERM GOAL #3   Title The patient will be able to walk 1000 feet in 6 minutes needed for community ambulation    Time 4   Period Weeks   Status On-going   PT SHORT TERM GOAL #4   Title BERG balance test improved to 50/56 needed for decreased risk of falls    Time 4   Period Weeks   Status On-going           PT Long Term Goals - 09/04/15 1906    PT LONG TERM GOAL #1   Title The patient will be independent with home and gym program for  strengthening and balance to decrease risk of falls at home and in the community  10/12/15   Time 8   Period Weeks   Status On-going   PT LONG TERM GOAL #2   Title The patient will have improved LE strength to 5-/5 grossly needed to get up off the floor   Time 8   Period Weeks   Status On-going   PT LONG TERM GOAL #3   Title The patient will be able to walk 15 minutes needed for grocery shopping and socializing in the community   Time 8   Period Weeks   Status On-going   PT LONG TERM GOAL #4   Title BERG balance test improved to 52/56 indicating min risk of falls   Time 8   Period Weeks   Status On-going               Plan - 09/06/15 1023    Clinical Impression Statement Movements are uncoordinated as times (step taps) controlling weigth on leg press. Pt is without complain of pain.    Pt will benefit from skilled therapeutic intervention in order to improve on the following deficits Decreased balance;Decreased activity tolerance;Decreased strength;Difficulty walking;Pain;Decreased coordination   Rehab Potential Good   Clinical Impairments Affecting Rehab Potential Pt with low energy, decreased balance vs dizziness, 70y.o laminectomy.    PT Frequency 2x / week   PT Duration 8 weeks   PT Treatment/Interventions ADLs/Self Care Home Management;Therapeutic exercise;Therapeutic activities;Patient/family education;Neuromuscular re-education;Cryotherapy;Electrical Stimulation;Moist Heat;Ultrasound   PT Next Visit Plan Possible 6 min walk test; bike; balance exercises in standing;  ankle DF strengthening;  HS and gastroc stretch;  recheck BERG in 1 week   Consulted and Agree with Plan of Care Patient        Problem List Patient Active Problem List   Diagnosis Date Noted  . Coronary artery disease due to calcified coronary lesion 06/15/2014  . COPD with emphysema ([place]) 06/15/2014  . Cancer screening 06/08/2014  . Back pain, chronic 04/06/2013  . Diabetes type 2, controlled  ([place]) 08/05/2011  . 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 Anxiety state 01/19/2008  . TOBACCO ABUSE 01/19/2008  .  Depression with anxiety 01/19/2008  . Hyperlipidemia, group A 12/09/2007  . GENERALIZED ANXIETY DISORDER 12/09/2007  . Essential hypertension 12/09/2007  . Allergic rhinitis 12/09/2007    NAUMANN-HOUEGNIFIO,Jamyia Fortune PTA 09/06/2015, 10:56 AM  [facility] [address] [place], [place], [address] Phone: [phone]   Fax:  [phone]  Name: Drew Burke MRN: [id number] Date of Birth: 14-Jan-1946

## 2015-09-11 ENCOUNTER — Ambulatory Visit: Payer: Medicare Other | Admitting: Physical Therapy

## 2015-09-11 DIAGNOSIS — R29898 Other symptoms and signs involving the musculoskeletal system: Secondary | ICD-10-CM

## 2015-09-11 DIAGNOSIS — R296 Repeated falls: Secondary | ICD-10-CM

## 2015-09-11 DIAGNOSIS — M2560 Stiffness of unspecified joint, not elsewhere classified: Secondary | ICD-10-CM

## 2015-09-11 DIAGNOSIS — R262 Difficulty in walking, not elsewhere classified: Secondary | ICD-10-CM

## 2015-09-11 NOTE — Therapy (Signed)
[facility] [address] [place], [place], [address] Phone: [phone]   Fax:  [phone]  Physical Therapy Treatment  Patient Details  Name: Drew Burke MRN: [id number] Date of Birth: 06-24-1946 Referring Provider: Dr. Carlisle Cater  Encounter Date: 09/11/2015      PT End of Session - 09/11/15 1051    Visit Number 6   Number of Visits 10   Date for PT Re-Evaluation 10/12/15   Authorization Type UHC Medicare G codes    PT Start Time 1000   PT Stop Time 1056   PT Time Calculation (min) 56 min   Activity Tolerance Patient tolerated treatment well      Past Medical History  Diagnosis Date  . Allergic rhinitis   . Hyperlipidemia   . Hypertension   . Tobacco abuse   . Anxiety   . Depression   . Aneurysm ([place])   . DM type 2 (diabetes mellitus, type 2) ([place])     Past Surgical History  Procedure Laterality Date  . Lumbar disc surgery    . Corneal transplant surgery left eye    . Lumbar laminectomy      There were no vitals filed for this visit.  Visit Diagnosis:  Falls frequently  Weakness of both lower extremities  Difficulty in walking  Joint stiffness of multiple sites      Subjective Assessment - 09/11/15 1020    Subjective Denies pain.  My balance is not good today, I had 2 Scotches yesterday and that affects my balance.     Currently in Pain? No/denies                         [facility] Adult PT Treatment/Exercise - 09/11/15 0001    Lumbar Exercises: Supine   Ab Set 5 reps;5 seconds   Bent Knee Raise 5 reps;5 seconds   Bridge 20 reps   Bridge Limitations no ball    Isometric Hip Flexion 5 reps;5 seconds   Knee/Hip Exercises: Aerobic   Nustep L2 10 min   Knee/Hip Exercises: Machines for Strengthening   Cybex Leg Press St#9, 100# B LE 3x10, 50# Lt/Rt 3x10   Knee/Hip Exercises: Standing   Heel Raises Both   Heel Raises Limitations 2 min wall pull aways   Forward Lunges  Right;Left;1 set;10 reps   Forward Lunges Limitations with UE reach in corner for safety   Other Standing Knee Exercises doorway hip flexor with UE reaches  R/L   Other Standing Knee Exercises B reach and marches against wall   Knee/Hip Exercises: Sidelying   Clams red band 15x R/L                  PT Short Term Goals - 09/11/15 1709    PT SHORT TERM GOAL #1   Title The patient will be have knowledge of basic strengthening and flexibility HEP   09/14/15   Status Achieved   PT SHORT TERM GOAL #2   Title The patient will have LE strength to 4+/5 needed to rise from a standard chair without UE assist.     Time 4   Period Weeks   Status On-going   PT SHORT TERM GOAL #3   Title The patient will be able to walk 1000 feet in 6 minutes needed for community ambulation    Time 4   Period Weeks   Status On-going   PT SHORT TERM GOAL #4  Title BERG balance test improved to 50/56 needed for decreased risk of falls    Time 4   Period Weeks   Status On-going           PT Long Term Goals - 09/11/15 1709    PT LONG TERM GOAL #1   Title The patient will be independent with home and gym program for strengthening and balance to decrease risk of falls at home and in the community  10/12/15   Time 8   Period Weeks   Status On-going   PT LONG TERM GOAL #2   Title The patient will have improved LE strength to 5-/5 grossly needed to get up off the floor   Time 8   Period Weeks   Status On-going   PT LONG TERM GOAL #3   Title The patient will be able to walk 15 minutes needed for grocery shopping and socializing in the community   Time 8   Period Weeks   Status On-going   PT LONG TERM GOAL #4   Title BERG balance test improved to 52/56 indicating min risk of falls   Time 8   Period Weeks   Status On-going               Plan - 09/11/15 1053    Clinical Impression Statement Uncoordinated movements in supine, sitting and standing.  Very close supervision in standing with  dynamic balance exercises with 1 episode of loss of balance but patient was able to self recover.   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  No recent falls in past 5 weeks per patient report.       PT Next Visit Plan Recheck BERG next visit and STGS,  recheck gait speed;  6 min walk;   dynamic balance ex in standing        Problem List Patient Active Problem List   Diagnosis Date Noted  . Coronary artery disease due to calcified coronary lesion 06/15/2014  . COPD with emphysema ([place]) 06/15/2014  . Cancer screening 06/08/2014  . Back pain, chronic 04/06/2013  . Diabetes type 2, controlled ([place]) 08/05/2011  . 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 Anxiety state 01/19/2008  . TOBACCO ABUSE 01/19/2008  . Depression with anxiety 01/19/2008  . Hyperlipidemia, group A 12/09/2007  . GENERALIZED ANXIETY DISORDER 12/09/2007  . Essential hypertension 12/09/2007  . Allergic rhinitis 12/09/2007    Alvera Singh 09/11/2015, 5:10 PM  [facility] [address], [place], [place], [address] Phone: [phone]   Fax:  [phone]  Name: Drew Burke MRN: [id number] Date of Birth: 05/25/1946    Ruben Im, PT 09/11/2015 5:11 PM Phone: [phone] Fax: [phone]

## 2015-09-13 ENCOUNTER — Ambulatory Visit: Payer: Medicare Other | Admitting: Physical Therapy

## 2015-09-14 ENCOUNTER — Other Ambulatory Visit: Payer: Self-pay | Admitting: Anesthesiology

## 2015-09-14 ENCOUNTER — Ambulatory Visit
Admission: RE | Admit: 2015-09-14 | Discharge: 2015-09-14 | Disposition: A | Discharge status: Home / Self Care | Payer: Medicare Other | Source: Ambulatory Visit | Attending: Anesthesiology | Admitting: Anesthesiology

## 2015-09-14 DIAGNOSIS — M545 Low back pain: Secondary | ICD-10-CM

## 2015-09-18 ENCOUNTER — Ambulatory Visit: Payer: Medicare Other | Admitting: Physical Therapy

## 2015-09-18 DIAGNOSIS — M2560 Stiffness of unspecified joint, not elsewhere classified: Secondary | ICD-10-CM

## 2015-09-18 DIAGNOSIS — R262 Difficulty in walking, not elsewhere classified: Secondary | ICD-10-CM

## 2015-09-18 DIAGNOSIS — R296 Repeated falls: Secondary | ICD-10-CM | POA: Diagnosis not present

## 2015-09-18 DIAGNOSIS — R29898 Other symptoms and signs involving the musculoskeletal system: Secondary | ICD-10-CM

## 2015-09-18 NOTE — Therapy (Signed)
[facility] [address], [place], [place], [address] Phone: [phone]   Fax:  [phone]  Physical Therapy Treatment  Patient Details  Name: Drew Burke MRN: [id number] Date of Birth: 04-14-1946 Referring Provider: Dr. Carlisle Cater  Encounter Date: 09/18/2015      PT End of Session - 09/18/15 1229    Visit Number 7   Number of Visits 10   Date for PT Re-Evaluation 10/12/15   Authorization Type UHC Medicare G codes    PT Start Time 1015   PT Stop Time 1100   PT Time Calculation (min) 45 min   Activity Tolerance Patient tolerated treatment well      Past Medical History  Diagnosis Date  . Allergic rhinitis   . Hyperlipidemia   . Hypertension   . Tobacco abuse   . Anxiety   . Depression   . Aneurysm ([place])   . DM type 2 (diabetes mellitus, type 2) ([place])     Past Surgical History  Procedure Laterality Date  . Lumbar disc surgery    . Corneal transplant surgery left eye    . Lumbar laminectomy      There were no vitals filed for this visit.  Visit Diagnosis:  Falls frequently  Weakness of both lower extremities  Difficulty in walking  Joint stiffness of multiple sites      Subjective Assessment - 09/18/15 1011    Subjective Denies pain.  States last treatment session he was aware that his balance was off.  Patient told receptionist he would like to finish up with therapy soon "because he has a lot of doctor's appointments".       Currently in Pain? No/denies            [facility] PT Assessment - 09/18/15 0001    Berg Balance Test   Sit to Stand Able to stand without using hands and stabilize independently   Standing Unsupported Able to stand safely 2 minutes   Sitting with Back Unsupported but Feet Supported on Floor or Stool Able to sit safely and securely 2 minutes   Stand to Sit Sits safely with minimal use of hands   Transfers Able to transfer safely, minor use of hands   Standing  Unsupported with Eyes Closed Able to stand 10 seconds safely   Standing Ubsupported with Feet Together Able to place feet together independently and stand 1 minute safely   From Standing, Reach Forward with Outstretched Arm Can reach forward >12 cm safely (5")   From Standing Position, Pick up Object from Floor Able to pick up shoe safely and easily   From Standing Position, Turn to Look Behind Over each Shoulder Looks behind one side only/other side shows less weight shift   Turn 360 Degrees Able to turn 360 degrees safely in 4 seconds or less   Standing Unsupported, Alternately Place Feet on Step/Stool Able to stand independently and complete 8 steps >20 seconds   Standing Unsupported, One Foot in Front Able to plae foot ahead of the other independently and hold 30 seconds   Standing on One Leg Able to lift leg independently and hold equal to or more than 3 seconds   Total Score 50   Dynamic Gait Index   Level Surface Normal   Change in Gait Speed Normal   Gait with Horizontal Head Turns Normal   Gait with Vertical Head Turns Normal   Gait and Pivot Turn Normal   Step Over Obstacle Normal  Step Around Obstacles Normal   Steps Normal   Total Score 24   Timed Up and Go Test   TUG Manual TUG   Manual TUG (seconds) 8.7                     [facility] Adult PT Treatment/Exercise - 09/18/15 0001    Knee/Hip Exercises: Aerobic   Nustep L2 10 min   Knee/Hip Exercises: Standing   Forward Lunges Right;Left;1 set;10 reps   Forward Lunges Limitations with UE reach in corner for safety   Other Standing Knee Exercises doorway hip flexor with UE reaches  R/L   Other Standing Knee Exercises B reach and marches against wall with narrow base of support                  PT Short Term Goals - 09/11/15 1709    PT SHORT TERM GOAL #1   Title The patient will be have knowledge of basic strengthening and flexibility HEP   09/14/15   Status Achieved   PT SHORT TERM GOAL #2   Title  The patient will have LE strength to 4+/5 needed to rise from a standard chair without UE assist.     Time 4   Period Weeks   Status On-going   PT SHORT TERM GOAL #3   Title The patient will be able to walk 1000 feet in 6 minutes needed for community ambulation    Time 4   Period Weeks   Status On-going   PT SHORT TERM GOAL #4   Title BERG balance test improved to 50/56 needed for decreased risk of falls    Time 4   Period Weeks   Status On-going           PT Long Term Goals - 09/11/15 1709    PT LONG TERM GOAL #1   Title The patient will be independent with home and gym program for strengthening and balance to decrease risk of falls at home and in the community  10/12/15   Time 8   Period Weeks   Status On-going   PT LONG TERM GOAL #2   Title The patient will have improved LE strength to 5-/5 grossly needed to get up off the floor   Time 8   Period Weeks   Status On-going   PT LONG TERM GOAL #3   Title The patient will be able to walk 15 minutes needed for grocery shopping and socializing in the community   Time 8   Period Weeks   Status On-going   PT LONG TERM GOAL #4   Title BERG balance test improved to 52/56 indicating min risk of falls   Time 8   Period Weeks   Status On-going               Plan - 09/18/15 1229    Clinical Impression Statement The patient has a slight improvement in BERG balance score from 48/56 to 50/56 in 4 weeks.  He continues to have deficits with narrow base of support, staggered step standing and single leg balance, possible secondary to decreased motor and sensory loss in his feet.  Therapist providing close supervision and contact guard at times for safety with dynamic balance ex.  The patient expresses that he may want to finish with PT soon since he has "lots of doctor's appointments."  Discussed importance of continuing ex upon discharge from PT.     PT Next Visit Plan dynamic  balance in standing with narrow or staggered base of  support;  assess progress toward goals to determine continuation of PT         Problem List Patient Active Problem List   Diagnosis Date Noted  . Coronary artery disease due to calcified coronary lesion 06/15/2014  . COPD with emphysema ([place]) 06/15/2014  . Cancer screening 06/08/2014  . Back pain, chronic 04/06/2013  . Diabetes type 2, controlled ([place]) 08/05/2011  . 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 Anxiety state 01/19/2008  . TOBACCO ABUSE 01/19/2008  . Depression with anxiety 01/19/2008  . Hyperlipidemia, group A 12/09/2007  . GENERALIZED ANXIETY DISORDER 12/09/2007  . Essential hypertension 12/09/2007  . Allergic rhinitis 12/09/2007    Alvera Singh 09/18/2015, 2:09 PM  [facility] [address], [place], [place], [address] Phone: [phone]   Fax:  [phone]  Name: Drew Burke MRN: [id number] Date of Birth: Jan 13, 1946    Ruben Im, PT 09/18/2015 2:10 PM Phone: [phone] Fax: [phone]

## 2015-09-20 ENCOUNTER — Encounter: Payer: Self-pay | Admitting: Physical Therapy

## 2015-09-20 ENCOUNTER — Ambulatory Visit: Payer: Medicare Other | Admitting: Physical Therapy

## 2015-09-20 DIAGNOSIS — R296 Repeated falls: Secondary | ICD-10-CM | POA: Diagnosis not present

## 2015-09-20 DIAGNOSIS — M2560 Stiffness of unspecified joint, not elsewhere classified: Secondary | ICD-10-CM

## 2015-09-20 DIAGNOSIS — R262 Difficulty in walking, not elsewhere classified: Secondary | ICD-10-CM

## 2015-09-20 DIAGNOSIS — R29898 Other symptoms and signs involving the musculoskeletal system: Secondary | ICD-10-CM

## 2015-09-20 NOTE — Patient Instructions (Addendum)
SINGLE LIMB STANCE    Stance: single leg on floor. Raise leg. Hold  10 up to 20 seconds. Repeat with other leg. _3__ reps per set,  Many times per day  Every day per week  Copyright  VHI. All rights reserved.   Anterior Hip Dominant Lunge    Hands in front not shown in picture for safety to balance on wall. Lunge forward, keeping erect posture. Bend front knee as much as 90. Return by stepping back. Repeat with the other leg. Repeat  10  Times each leg up to 3 x 10 each leg . Do 2-3 sessions per day.  Copyright  VHI. All rights reserved.  Heel raisesHeel Raise: Standing    Toes on board, heels on floor, knees slightly bent, rise up on toes as high as possible. Do 3  X 10  sets. Complete  2- 3 repetitions.  http://st.exer.us/132   Copyright  VHI. All rights reserved.

## 2015-09-20 NOTE — Therapy (Addendum)
[facility] [address], [place], [place], [address] Phone: [phone]   Fax:  [phone]  Physical Therapy Treatment/Discharge Summary  Patient Details  Name: Drew Burke MRN: [id number] Date of Birth: 09-Mar-1946 Referring Provider: Dr. Carlisle Cater  Encounter Date: 09/20/2015      PT End of Session - 09/20/15 1022    Visit Number 8   Number of Visits 10   Date for PT Re-Evaluation 10/12/15   Authorization Type U[employer] Medicare G codes    PT Start Time 1012-09-25   PT Stop Time 1100   PT Time Calculation (min) 46 min   Activity Tolerance Patient tolerated treatment well   Behavior During Therapy [facility] for tasks assessed/performed      Past Medical History  Diagnosis Date  . Allergic rhinitis   . Hyperlipidemia   . Hypertension   . Tobacco abuse   . Anxiety   . Depression   . Aneurysm ([place])   . DM type 2 (diabetes mellitus, type 2) ([place])     Past Surgical History  Procedure Laterality Date  . Lumbar disc surgery    . Corneal transplant surgery left eye    . Lumbar laminectomy      There were no vitals filed for this visit.  Visit Diagnosis:  Falls frequently  Weakness of both lower extremities  Difficulty in walking  Joint stiffness of multiple sites      Subjective Assessment - 09/20/15 1020    Subjective No complain of pain. Pt rates his improvement as 40 to 50%. He wishes to be D/C today due to having many other MD appointments.   Pertinent History herniated disc lumbar 15 years ago laminectomy, low energy, sprained Rt ankle due to fall, tailbone pain from recent fall.      Limitations Walking   How long can you walk comfortably? 200 yards   Diagnostic tests bloodwork OK except liver panel;  no scans of head   Patient Stated Goals improved balance;  increased energy   Currently in Pain? No/denies                         [facility] Adult PT Treatment/Exercise - 09/20/15 0001     Exercises   Exercises Knee/Hip   Knee/Hip Exercises: Aerobic   Nustep L2 10 min   Knee/Hip Exercises: Machines for Strengthening   Cybex Leg Press St#9, 100# B LE 3x10, 50# Lt/Rt 3x10   Knee/Hip Exercises: Standing   Heel Raises Both;2 sets;10 reps   Forward Lunges Right;Left;10 reps;2 sets   SLS x 4 with 10 sec hold with UE support   Other Standing Knee Exercises doorway hip flexor with UE reaches  R/L   Other Standing Knee Exercises B reach and marches against wall with narrow base of support                PT Education - 09/20/15 1045    Education provided Yes   Education Details SLS, Lunges, heel raises   Person(s) Educated Patient   Methods Demonstration;Verbal cues;Handout   Comprehension Verbalized understanding          PT Short Term Goals - 09/20/15 1025    PT SHORT TERM GOAL #1   Title The patient will be have knowledge of basic strengthening and flexibility HEP   09/14/15   Time 4   Period Weeks   Status Achieved   PT SHORT TERM GOAL #2   Title  The patient will have LE strength to 4+/5 needed to rise from a standard chair without UE assist.     Time 4   Period Weeks   Status Achieved   PT SHORT TERM GOAL #3   Title The patient will be able to walk 1000 feet in 6 minutes needed for community ambulation    Time 4   Period Weeks   Status Achieved   PT SHORT TERM GOAL #4   Title BERG balance test improved to 50/56 needed for decreased risk of falls    Time 4   Period Weeks   Status Achieved           PT Long Term Goals - 13-Oct-2015 1027    PT LONG TERM GOAL #1   Title The patient will be independent with home and gym program for strengthening and balance to decrease risk of falls at home and in the community  10/12/15   Time 8   Period Weeks   Status Achieved   PT LONG TERM GOAL #2   Title The patient will have improved LE strength to 5-/5 grossly needed to get up off the floor   Time 8   Period Weeks   Status Achieved   PT LONG TERM GOAL #3    Title The patient will be able to walk 15 minutes needed for grocery shopping and socializing in the community   Time 8   Period Weeks   Status Partially Met   PT LONG TERM GOAL #4   Title BERG balance test improved to 52/56 indicating min risk of falls   Time 8   Period Weeks   Status Not Met               Plan - 10/13/2015 1022    Clinical Impression Statement Pt with a slight improvement in BERG balance score. He continues to have deficits with his balance with ambulation, possible secondary to decreased motor and sensory loss in his feet.     Pt will benefit from skilled therapeutic intervention in order to improve on the following deficits Decreased balance;Decreased activity tolerance;Decreased strength;Difficulty walking;Pain;Decreased coordination   Rehab Potential Good   Clinical Impairments Affecting Rehab Potential Pt with low energy, decreased balance vs dizziness, 70y.o laminectomy.    PT Frequency 2x / week   PT Duration 8 weeks   PT Treatment/Interventions ADLs/Self Care Home Management;Therapeutic exercise;Therapeutic activities;Patient/family education;Neuromuscular re-education;Cryotherapy;Electrical Stimulation;Moist Heat;Ultrasound   PT Next Visit Plan D/C    Consulted and Agree with Plan of Care Patient          G-Codes - 13-Oct-2015 1104    Functional Assessment Tool Used Merrilee Jansky and TUG   Functional Limitation Mobility: Walking and moving around   Mobility: Walking and Moving Around Goal Status [phone]) At least 20 percent but less than 40 percent impaired, limited or restricted   Mobility: Walking and Moving Around Discharge Status [phone]) At least 1 percent but less than 20 percent impaired, limited or restricted     PHYSICAL THERAPY DISCHARGE SUMMARY  Visits from Start of Care: 8  Current functional level related to goals / functional outcomes: The patient participated in therapeutic ex and neuromuscular re-ed for strength and balance deficits.   Patient requested discharge from PT secondary to "having lots of doctor's appointments."    Remaining deficits: Partial goals met but with remaining balance deficits. See above.    Education / Equipment: HEP provided Plan: Patient agrees to discharge.  Patient goals were partially  met. Patient is being discharged due to being pleased with the current functional level.  ?????         roblem List Patient Active Problem List   Diagnosis Date Noted  . Coronary artery disease due to calcified coronary lesion 06/15/2014  . COPD with emphysema ([place]) 06/15/2014  . Cancer screening 06/08/2014  . Back pain, chronic 04/06/2013  . Diabetes type 2, controlled ([place]) 08/05/2011  . 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 Anxiety state 01/19/2008  . TOBACCO ABUSE 01/19/2008  . Depression with anxiety 01/19/2008  . Hyperlipidemia, group A 12/09/2007  . GENERALIZED ANXIETY DISORDER 12/09/2007  . Essential hypertension 12/09/2007  . Allergic rhinitis 12/09/2007    Ruben Im, PT 10/05/2015 7:48 AM Phone: [phone] Fax: [phone] Denario Bagot Naumann-Houegnifio, PTA 09/20/2015 11:06 AM  [facility] [address] [place], [place], [address] Phone: [phone]   Fax:  [phone]  Name: Drew Burke MRN: [id number] Date of Birth: 02-05-1946

## 2015-09-26 ENCOUNTER — Other Ambulatory Visit: Payer: Self-pay | Admitting: Acute Care

## 2015-09-26 DIAGNOSIS — F17210 Nicotine dependence, cigarettes, uncomplicated: Secondary | ICD-10-CM

## 2015-10-16 ENCOUNTER — Other Ambulatory Visit: Payer: Self-pay | Admitting: Adult Health

## 2015-10-17 NOTE — Telephone Encounter (Signed)
Ok to refill 

## 2015-10-17 NOTE — Telephone Encounter (Signed)
Ok to refill for one month  

## 2015-10-20 ENCOUNTER — Other Ambulatory Visit: Payer: Self-pay | Admitting: Adult Health

## 2015-10-22 NOTE — Telephone Encounter (Signed)
Ok to refill 

## 2015-10-25 ENCOUNTER — Other Ambulatory Visit: Payer: Self-pay | Admitting: Adult Health

## 2015-11-23 IMAGING — CR DG CHEST 2V
2 series · 2 of 2 positions shown · non-contrast
Comparison: June 04, 2011.

CLINICAL DATA: Cough.

EXAM:
CHEST  2 VIEW

[view not recorded (1 of 2)]
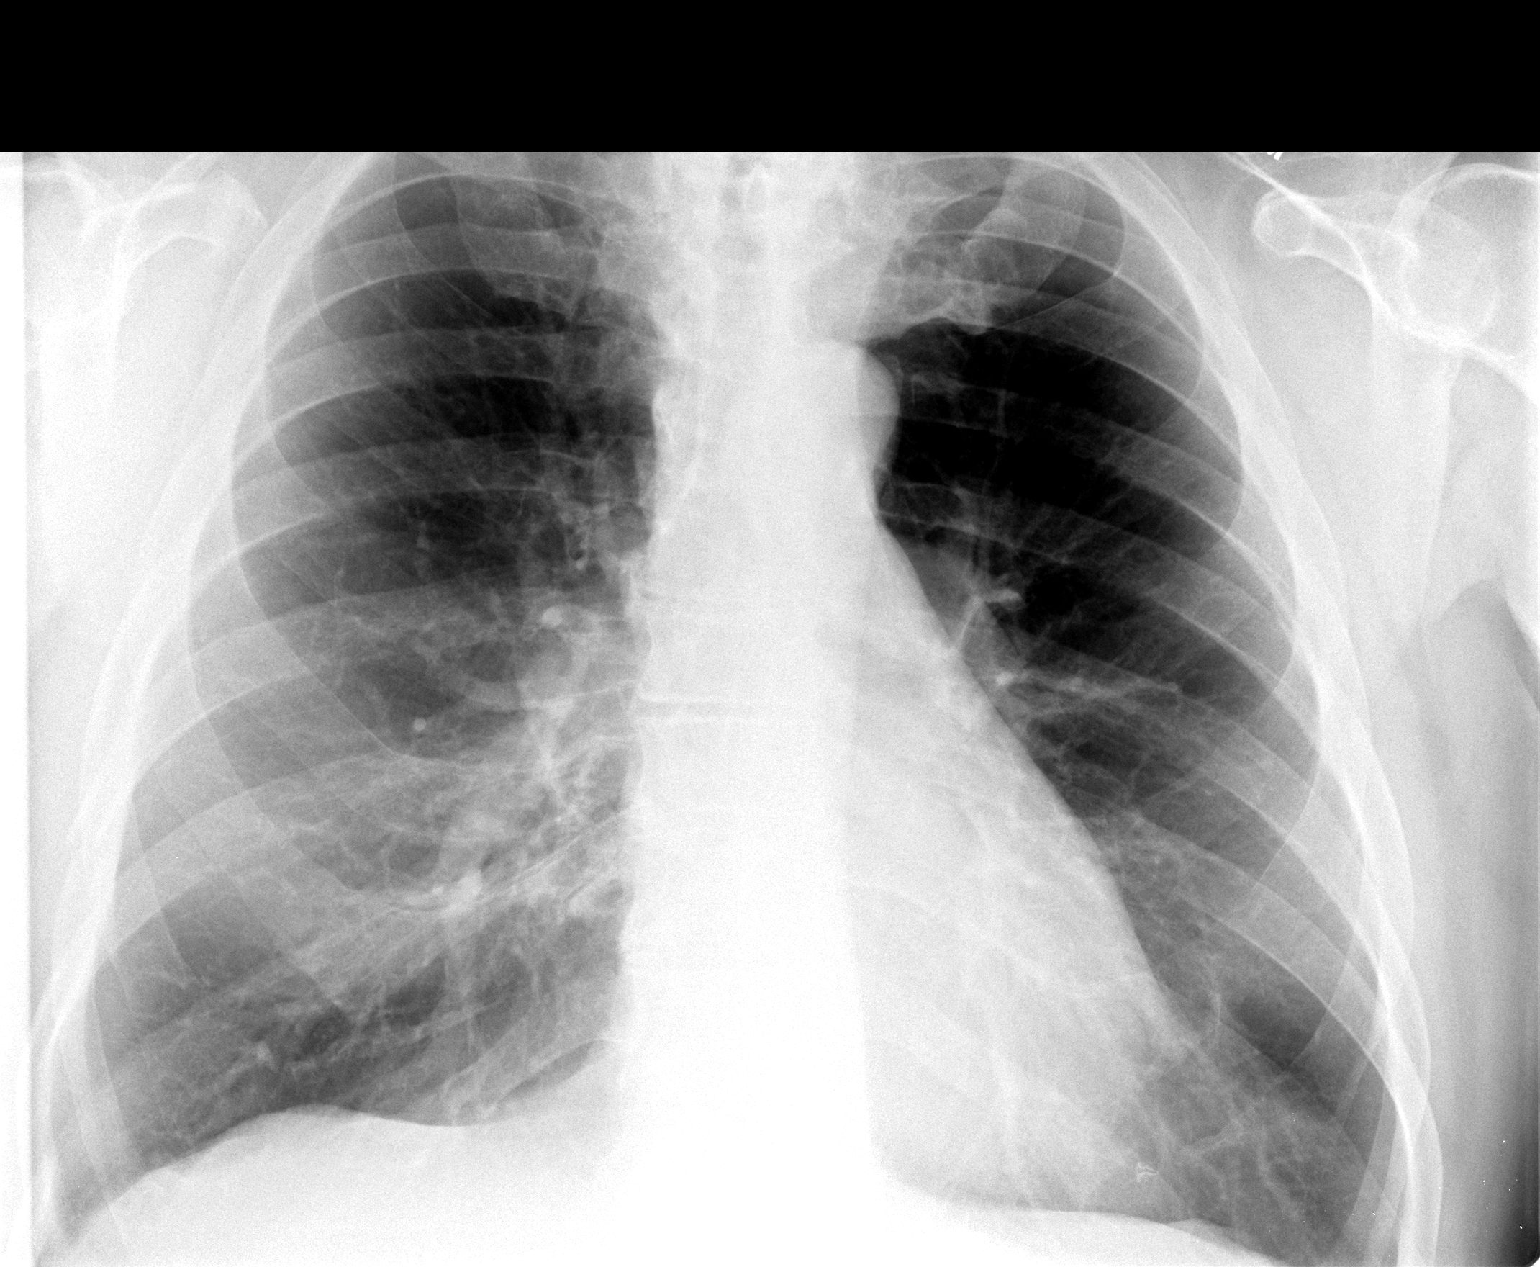

[view not recorded (2 of 2)]
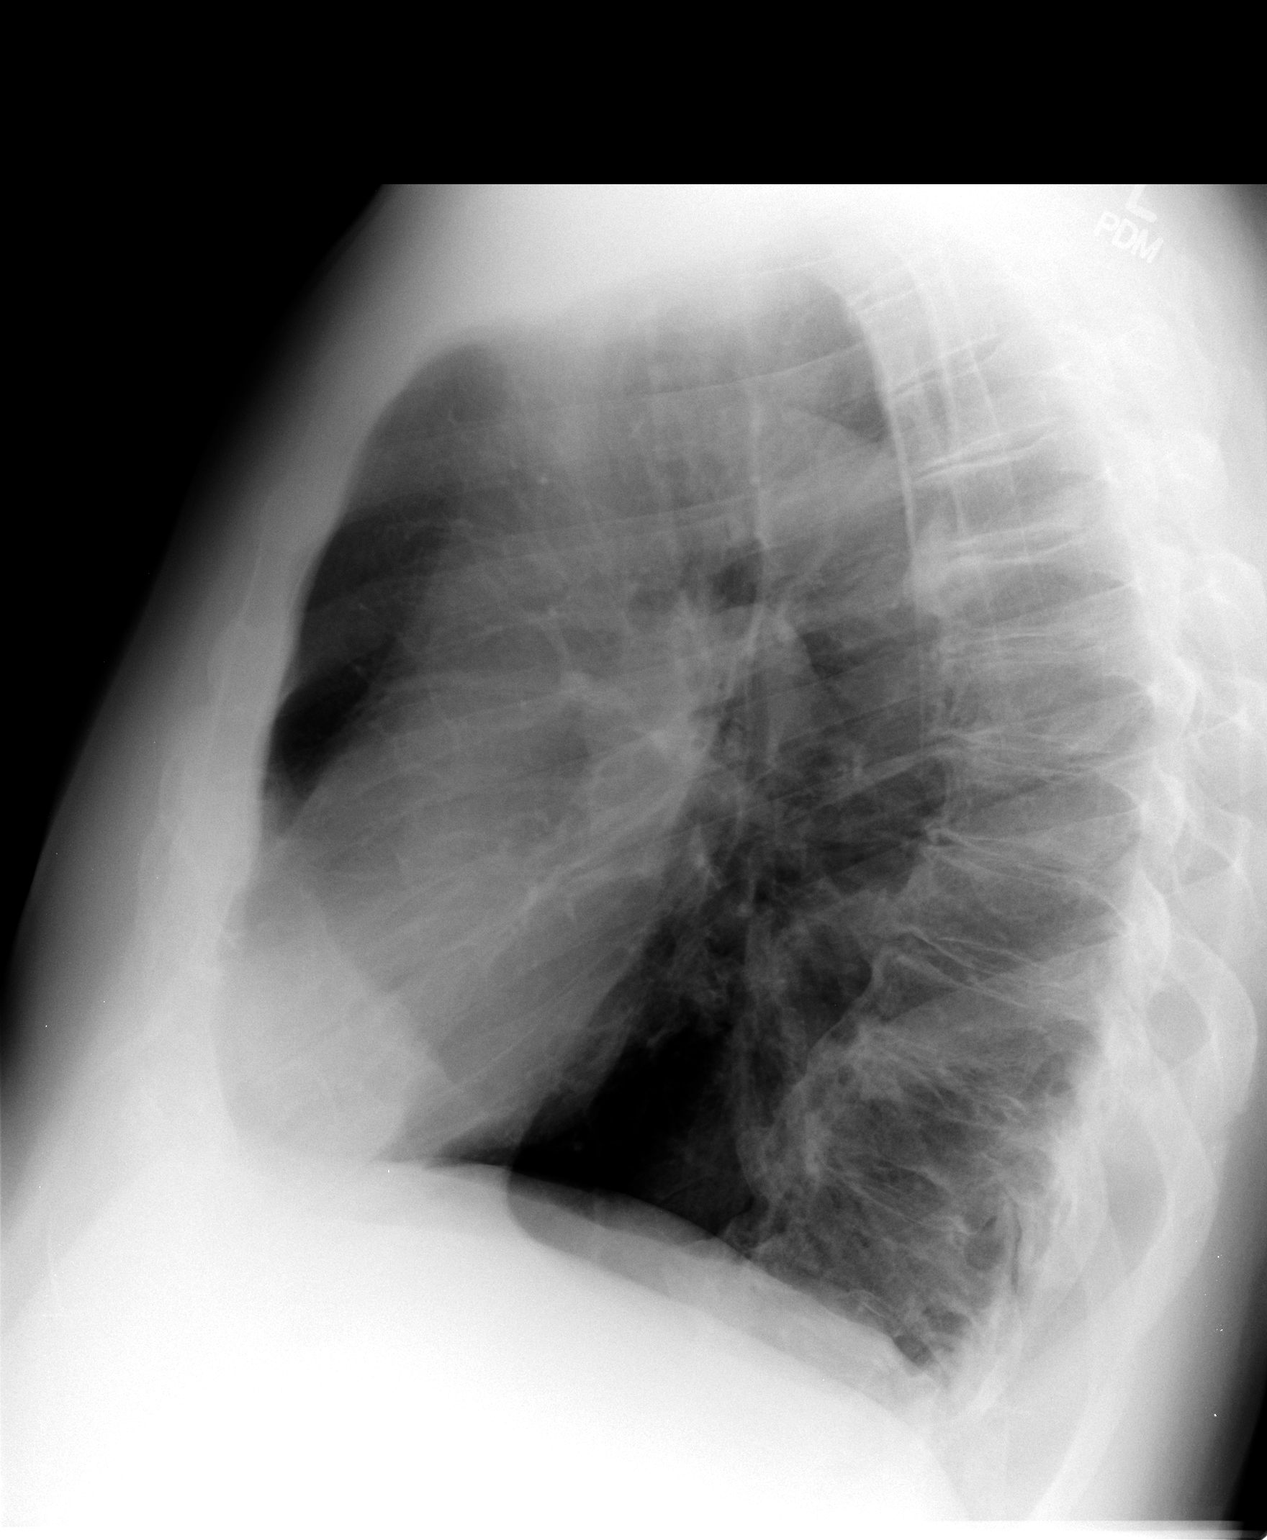

[2 of 2 positions shown; findings below may reference images not displayed]

FINDINGS: The heart size and mediastinal contours are within normal limits. No
pneumothorax or pleural effusion is noted. Hyperinflation of the
lungs is noted. No acute pulmonary disease is noted. Multilevel
degenerative disc disease is noted in the thoracic spine.
IMPRESSION: Findings consistent with chronic obstructive pulmonary disease. No
acute cardiopulmonary abnormality seen.

## 2015-11-29 ENCOUNTER — Ambulatory Visit: Payer: Medicare Other | Admitting: Adult Health

## 2015-12-04 ENCOUNTER — Ambulatory Visit (INDEPENDENT_AMBULATORY_CARE_PROVIDER_SITE_OTHER): Payer: Medicare Other | Admitting: Adult Health

## 2015-12-04 ENCOUNTER — Encounter: Payer: Self-pay | Admitting: Adult Health

## 2015-12-04 VITALS — BP 140/80 | Temp 97.9°F | Ht 74.0 in | Wt 229.0 lb

## 2015-12-04 DIAGNOSIS — R198 Other specified symptoms and signs involving the digestive system and abdomen: Secondary | ICD-10-CM

## 2015-12-04 DIAGNOSIS — F17200 Nicotine dependence, unspecified, uncomplicated: Secondary | ICD-10-CM | POA: Diagnosis not present

## 2015-12-04 DIAGNOSIS — E1121 Type 2 diabetes mellitus with diabetic nephropathy: Secondary | ICD-10-CM

## 2015-12-04 DIAGNOSIS — I10 Essential (primary) hypertension: Secondary | ICD-10-CM | POA: Diagnosis not present

## 2015-12-04 DIAGNOSIS — R194 Change in bowel habit: Secondary | ICD-10-CM

## 2015-12-04 LAB — POCT GLYCOSYLATED HEMOGLOBIN (HGB A1C): HEMOGLOBIN A1C: 6.3

## 2015-12-04 NOTE — Progress Notes (Signed)
Subjective:    Patient ID: Drew Burke, male    DOB: 1945/10/16, 70 y.o.   MRN: [id number]  HPI  70 year old male who presents to the office today for three month follow up regarding hypertension and diabetes.   He reports that he is " feeling pretty darn good". He is no longer taking hydrocodone and does not have any chronic pain  His blood pressure is 140/80 today in the office. He just took his medication this morning before coming to the office   He is not checking his blood sugars at home.   He continues to smoke and does not want to quit.   He reports that since stopping Hydrocodone he has been having more frequent bowel movements over the last two months. This has been improving. Denies any nausea, vomiting or abdominal pain. His last colonoscopy was in 2004.     Review of Systems  Constitutional: Negative.   HENT: Negative.   Respiratory: Negative.   Cardiovascular: Negative.   Gastrointestinal: Negative.   Genitourinary: Negative.   Musculoskeletal: Negative.   Skin: Negative.   Neurological: Negative.   Hematological: Negative.   All other systems reviewed and are negative.  Past Medical History  Diagnosis Date  . Allergic rhinitis   . Hyperlipidemia   . Hypertension   . Tobacco abuse   . Anxiety   . Depression   . Aneurysm ([place])   . DM type 2 (diabetes mellitus, type 2) ([place])     Social History   Social History  . Marital Status: Divorced    Spouse Name: N/A  . Number of Children: N/A  . Years of Education: N/A   Occupational History  . sales    Social History Main Topics  . Smoking status: Current Every Day Smoker -- 1.00 packs/day for 54 years    Types: Cigarettes    Start date: 07/07/1960  . Smokeless tobacco: Never Used     Comment: used to smoke 3 ppd - currently smoking 1 ppd - started smoking at age 88.  Marland Kitchen Alcohol Use: 16.8 oz/week    28 Shots of liquor per week     Comment: Patient thinks he self medicates with alcohol for back  pain. Does not drink if taking hydrocodone  . Drug Use: No  . Sexual Activity: Not on file   Other Topics Concern  . Not on file   Social History Narrative   Retired - Diplomatic Services operational officer by himself   Has a dog       Past Surgical History  Procedure Laterality Date  . Lumbar disc surgery    . Corneal transplant surgery left eye    . Lumbar laminectomy      Family History  Problem Relation Age of Onset  . Depression Other   . Hypertension Other   . Heart disease Father   . Alcohol abuse Father   . Cancer Mother     intestines    No Known Allergies  Current Outpatient Prescriptions on File Prior to Visit  Medication Sig Dispense Refill  . albuterol (PROAIR HFA) 108 (90 BASE) MCG/ACT inhaler INHALE 2 PUFFS INTO THE LUNGS EVERY 4 (FOUR) HOURS AS NEEDED FOR WHEEZING. 18 g 6  . aspirin 81 MG tablet Take 81 mg by mouth daily.      . diazepam (VALIUM) 5 MG tablet TAKE 1 TABLET BY MOUTH 3 TIMES A DAY 90 tablet 0  . escitalopram (LEXAPRO) 20 MG tablet Take  1 tablet (20 mg total) by mouth daily. 30 tablet 6  . glucose blood (ONETOUCH VERIO) test strip Check twice daily. 100 each 12  . losartan-hydrochlorothiazide (HYZAAR) 100-25 MG tablet TAKE 1 TABLET BY MOUTH EVERY DAY 100 tablet 1  . losartan-hydrochlorothiazide (HYZAAR) 100-25 MG tablet TAKE 1 TABLET BY MOUTH EVERY DAY 100 tablet 2  . metFORMIN (GLUCOPHAGE) 1000 MG tablet TAKE 1 TABLET BY MOUTH TWICE A DAY 200 tablet 2  . ONETOUCH DELICA LANCETS FINE MISC Test twice daily. 100 each 5  . potassium chloride (K-DUR) 10 MEQ tablet Take 1 tablet (10 mEq total) by mouth daily. 30 tablet 6  . simvastatin (ZOCOR) 40 MG tablet TAKE 1 TABLET BY MOUTH AT BEDTIME 100 tablet 1  . triamcinolone cream (KENALOG) 0.5 % APPLY TOPICALLY TWICE A DAY 30 g 0   No current facility-administered medications on file prior to visit.    BP 140/80 mmHg  Temp(Src) 97.9 F (36.6 C) (Oral)  Ht 6\' 2"  (1.88 m)  Wt 229 lb (103.874 kg)  BMI 29.39  kg/m2       Objective:   Physical Exam  Constitutional: He is oriented to person, place, and time. He appears well-developed and well-nourished. No distress.  Cardiovascular: Normal rate, regular rhythm, normal heart sounds and intact distal pulses.   No murmur heard. Pulmonary/Chest: Effort normal and breath sounds normal. No respiratory distress. He has no wheezes. He has no rales. He exhibits no tenderness.  Abdominal: Soft. Bowel sounds are normal. He exhibits no distension and no mass. There is no tenderness. There is no rebound and no guarding.  Musculoskeletal: Normal range of motion. He exhibits no edema or tenderness.  Neurological: He is alert and oriented to person, place, and time.  Skin: Skin is warm and dry. No rash noted. He is not diaphoretic. No erythema. No pallor.  Psychiatric: He has a normal mood and affect. His behavior is normal. Judgment and thought content normal.  Nursing note and vitals reviewed.     Assessment & Plan:  1. Essential hypertension - .[place] continues to be well controlled BP Readings from Last 3 Encounters:  12/04/15 140/80  08/13/15 128/64  06/06/15 134/70   - Continue with current medication   2. Controlled type 2 diabetes mellitus with diabetic nephropathy, without long-term current use of insulin (HCC) - POC HgB A1c 6.3  - Follow up in 3 months 3. TOBACCO ABUSE - Needs to quit smoking   4. Frequent bowel movements - Add pro biotic  - Ambulatory referral to Gastroenterology  [name], NP

## 2015-12-04 NOTE — Patient Instructions (Addendum)
It was great seeing you today!  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

## 2015-12-05 ENCOUNTER — Encounter: Payer: Self-pay | Admitting: Adult Health

## 2015-12-05 ENCOUNTER — Other Ambulatory Visit: Payer: Self-pay | Admitting: Adult Health

## 2015-12-11 ENCOUNTER — Other Ambulatory Visit: Payer: Self-pay | Admitting: Adult Health

## 2015-12-23 ENCOUNTER — Other Ambulatory Visit: Payer: Self-pay | Admitting: Adult Health

## 2015-12-24 NOTE — Telephone Encounter (Signed)
Medication called in to pharmacy.

## 2015-12-24 NOTE — Telephone Encounter (Signed)
Cal in #60 with no rf

## 2016-01-22 ENCOUNTER — Other Ambulatory Visit: Payer: Self-pay | Admitting: Family Medicine

## 2016-02-11 ENCOUNTER — Other Ambulatory Visit: Payer: Self-pay | Admitting: Family Medicine

## 2016-02-11 NOTE — Telephone Encounter (Signed)
Ok to refill for one month  

## 2016-02-20 ENCOUNTER — Encounter (INDEPENDENT_AMBULATORY_CARE_PROVIDER_SITE_OTHER): Payer: Self-pay

## 2016-02-20 ENCOUNTER — Ambulatory Visit (AMBULATORY_SURGERY_CENTER): Payer: Self-pay | Admitting: *Deleted

## 2016-02-20 ENCOUNTER — Encounter: Payer: Self-pay | Admitting: Gastroenterology

## 2016-02-20 VITALS — Ht 74.0 in | Wt 237.0 lb

## 2016-02-20 DIAGNOSIS — Z1211 Encounter for screening for malignant neoplasm of colon: Secondary | ICD-10-CM

## 2016-02-20 MED ORDER — NA SULFATE-K SULFATE-MG SULF 17.5-3.13-1.6 GM/177ML PO SOLN: 1.0000 | Freq: Once | ORAL | 0 refills | Status: AC

## 2016-02-20 NOTE — Progress Notes (Signed)
No egg or soy allergy known to patient  No issues with past sedation with any surgeries  or procedures, no intubation problems  No diet pills per patient No home 02 use per patient  No blood thinners per patient  Pt denies issues with constipation   

## 2016-02-27 ENCOUNTER — Other Ambulatory Visit: Payer: Self-pay | Admitting: *Deleted

## 2016-02-27 MED ORDER — ESCITALOPRAM OXALATE 20 MG PO TABS: 20.0000 mg | ORAL_TABLET | Freq: Every day | ORAL | 1 refills | Status: DC

## 2016-02-27 MED ORDER — POTASSIUM CHLORIDE ER 10 MEQ PO TBCR: 10.0000 meq | EXTENDED_RELEASE_TABLET | Freq: Every day | ORAL | 1 refills | Status: DC

## 2016-02-27 NOTE — Telephone Encounter (Signed)
Sent in Rx refill for Klor Con

## 2016-02-29 ENCOUNTER — Other Ambulatory Visit: Payer: Self-pay | Admitting: Adult Health

## 2016-03-05 ENCOUNTER — Encounter: Payer: Self-pay | Admitting: Gastroenterology

## 2016-03-05 ENCOUNTER — Ambulatory Visit (AMBULATORY_SURGERY_CENTER): Payer: Medicare Other | Admitting: Gastroenterology

## 2016-03-05 VITALS — BP 113/52 | HR 61 | Temp 98.0°F | Resp 13 | Ht 74.0 in | Wt 229.0 lb

## 2016-03-05 DIAGNOSIS — D120 Benign neoplasm of cecum: Secondary | ICD-10-CM | POA: Diagnosis not present

## 2016-03-05 DIAGNOSIS — D122 Benign neoplasm of ascending colon: Secondary | ICD-10-CM | POA: Diagnosis not present

## 2016-03-05 DIAGNOSIS — D123 Benign neoplasm of transverse colon: Secondary | ICD-10-CM | POA: Diagnosis not present

## 2016-03-05 DIAGNOSIS — Z1211 Encounter for screening for malignant neoplasm of colon: Secondary | ICD-10-CM

## 2016-03-05 LAB — GLUCOSE, CAPILLARY
GLUCOSE-CAPILLARY: 145 mg/dL — AB (ref 65–99)
Glucose-Capillary: 122 mg/dL — ABNORMAL HIGH (ref 65–99)

## 2016-03-05 MED ORDER — SODIUM CHLORIDE 0.9 % IV SOLN: 500.0000 mL | INTRAVENOUS | Status: DC

## 2016-03-05 NOTE — Op Note (Addendum)
[place] Patient Name: Drew Burke Procedure Date: 03/05/2016 8:21 AM MRN: [id number] Endoscopist: Remo Lipps P. Havery Moros , MD Age: 70 Referring MD:  Date of Birth: [birth year] Gender: Male Account #: [id number] Procedure:                Colonoscopy Indications:              Screening for malignant neoplasm in the colon Medicines:                Monitored Anesthesia Care Procedure:                Pre-Anesthesia Assessment:                           - Prior to the procedure, a History and Physical                            was performed, and patient medications and                            allergies were reviewed. The patient's tolerance of                            previous anesthesia was also reviewed. The risks                            and benefits of the procedure and the sedation                            options and risks were discussed with the patient.                            All questions were answered, and informed consent                            was obtained. Prior Anticoagulants: The patient has                            taken aspirin, last dose was 1 day prior to                            procedure. ASA Grade Assessment: III - A patient                            with severe systemic disease. After reviewing the                            risks and benefits, the patient was deemed in                            satisfactory condition to undergo the procedure.                           After obtaining informed consent, the colonoscope  was passed under direct vision. Throughout the                            procedure, the patient's blood pressure, pulse, and                            oxygen saturations were monitored continuously. The                            Model CF-HQ190L [id number]) scope was introduced                            through the anus and advanced to the the cecum,   identified by appendiceal orifice and ileocecal                            valve. The colonoscopy was performed without                            difficulty. The patient tolerated the procedure                            well. The quality of the bowel preparation was                            adequate. The ileocecal valve, appendiceal orifice,                            and rectum were photographed. Scope In: 8:28:35 AM Scope Out: 8:59:32 AM Scope Withdrawal Time: 0 hours 28 minutes 26 seconds  Total Procedure Duration: 0 hours 30 minutes 57 seconds  Findings:                 The perianal and digital rectal examinations were                            normal.                           A 4 mm polyp was found in the cecum. The polyp was                            sessile. The polyp was removed with a cold snare.                            Resection and retrieval were complete.                           A 18 mm polyp was found in the ascending colon. The                            polyp was sessile. The polyp was removed with a hot  snare. Resection and retrieval were complete.                           Two sessile polyps were found in the ascending                            colon. The polyps were 5 to 7 mm in size. These                            polyps were removed with a cold snare. Resection                            and retrieval were complete.                           Three sessile polyps were found in the transverse                            colon. The polyps were 8 to 12 mm in size. These                            polyps were removed with a cold snare. Resection                            and retrieval were complete.                           A 5 mm polyp was found in the splenic flexure. The                            polyp was sessile. The polyp was removed with a                            cold snare. Resection and retrieval were complete.                            Non-bleeding internal hemorrhoids were found during                            retroflexion.                           The exam was otherwise without abnormality. Complications:            No immediate complications. Estimated blood loss:                            Minimal. Estimated Blood Loss:     Estimated blood loss was minimal. Impression:               - One 4 mm polyp in the cecum, removed with a cold                            snare. Resected and retrieved.                           -  One 18 mm polyp in the ascending colon, removed                            with a hot snare. Resected and retrieved.                           - Two 5 to 7 mm polyps in the ascending colon,                            removed with a cold snare. Resected and retrieved.                           - Three 8 to 12 mm polyps in the transverse colon,                            removed with a cold snare. Resected and retrieved.                           - One 5 mm polyp at the splenic flexure, removed                            with a cold snare. Resected and retrieved.                           - Non-bleeding internal hemorrhoids.                           - The examination was otherwise normal. Recommendation:           - Patient has a contact number available for                            emergencies. The signs and symptoms of potential                            delayed complications were discussed with the                            patient. Return to normal activities tomorrow.                            Written discharge instructions were provided to the                            patient.                           - Resume previous diet.                           - Continue present medications.                           - No aspirin, ibuprofen, naproxen, or other  non-steroidal anti-inflammatory drugs for 2 weeks                            after polyp  removal.                           - Await pathology results.                           - Repeat colonoscopy is recommended for                            surveillance. The colonoscopy date will be                            determined after pathology results from today's                            exam become available for review. Remo Lipps P. Havery Moros, MD 03/05/2016 9:06:38 AM This report has been signed electronically. Addendum Number: 1   Addendum Date: 03/05/2016 9:07:53 AM      Note ammended to report that the largest polyp in the ascending colon,       the bulk of it was removed with hot snare, however there was an       extension of flat polyp roughly 71mm or so at the base which was removed       with cold snare. Remo Lipps P. Colen Eltzroth, MD 03/05/2016 9:08:39 AM This report has been signed electronically.

## 2016-03-05 NOTE — Progress Notes (Signed)
Called to room to assist during endoscopic procedure.  Patient ID and intended procedure confirmed with present staff. Received instructions for my participation in the procedure from the performing physician.  

## 2016-03-05 NOTE — Progress Notes (Signed)
To pacu vss patent aw reprot to rn 

## 2016-03-05 NOTE — Patient Instructions (Addendum)
YOU HAD AN ENDOSCOPIC PROCEDURE TODAY AT [facility]:   Refer to the procedure report that was given to you for any specific questions about what was found during the examination.  If the procedure report does not answer your questions, please call your gastroenterologist to clarify.  If you requested that your care partner not be given the details of your procedure findings, then the procedure report has been included in a sealed envelope for you to review at your convenience later.  YOU SHOULD EXPECT: Some feelings of bloating in the abdomen. Passage of more gas than usual.  Walking can help get rid of the air that was put into your GI tract during the procedure and reduce the bloating. If you had a lower endoscopy (such as a colonoscopy or flexible sigmoidoscopy) you may notice spotting of blood in your stool or on the toilet paper. If you underwent a bowel prep for your procedure, you may not have a normal bowel movement for a few days.  Please Note:  You might notice some irritation and congestion in your nose or some drainage.  This is from the oxygen used during your procedure.  There is no need for concern and it should clear up in a day or so.  SYMPTOMS TO REPORT IMMEDIATELY:   Following lower endoscopy (colonoscopy or flexible sigmoidoscopy):  Excessive amounts of blood in the stool  Significant tenderness or worsening of abdominal pains  Swelling of the abdomen that is new, acute  Fever of 100F or higher   Following upper endoscopy (EGD)  Vomiting of blood or coffee ground material  New chest pain or pain under the shoulder blades  Painful or persistently difficult swallowing  New shortness of breath  Fever of 100F or higher  Black, tarry-looking stools  For urgent or emergent issues, a gastroenterologist can be reached at any hour by calling [phone].   DIET:  We do recommend a small meal at first, but then you may proceed to your regular diet.  Drink  plenty of fluids but you should avoid alcoholic beverages for 24 hours.  ACTIVITY:  You should plan to take it easy for the rest of today and you should NOT DRIVE or use heavy machinery until tomorrow (because of the sedation medicines used during the test).    FOLLOW UP: Our staff will call the number listed on your records the next business day following your procedure to check on you and address any questions or concerns that you may have regarding the information given to you following your procedure. If we do not reach you, we will leave a message.  However, if you are feeling well and you are not experiencing any problems, there is no need to return our call.  We will assume that you have returned to your regular daily activities without incident.  If any biopsies were taken you will be contacted by phone or by letter within the next 1-3 weeks.  Please call us at [phone] if you have not heard about the biopsies in 3 weeks.    SIGNATURES/CONFIDENTIALITY: You and/or your care partner have signed paperwork which will be entered into your electronic medical record.  These signatures attest to the fact that that the information above on your After Visit Summary has been reviewed and is understood.  Full responsibility of the confidentiality of this discharge information lies with you and/or your care-partner.    Handouts were given to your care partner on polyps  and hemorrhoids. No aspirin, aspirin products,  ibuprofen, naproxen, advil, motrin, aleve, or other non-steroidal anti-inflammatory drugs for 14 days after polyp removal. You may resume your other current medications today. Your blood sugar was 122 in the recovery room.  Await biopsy results. Please call if any questions or concerns.

## 2016-03-05 NOTE — Progress Notes (Signed)
No problems noted in the recovery room. maw 

## 2016-03-06 ENCOUNTER — Telehealth: Payer: Self-pay

## 2016-03-06 NOTE — Telephone Encounter (Signed)
  Follow up Call-  Call back number 03/05/2016  Post procedure Call Back phone  # [phone]  Permission to leave phone message Yes  Some recent data might be hidden     Patient questions:  Do you have a fever, pain , or abdominal swelling? No. Pain Score  0 *  Have you tolerated food without any problems? Yes.    Have you been able to return to your normal activities? Yes.    Do you have any questions about your discharge instructions: Diet   No. Medications  No. Follow up visit  No.  Do you have questions or concerns about your Care? No.  Actions: * If pain score is 4 or above: No action needed, pain <4.

## 2016-03-11 ENCOUNTER — Encounter: Payer: Self-pay | Admitting: Gastroenterology

## 2016-04-02 ENCOUNTER — Ambulatory Visit: Payer: Medicare Other | Admitting: Adult Health

## 2016-04-03 ENCOUNTER — Other Ambulatory Visit: Payer: Self-pay | Admitting: Adult Health

## 2016-04-03 NOTE — Telephone Encounter (Signed)
Okay to refill? 

## 2016-04-03 NOTE — Telephone Encounter (Signed)
Ok to refill for one month  

## 2016-04-03 NOTE — Telephone Encounter (Signed)
Rx called in as directed.   

## 2016-04-09 ENCOUNTER — Encounter: Payer: Self-pay | Admitting: Adult Health

## 2016-04-09 ENCOUNTER — Ambulatory Visit (INDEPENDENT_AMBULATORY_CARE_PROVIDER_SITE_OTHER): Payer: Medicare Other | Admitting: Adult Health

## 2016-04-09 VITALS — BP 130/70 | Temp 98.2°F | Ht 74.0 in | Wt 239.5 lb

## 2016-04-09 DIAGNOSIS — I10 Essential (primary) hypertension: Secondary | ICD-10-CM | POA: Diagnosis not present

## 2016-04-09 DIAGNOSIS — Z23 Encounter for immunization: Secondary | ICD-10-CM

## 2016-04-09 DIAGNOSIS — R2681 Unsteadiness on feet: Secondary | ICD-10-CM

## 2016-04-09 DIAGNOSIS — E1121 Type 2 diabetes mellitus with diabetic nephropathy: Secondary | ICD-10-CM

## 2016-04-09 LAB — POCT GLYCOSYLATED HEMOGLOBIN (HGB A1C): HEMOGLOBIN A1C: 6.4

## 2016-04-09 NOTE — Patient Instructions (Addendum)
It was great seeing you today!  Someone from physical therapy will call you to schedule your appointments  You are do for your annual physical. Please schedule that   Let me know if you need anything

## 2016-04-09 NOTE — Progress Notes (Signed)
Subjective:    Patient ID: Drew Burke, male    DOB: [birth year], 70 y.o.   MRN: [id number]  HPI  70 year old male who  has a past medical history of Allergic rhinitis; Allergy; Aneurysm ([place]); Anxiety; COPD (chronic obstructive pulmonary disease) ([place]); Depression; DM type 2 (diabetes mellitus, type 2) ([place]); Hyperlipidemia; Hypertension; and Tobacco abuse.   He presents to the office today for follow up regarding hypertension and diabetes.   He continues to not check his blood sugars at home. He is currently taking Metformin 1000mg   His blood pressure is 130/70 in the office today. He takes Hyzaar 100-25  He continues to smoke and does not want to quit at this time.   His weight is up 10 pounds since August. He reports that his weight is up do to not being active. He feels off balance and would like to go back to PT for gait training so that he can get back to walking. He denies falling or feeling dizzy   Review of Systems  Constitutional: Negative.   HENT: Negative.   Eyes: Negative.   Respiratory: Negative.   Musculoskeletal: Positive for arthralgias and gait problem. Negative for myalgias.  Skin: Negative.   Neurological:       Off balance   All other systems reviewed and are negative.  Past Medical History:  Diagnosis Date  . Allergic rhinitis   . Allergy    pt denies  . Aneurysm ([place])   . Anxiety   . COPD (chronic obstructive pulmonary disease) ([place])   . Depression   . DM type 2 (diabetes mellitus, type 2) ([place])   . Hyperlipidemia   . Hypertension   . Tobacco abuse     Social History   Social History  . Marital status: Divorced    Spouse name: N/A  . Number of children: N/A  . 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 Smoking status: Current Every Day Smoker    Packs/day: 1.00    Years: 54.00    Types: Cigarettes    Start date: 07/07/1960  . Smokeless tobacco: Never Used   Comment: used to smoke 3 ppd - currently smoking 1 ppd - started smoking at age 78.  Marland Kitchen Alcohol use 16.8 oz/week    28 Shots of liquor per week     Comment: Patient thinks he self medicates with alcohol for back pain. Does not drink if taking hydrocodone- 2 scothches a day   . Drug use: No  . Sexual activity: Not on file   Other Topics Concern  . Not on file   Social History Narrative   Retired - Diplomatic Services operational officer by himself   Has a dog       Past Surgical History:  Procedure Laterality Date  . COLONOSCOPY    . corneal transplant surgery left eye    . FOOT SURGERY     hammer toe and bone spur  . LUMBAR DISC SURGERY    . LUMBAR LAMINECTOMY      Family History  Problem Relation Age of Onset  . Depression Other   . Hypertension Other   . Heart disease Father   . Alcohol abuse Father   . Cancer Mother     intestines-duodenal cancer vs stomach per pt.  . Stomach cancer Mother   . Colon cancer Neg Hx   . Colon polyps Neg Hx   .  Esophageal cancer Neg Hx   . Rectal cancer Neg Hx     No Known Allergies  Current Outpatient Prescriptions on File Prior to Visit  Medication Sig Dispense Refill  . albuterol (PROAIR HFA) 108 (90 BASE) MCG/ACT inhaler INHALE 2 PUFFS INTO THE LUNGS EVERY 4 (FOUR) HOURS AS NEEDED FOR WHEEZING. 18 g 6  . aspirin 81 MG tablet Take 81 mg by mouth daily.      . Cyanocobalamin (VITAMIN B 12 PO) Take 2,000 mcg by mouth daily.    . diazepam (VALIUM) 5 MG tablet TAKE 1 TABLET BY MOUTH 3 TIMES A DAY 90 tablet 0  . escitalopram (LEXAPRO) 20 MG tablet Take 1 tablet (20 mg total) by mouth daily. 90 tablet 1  . glucose blood (ONETOUCH VERIO) test strip Check twice daily. 100 each 12  . losartan-hydrochlorothiazide (HYZAAR) 100-25 MG tablet TAKE 1 TABLET BY MOUTH EVERY DAY 100 tablet 1  . losartan-hydrochlorothiazide (HYZAAR) 100-25 MG tablet TAKE 1 TABLET BY MOUTH EVERY DAY 100 tablet 2  . metFORMIN (GLUCOPHAGE) 1000 MG tablet TAKE 1 TABLET BY MOUTH TWICE A DAY 200  tablet 1  . ONETOUCH DELICA LANCETS FINE MISC Test twice daily. 100 each 5  . potassium chloride (K-DUR) 10 MEQ tablet Take 1 tablet (10 mEq total) by mouth daily. 90 tablet 1  . Probiotic Product (ALIGN PO) Take 1 capsule by mouth daily.    . simvastatin (ZOCOR) 40 MG tablet TAKE 1 TABLET BY MOUTH AT BEDTIME 90 tablet 0  . triamcinolone cream (KENALOG) 0.5 % APPLY TOPICALLY TWICE A DAY 30 g 0   Current Facility-Administered Medications on File Prior to Visit  Medication Dose Route Frequency Provider Last Rate Last Dose  . 0.9 %  sodium chloride infusion  500 mL Intravenous Continuous Manus Gunning, MD        BP 130/70   Temp 98.2 F (36.8 C) (Oral)   Ht 6\' 2"  (1.88 m)   Wt 239 lb 8 oz (108.6 kg)   BMI 30.75 kg/m       Objective:   Physical Exam  Constitutional: He is oriented to person, place, and time. He appears well-developed and well-nourished. No distress.  Cardiovascular: Normal rate, regular rhythm, normal heart sounds and intact distal pulses.  Exam reveals no gallop and no friction rub.   No murmur heard. Pulmonary/Chest: Effort normal and breath sounds normal. No respiratory distress. He has no wheezes. He has no rales. He exhibits no tenderness.  Musculoskeletal: Normal range of motion. He exhibits no edema, tenderness or deformity.  Able to climb onto exam table without difficulty.  Has slow steady gait.  Neurological: He is alert and oriented to person, place, and time.  Skin: Skin is warm and dry. No rash noted. He is not diaphoretic. No erythema. No pallor.  Psychiatric: He has a normal mood and affect. His behavior is normal. Judgment and thought content normal.  Nursing note and vitals reviewed.     Assessment & Plan:  1. Essential hypertension - Near goal  - Continue current therapy  - Follow up in 3 months for CPE 2. Controlled type 2 diabetes mellitus with diabetic nephropathy, without long-term current use of insulin (HCC) - POC HgB A1c -  6.4 - No change in medications - Advised to start checking blood sugars at home - Follow up in three months for CPE  3. Need for prophylactic vaccination and inoculation against influenza  - Flu vaccine HIGH DOSE PF (Fluzone High dose)  4. Gait instability - Ambulatory referral to Physical Therapy - Advised to move slowly when getting up from a laying or standing position - I advised to use a cane if needed. He refused  [name], NP

## 2016-04-18 ENCOUNTER — Other Ambulatory Visit: Payer: Self-pay | Admitting: Adult Health

## 2016-04-18 NOTE — Telephone Encounter (Signed)
Ok to refill for one year  

## 2016-05-06 ENCOUNTER — Other Ambulatory Visit: Payer: Self-pay | Admitting: Adult Health

## 2016-05-06 DIAGNOSIS — F17200 Nicotine dependence, unspecified, uncomplicated: Secondary | ICD-10-CM

## 2016-05-06 DIAGNOSIS — J4520 Mild intermittent asthma, uncomplicated: Secondary | ICD-10-CM

## 2016-05-06 NOTE — Telephone Encounter (Signed)
Ok to refill 

## 2016-05-16 ENCOUNTER — Encounter: Payer: Self-pay | Admitting: Acute Care

## 2016-05-23 ENCOUNTER — Telehealth: Payer: Self-pay | Admitting: Adult Health

## 2016-05-23 ENCOUNTER — Other Ambulatory Visit: Payer: Self-pay | Admitting: Pharmacist

## 2016-05-23 MED ORDER — CITALOPRAM HYDROBROMIDE 40 MG PO TABS: 40.0000 mg | ORAL_TABLET | Freq: Every day | ORAL | 1 refills | Status: DC

## 2016-05-23 NOTE — Telephone Encounter (Signed)
Please see message and advise 

## 2016-05-23 NOTE — Telephone Encounter (Signed)
Spoke to patient on the phone. There seemed to be some confusion during the last visit. He was taking Lexapro 20 mg. He was advised that if he felt like he needed to increase his dose he would let me know and I would call in 40 mg Celexa. He reports that he has just been taking 40 mg of Lexapro and now he is out   Will call in Celexa 40 mg

## 2016-05-23 NOTE — Patient Outreach (Signed)
Outreach call to [name] regarding his request for follow up from the [facility] Medication Adherence Campaign. Called and spoke with patient. HIPAA identifiers verified and verbal consent received.  Mr. Copping reports that he has been taking his metformin twice daily as directed. Reports that he rarely misses a dose. Denies any barriers to taking this medication such as cost or side effects.  However, reports that he has been having difficulty with obtaining his escitalopram. Reports that at his most recent office visit with his PCP, he was instructed to increase his escitalopram dose to 2 tablets (40 mg) daily. However, reports that his PCP did not send a new prescription to his pharmacy and that [facility] is now telling him that it is too soon for him to pick up a refill as the prescription direction has not been changed. Patient also mentions that he is in need of a refill of his diazepam.   PLAN:  Let patient know that I will call now to follow up with his PCP regarding the change in his escitalopram dose.  Harlow Asa, PharmD Clinical Pharmacist [facility] [phone]

## 2016-05-23 NOTE — Patient Outreach (Signed)
Call to patient's PCP to verify current escitalopram dose. Patient reports that at his most recent office visit with PCP, he was instructed to increase his escitalopram dose to 2 tablets (40 mg) daily. Patient reports that he is unable to get this prescription refilled [facility] due to prescription for new dose not having been called in to pharmacy.  Leave message with Constance Holster in [place] office. Note that maximum recommended dose of escitalopram is 20 mg daily. Express concern for risk of QTc prolongation and other adverse effects given this dose and patient's age.  Harlow Asa, PharmD Clinical Pharmacist [facility] [phone]

## 2016-05-23 NOTE — Telephone Encounter (Signed)
Per pt generic  lexapro was increase to 40 mg daily and in epic its show 20 mg. PLEASE clarify and per elizabeth 40 mg is not recommended dose for pt age etc. Pt will need new rx for 40 mg [facility]

## 2016-05-28 ENCOUNTER — Other Ambulatory Visit: Payer: Self-pay | Admitting: Pharmacist

## 2016-05-28 ENCOUNTER — Other Ambulatory Visit: Payer: Self-pay | Admitting: Adult Health

## 2016-05-28 NOTE — Patient Outreach (Signed)
Perform EPIC chart review. Per note from patient's PCP, [name], on 05/23/16:  "Spoke to patient on the phone. There seemed to be some confusion during the last visit. He was taking Lexapro 20 mg. He was advised that if he felt like he needed to increase his dose he would let me know and I would call in 40 mg Celexa. He reports that he has just been taking 40 mg of Lexapro and now he is out. Will call in Celexa 40 mg"  As medication management issue has been addressed, will close pharmacy episode at this time.  Harlow Asa, PharmD Clinical Pharmacist [facility] [phone]

## 2016-06-03 ENCOUNTER — Other Ambulatory Visit: Payer: Self-pay | Admitting: Adult Health

## 2016-06-03 DIAGNOSIS — J4520 Mild intermittent asthma, uncomplicated: Secondary | ICD-10-CM

## 2016-06-03 DIAGNOSIS — F17200 Nicotine dependence, unspecified, uncomplicated: Secondary | ICD-10-CM

## 2016-06-04 ENCOUNTER — Other Ambulatory Visit: Payer: Self-pay | Admitting: Adult Health

## 2016-06-04 NOTE — Telephone Encounter (Signed)
Diazepam called in as directed. Proair sent E-scribe.

## 2016-06-04 NOTE — Telephone Encounter (Signed)
Ok to refill 

## 2016-07-03 ENCOUNTER — Other Ambulatory Visit (INDEPENDENT_AMBULATORY_CARE_PROVIDER_SITE_OTHER): Payer: Medicare Other

## 2016-07-03 DIAGNOSIS — Z129 Encounter for screening for malignant neoplasm, site unspecified: Secondary | ICD-10-CM | POA: Diagnosis not present

## 2016-07-03 DIAGNOSIS — E119 Type 2 diabetes mellitus without complications: Secondary | ICD-10-CM

## 2016-07-03 DIAGNOSIS — E785 Hyperlipidemia, unspecified: Secondary | ICD-10-CM | POA: Diagnosis not present

## 2016-07-03 DIAGNOSIS — I10 Essential (primary) hypertension: Secondary | ICD-10-CM | POA: Diagnosis not present

## 2016-07-03 LAB — CBC WITH DIFFERENTIAL/PLATELET
BASOS PCT: 0.1 % (ref 0.0–3.0)
Basophils Absolute: 0 10*3/uL (ref 0.0–0.1)
EOS PCT: 0.7 % (ref 0.0–5.0)
Eosinophils Absolute: 0.1 10*3/uL (ref 0.0–0.7)
HCT: 43.3 % (ref 39.0–52.0)
Hemoglobin: 15 g/dL (ref 13.0–17.0)
LYMPHS ABS: 5.7 10*3/uL — AB (ref 0.7–4.0)
Lymphocytes Relative: 63.3 % — ABNORMAL HIGH (ref 12.0–46.0)
MCHC: 34.7 g/dL (ref 30.0–36.0)
MCV: 98.4 fl (ref 78.0–100.0)
MONO ABS: 0.6 10*3/uL (ref 0.1–1.0)
Monocytes Relative: 6.7 % (ref 3.0–12.0)
NEUTROS ABS: 2.6 10*3/uL (ref 1.4–7.7)
NEUTROS PCT: 29.2 % — AB (ref 43.0–77.0)
PLATELETS: 141 10*3/uL — AB (ref 150.0–400.0)
RBC: 4.4 Mil/uL (ref 4.22–5.81)
RDW: 13.5 % (ref 11.5–15.5)
WBC: 9 10*3/uL (ref 4.0–10.5)

## 2016-07-03 LAB — POC URINALSYSI DIPSTICK (AUTOMATED)
BILIRUBIN UA: NEGATIVE
Glucose, UA: NEGATIVE
Ketones, UA: NEGATIVE
Leukocytes, UA: NEGATIVE
NITRITE UA: NEGATIVE
PH UA: 5.5
PROTEIN UA: NEGATIVE
Spec Grav, UA: <=1.005
Urobilinogen, UA: 0.2

## 2016-07-03 LAB — HEPATIC FUNCTION PANEL
ALT: 16 U/L (ref 0–53)
AST: 16 U/L (ref 0–37)
Albumin: 4.4 g/dL (ref 3.5–5.2)
Alkaline Phosphatase: 48 U/L (ref 39–117)
BILIRUBIN TOTAL: 0.9 mg/dL (ref 0.2–1.2)
Bilirubin, Direct: 0.2 mg/dL (ref 0.0–0.3)
Total Protein: 6 g/dL (ref 6.0–8.3)

## 2016-07-03 LAB — BASIC METABOLIC PANEL
BUN: 17 mg/dL (ref 6–23)
CHLORIDE: 100 meq/L (ref 96–112)
CO2: 33 mEq/L — ABNORMAL HIGH (ref 19–32)
Calcium: 9.6 mg/dL (ref 8.4–10.5)
Creatinine, Ser: 1.08 mg/dL (ref 0.40–1.50)
GFR: 71.81 mL/min (ref >60.00)
GLUCOSE: 132 mg/dL — AB (ref 70–99)
POTASSIUM: 3.6 meq/L (ref 3.5–5.1)
Sodium: 143 mEq/L (ref 135–145)

## 2016-07-03 LAB — LIPID PANEL
CHOLESTEROL: 119 mg/dL (ref 0–200)
HDL: 43.4 mg/dL (ref >39.00)
LDL CALC: 41 mg/dL (ref 0–99)
NonHDL: 75.4
TRIGLYCERIDES: 171 mg/dL — AB (ref 0.0–149.0)
Total CHOL/HDL Ratio: 3
VLDL: 34.2 mg/dL (ref 0.0–40.0)

## 2016-07-03 LAB — MICROALBUMIN / CREATININE URINE RATIO
CREATININE, U: 78.8 mg/dL
MICROALB UR: 3.6 mg/dL — AB (ref 0.0–1.9)
Microalb Creat Ratio: 4.6 mg/g (ref 0.0–30.0)

## 2016-07-03 LAB — PSA: PSA: 0.9 ng/mL (ref 0.10–4.00)

## 2016-07-03 LAB — HEMOGLOBIN A1C: Hgb A1c MFr Bld: 6.8 % — ABNORMAL HIGH (ref 4.6–6.5)

## 2016-07-03 LAB — TSH: TSH: 0.82 u[IU]/mL (ref 0.35–4.50)

## 2016-07-10 ENCOUNTER — Encounter: Payer: Medicare Other | Admitting: Adult Health

## 2016-07-16 ENCOUNTER — Encounter: Payer: Medicare Other | Admitting: Adult Health

## 2016-07-18 ENCOUNTER — Encounter: Payer: Self-pay | Admitting: Adult Health

## 2016-07-18 ENCOUNTER — Ambulatory Visit (INDEPENDENT_AMBULATORY_CARE_PROVIDER_SITE_OTHER): Payer: Medicare Other | Admitting: Adult Health

## 2016-07-18 VITALS — BP 106/64 | Temp 98.6°F | Ht 74.0 in | Wt 241.5 lb

## 2016-07-18 DIAGNOSIS — F17200 Nicotine dependence, unspecified, uncomplicated: Secondary | ICD-10-CM

## 2016-07-18 DIAGNOSIS — E1121 Type 2 diabetes mellitus with diabetic nephropathy: Secondary | ICD-10-CM | POA: Diagnosis not present

## 2016-07-18 DIAGNOSIS — Z0000 Encounter for general adult medical examination without abnormal findings: Secondary | ICD-10-CM | POA: Diagnosis not present

## 2016-07-18 DIAGNOSIS — I10 Essential (primary) hypertension: Secondary | ICD-10-CM | POA: Diagnosis not present

## 2016-07-18 DIAGNOSIS — F418 Other specified anxiety disorders: Secondary | ICD-10-CM

## 2016-07-18 DIAGNOSIS — J449 Chronic obstructive pulmonary disease, unspecified: Secondary | ICD-10-CM

## 2016-07-18 DIAGNOSIS — R4189 Other symptoms and signs involving cognitive functions and awareness: Secondary | ICD-10-CM

## 2016-07-18 MED ORDER — ALBUTEROL SULFATE HFA 108 (90 BASE) MCG/ACT IN AERS: INHALATION_SPRAY | RESPIRATORY_TRACT | 3 refills | Status: DC

## 2016-07-18 NOTE — Progress Notes (Signed)
Subjective:    Patient ID: Drew Burke, male    DOB: [birth year], 71 y.o.   MRN: [id number]  HPI  Patient presents for yearly preventative medicine examination. He is a pleasant 71 year old male who  has a past medical history of Allergic rhinitis; Allergy; Aneurysm ([place]); Anxiety; COPD (chronic obstructive pulmonary disease) ([place]); Depression; DM type 2 (diabetes mellitus, type 2) ([place]); Hyperlipidemia; Hypertension; and Tobacco abuse.  All immunizations and health maintenance protocols were reviewed with the patient and needed orders were placed.  Medication reconciliation,  past medical history, social history, problem list and allergies were reviewed in detail with the patient  Goals were established with regard to weight loss, exercise, and  diet in compliance with medications. He reports that his life has become sedentary due to issues with balance. He does report falling one time the last 3 months following his carotid. He reports that he tripped and fell denies any injuries or hitting his head area  Wt Readings from Last 3 Encounters:  07/18/16 241 lb 8 oz (109.5 kg)  04/09/16 239 lb 8 oz (108.6 kg)  03/05/16 229 lb (103.9 kg)   End of life planning was discussed. He has a living will and advanced directive.   He is up-to-date on colonoscopy as well as dental care. He has not been to the eye doctor yet this year.  During the exam today he reports as though he feels like he is getting "older". When asked about this she states that he is often forgetting to pay his bills on time and feels as though he is forgetting friend names and phone numbers that he once knew. He denies any history of negative impairment in his family.   Feels as though her depression and anxiety are well-controlled  He does not check his blood sugars at home  He continues to smoke and does not want to quit at this time.    Review of Systems  Constitutional: Positive for fatigue.  HENT: Negative.    Eyes: Negative.   Respiratory: Negative.   Cardiovascular: Negative.   Gastrointestinal: Negative.   Endocrine: Negative.   Genitourinary: Negative.   Musculoskeletal: Positive for arthralgias, gait problem and myalgias.  Skin: Negative.   Allergic/Immunologic: Negative.   Hematological: Negative.   Psychiatric/Behavioral: Positive for decreased concentration. Negative for agitation, behavioral problems, confusion, self-injury, sleep disturbance and suicidal ideas.  All other systems reviewed and are negative.  Past Medical History:  Diagnosis Date  . Allergic rhinitis   . Allergy    pt denies  . Aneurysm ([place])   . Anxiety   . COPD (chronic obstructive pulmonary disease) ([place])   . Depression   . DM type 2 (diabetes mellitus, type 2) ([place])   . Hyperlipidemia   . Hypertension   . Tobacco abuse     Social History   Social History  . Marital status: Divorced    Spouse name: N/A  . Number of children: N/A  . 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 Smoking status: Current Every Day Smoker    Packs/day: 1.00    Years: 54.00    Types: Cigarettes    Start date: 07/07/1960  . Smokeless tobacco: Never Used     Comment: used to smoke 3 ppd - currently smoking 1 ppd - started smoking at age 19.  Marland Kitchen Alcohol use 16.8 oz/week    28 Shots of liquor  per week     Comment: Patient thinks he self medicates with alcohol for back pain. Does not drink if taking hydrocodone- 2 scothches a day   . Drug use: No  . Sexual activity: Not on file   Other Topics Concern  . Not on file   Social History Narrative   Retired - Diplomatic Services operational officer by himself   Has a dog       Past Surgical History:  Procedure Laterality Date  . COLONOSCOPY    . corneal transplant surgery left eye    . FOOT SURGERY     hammer toe and bone spur  . LUMBAR DISC SURGERY    . LUMBAR LAMINECTOMY      Family History  Problem Relation Age of  Onset  . Depression Other   . Hypertension Other   . Heart disease Father   . Alcohol abuse Father   . Cancer Mother     intestines-duodenal cancer vs stomach per pt.  . Stomach cancer Mother   . Colon cancer Neg Hx   . Colon polyps Neg Hx   . Esophageal cancer Neg Hx   . Rectal cancer Neg Hx     No Known Allergies  Current Outpatient Prescriptions on File Prior to Visit  Medication Sig Dispense Refill  . albuterol (PROAIR HFA) 108 (90 BASE) MCG/ACT inhaler INHALE 2 PUFFS INTO THE LUNGS EVERY 4 (FOUR) HOURS AS NEEDED FOR WHEEZING. 18 g 6  . aspirin 81 MG tablet Take 81 mg by mouth daily.      . citalopram (CELEXA) 40 MG tablet Take 1 tablet (40 mg total) by mouth daily. 90 tablet 1  . Cyanocobalamin (VITAMIN B 12 PO) Take 2,000 mcg by mouth daily.    . diazepam (VALIUM) 5 MG tablet TAKE 1 TABLET BY MOUTH 3 TIMES A DAY 90 tablet 0  . losartan-hydrochlorothiazide (HYZAAR) 100-25 MG tablet TAKE 1 TABLET BY MOUTH EVERY DAY 100 tablet 1  . losartan-hydrochlorothiazide (HYZAAR) 100-25 MG tablet TAKE 1 TABLET BY MOUTH EVERY DAY 100 tablet 2  . metFORMIN (GLUCOPHAGE) 1000 MG tablet TAKE 1 TABLET BY MOUTH TWICE A DAY 200 tablet 1  . ONETOUCH DELICA LANCETS FINE MISC Test twice daily. 100 each 5  . ONETOUCH VERIO test strip CHECK TWICE DAILY. 100 each 3  . potassium chloride (K-DUR) 10 MEQ tablet Take 1 tablet (10 mEq total) by mouth daily. 90 tablet 1  . PROAIR HFA 108 (90 Base) MCG/ACT inhaler INHALE 2 PUFFS INTO THE LUNGS EVERY 4 HOURS AS NEEDED FOR WHEEZING 8.5 Inhaler 0  . Probiotic Product (ALIGN PO) Take 1 capsule by mouth daily.    . simvastatin (ZOCOR) 40 MG tablet TAKE 1 TABLET BY MOUTH AT BEDTIME 90 tablet 0  . triamcinolone cream (KENALOG) 0.5 % APPLY TOPICALLY TWICE A DAY 30 g 0   Current Facility-Administered Medications on File Prior to Visit  Medication Dose Route Frequency Provider Last Rate Last Dose  . 0.9 %  sodium chloride infusion  500 mL Intravenous Continuous Manus Gunning, MD        BP 106/64   Temp 98.6 F (37 C) (Oral)   Ht 6\' 2"  (1.88 m)   Wt 241 lb 8 oz (109.5 kg)   BMI 31.01 kg/m       Objective:   Physical Exam  Constitutional: He is oriented to person, place, and time. He appears well-developed and well-nourished. No distress.  HENT:  Head: Normocephalic and atraumatic.  Right Ear: External ear normal.  Left Ear: External ear normal.  Nose: Nose normal.  Mouth/Throat: Oropharynx is clear and moist.  Eyes: Conjunctivae and EOM are normal. Pupils are equal, round, and reactive to light. Right eye exhibits no discharge. Left eye exhibits no discharge. No scleral icterus.  Neck: Normal range of motion. Neck supple. No JVD present. No tracheal deviation present. No thyromegaly present.  Cardiovascular: Normal rate, regular rhythm, normal heart sounds and intact distal pulses.  Exam reveals no gallop and no friction rub.   No murmur heard. Pulmonary/Chest: Effort normal and breath sounds normal. No stridor. No respiratory distress. He has no wheezes. He has no rales. He exhibits no tenderness.  Abdominal: Soft. Bowel sounds are normal. He exhibits no distension and no mass. There is no tenderness. There is no rebound and no guarding.  Genitourinary: Rectum normal and prostate normal. Rectal exam shows guaiac negative stool.  Musculoskeletal: He exhibits no edema, tenderness or deformity.  Walks with a slow shuffling gait.   Lymphadenopathy:    He has no cervical adenopathy.  Neurological: He is alert and oriented to person, place, and time. He has normal reflexes. He displays normal reflexes. No cranial nerve deficit. Coordination normal.  Skin: Skin is warm and dry. No rash noted. He is not diaphoretic. No erythema. No pallor.  Psychiatric: He has a normal mood and affect. His behavior is normal. Judgment and thought content normal.  Nursing note and vitals reviewed.      Assessment & Plan:  1. Routine general medical  examination at a health care facility - Reviewed labs in detail with patient and all questions answered.  - Educated on the importance of diet and exercise as well as quitting smoking  2. Essential hypertension - at goal - continue to monitor   3. Controlled type 2 diabetes mellitus with diabetic nephropathy, without long-term current use of insulin (HCC) - A1c has increased from 6.4 to 6.8 - related to poor diet and fatigue.  - Follow up in 3 months  4. TOBACCO ABUSE - Does not want to quit. Advised to cut back and let me know if he ever wants help quitting   5. Depression with anxiety - Controlled on current therapy   6. Cognitive impairment - Refused further testing at this time. I would like to send him for neuropsych testing. He will let me know if he wants to do this - Will follow up with this at his next visit for diabetes check.   7. COPD with chronic bronchitis (HCC)  - albuterol (PROAIR HFA) 108 (90 Base) MCG/ACT inhaler; INHALE 2 PUFFS INTO THE LUNGS EVERY 4 (FOUR) HOURS AS NEEDED FOR WHEEZING.  Dispense: 18 g; Refill: 3   [name], NP

## 2016-07-18 NOTE — Patient Instructions (Addendum)
As always it was great seeing you today  Your A1c is up slightly this is due to diet and inactivity. The rest of your labs look pretty good.   I would like you to start using a cane.   Follow up with your eye doctor   Please follow up with me in three months or sooner if needed

## 2016-08-18 ENCOUNTER — Other Ambulatory Visit: Payer: Self-pay | Admitting: Adult Health

## 2016-08-18 NOTE — Telephone Encounter (Signed)
Ok to refill for one month  

## 2016-08-19 NOTE — Telephone Encounter (Signed)
Rx called in as directed.   

## 2016-08-24 ENCOUNTER — Other Ambulatory Visit: Payer: Self-pay | Admitting: Adult Health

## 2016-08-25 ENCOUNTER — Other Ambulatory Visit: Payer: Self-pay | Admitting: Adult Health

## 2016-08-25 NOTE — Telephone Encounter (Signed)
Ok to refill for one month  

## 2016-08-26 NOTE — Telephone Encounter (Signed)
Rx called in as directed.   

## 2016-09-04 ENCOUNTER — Other Ambulatory Visit: Payer: Self-pay | Admitting: Adult Health

## 2016-09-04 NOTE — Telephone Encounter (Signed)
Ok to refill 

## 2016-09-11 ENCOUNTER — Other Ambulatory Visit: Payer: Self-pay | Admitting: Adult Health

## 2016-09-14 ENCOUNTER — Other Ambulatory Visit: Payer: Self-pay | Admitting: Adult Health

## 2016-09-27 ENCOUNTER — Other Ambulatory Visit: Payer: Self-pay | Admitting: Adult Health

## 2016-10-14 ENCOUNTER — Telehealth: Payer: Self-pay

## 2016-10-14 ENCOUNTER — Other Ambulatory Visit: Payer: Self-pay | Admitting: Adult Health

## 2016-10-14 DIAGNOSIS — J449 Chronic obstructive pulmonary disease, unspecified: Secondary | ICD-10-CM

## 2016-10-14 NOTE — Telephone Encounter (Signed)
OK for one refill.   It was last refilled in January. He should not be going through this inhaler that quickly. Please have him come in for evaluation

## 2016-10-14 NOTE — Telephone Encounter (Signed)
Ok to refill 

## 2016-10-14 NOTE — Telephone Encounter (Signed)
Per Tommi Rumps, he would like to see patient for a follow up. Would you mind scheduling a 30 min visit for patient please? Thank you!

## 2016-10-15 NOTE — Telephone Encounter (Signed)
Pt has been scheduled.  °

## 2016-10-16 ENCOUNTER — Ambulatory Visit: Payer: Medicare Other | Admitting: Adult Health

## 2016-11-13 ENCOUNTER — Ambulatory Visit: Payer: Medicare Other | Admitting: Adult Health

## 2016-11-28 ENCOUNTER — Ambulatory Visit: Payer: Medicare Other | Admitting: Adult Health

## 2016-11-28 ENCOUNTER — Other Ambulatory Visit: Payer: Self-pay | Admitting: Adult Health

## 2016-11-28 NOTE — Telephone Encounter (Signed)
Ok to fill for one year  

## 2016-12-07 ENCOUNTER — Other Ambulatory Visit: Payer: Self-pay | Admitting: Adult Health

## 2016-12-07 DIAGNOSIS — J449 Chronic obstructive pulmonary disease, unspecified: Secondary | ICD-10-CM

## 2016-12-12 ENCOUNTER — Ambulatory Visit: Payer: Medicare Other | Admitting: Adult Health

## 2016-12-26 ENCOUNTER — Ambulatory Visit: Payer: Medicare Other | Admitting: Adult Health

## 2017-01-09 ENCOUNTER — Other Ambulatory Visit: Payer: Self-pay | Admitting: Adult Health

## 2017-01-09 ENCOUNTER — Ambulatory Visit: Payer: Medicare Other | Admitting: Adult Health

## 2017-01-09 DIAGNOSIS — J449 Chronic obstructive pulmonary disease, unspecified: Secondary | ICD-10-CM

## 2017-01-09 NOTE — Telephone Encounter (Signed)
Ok to refill 

## 2017-01-23 ENCOUNTER — Ambulatory Visit: Payer: Medicare Other | Admitting: Adult Health

## 2017-02-06 ENCOUNTER — Ambulatory Visit: Payer: Medicare Other | Admitting: Adult Health

## 2017-02-13 ENCOUNTER — Other Ambulatory Visit: Payer: Self-pay | Admitting: Adult Health

## 2017-02-17 NOTE — Telephone Encounter (Signed)
Called to the pharmacy and left on voicemail. 

## 2017-02-17 NOTE — Telephone Encounter (Signed)
Ok to refill for 30 days  

## 2017-02-17 NOTE — Telephone Encounter (Signed)
Last filled on 08/26/16 #90 Last seen on 07/18/16 for yearly Appt on 02/20/17 for follow up Please advise.  Thanks!!

## 2017-02-20 ENCOUNTER — Ambulatory Visit: Payer: Medicare Other | Admitting: Adult Health

## 2017-02-25 ENCOUNTER — Telehealth: Payer: Self-pay | Admitting: Family Medicine

## 2017-02-25 ENCOUNTER — Other Ambulatory Visit: Payer: Self-pay | Admitting: Adult Health

## 2017-02-25 DIAGNOSIS — J449 Chronic obstructive pulmonary disease, unspecified: Secondary | ICD-10-CM

## 2017-02-25 NOTE — Telephone Encounter (Signed)
Pt has been scheduled 03/11/17, 30 min OV

## 2017-02-25 NOTE — Telephone Encounter (Signed)
Pt is due for diabetes check. Please have him come in and see [facility].  Will do A1C at time of visit.  Thanks!!

## 2017-02-25 NOTE — Telephone Encounter (Signed)
Pt seems to be using rescue inhaler regularly.  Please advise.

## 2017-02-26 NOTE — Telephone Encounter (Signed)
Sent to the pharmacy by e-scribe. 

## 2017-02-26 NOTE — Telephone Encounter (Signed)
Ok to refill. We will talk about this at his appointment in a couple of weeks

## 2017-02-26 NOTE — Telephone Encounter (Signed)
Sent to the pharmacy by e-scribe for 90 days.  Pt has upcoming cpx on 03/11/17.

## 2017-03-06 ENCOUNTER — Other Ambulatory Visit: Payer: Self-pay | Admitting: Adult Health

## 2017-03-06 NOTE — Telephone Encounter (Signed)
Sent to the pharmacy by e-scribe. 

## 2017-03-10 ENCOUNTER — Ambulatory Visit: Payer: Medicare Other | Admitting: Adult Health

## 2017-03-11 ENCOUNTER — Ambulatory Visit: Payer: Medicare Other | Admitting: Adult Health

## 2017-03-26 ENCOUNTER — Ambulatory Visit: Payer: Medicare Other | Admitting: Adult Health

## 2017-04-01 ENCOUNTER — Telehealth: Payer: Self-pay | Admitting: Adult Health

## 2017-04-01 DIAGNOSIS — J449 Chronic obstructive pulmonary disease, unspecified: Secondary | ICD-10-CM

## 2017-04-01 NOTE — Telephone Encounter (Signed)
Reviewed refill hx of albuterol.  Pt seems to be using a lot. Please review and advise.  Thanks!! Has upcoming appt on 04/09/17

## 2017-04-02 NOTE — Telephone Encounter (Signed)
1 inhaler sent to the pharmacy until seen and discussed in [place].

## 2017-04-02 NOTE — Telephone Encounter (Signed)
Please have him come and be evaluated. He will need a maintenance inhaler   Ok to refill for 30 days

## 2017-04-02 NOTE — Telephone Encounter (Signed)
Pt states he really needs the inhaler asap.   Aware rx will be sent in, and that he really needs to keep the appointment on 10/04 for evaluation.  [id number] - [address], [place] .

## 2017-04-09 ENCOUNTER — Ambulatory Visit (INDEPENDENT_AMBULATORY_CARE_PROVIDER_SITE_OTHER): Payer: Medicare Other | Admitting: Adult Health

## 2017-04-09 ENCOUNTER — Encounter: Payer: Self-pay | Admitting: Adult Health

## 2017-04-09 VITALS — BP 114/60 | Temp 98.0°F | Ht 70.0 in | Wt 250.0 lb

## 2017-04-09 DIAGNOSIS — J42 Unspecified chronic bronchitis: Secondary | ICD-10-CM | POA: Diagnosis not present

## 2017-04-09 DIAGNOSIS — Z23 Encounter for immunization: Secondary | ICD-10-CM | POA: Diagnosis not present

## 2017-04-09 DIAGNOSIS — R269 Unspecified abnormalities of gait and mobility: Secondary | ICD-10-CM | POA: Diagnosis not present

## 2017-04-09 DIAGNOSIS — E1121 Type 2 diabetes mellitus with diabetic nephropathy: Secondary | ICD-10-CM | POA: Diagnosis not present

## 2017-04-09 LAB — POCT GLYCOSYLATED HEMOGLOBIN (HGB A1C): HEMOGLOBIN A1C: 6.7

## 2017-04-09 MED ORDER — FREESTYLE LIBRE SENSOR SYSTEM MISC: 0 refills | Status: DC

## 2017-04-09 MED ORDER — FLUTICASONE FUROATE-VILANTEROL 100-25 MCG/INH IN AEPB: 1.0000 | INHALATION_SPRAY | Freq: Every day | RESPIRATORY_TRACT | 11 refills | Status: DC

## 2017-04-09 MED ORDER — FREESTYLE LIBRE READER DEVI: 1.0000 | Freq: Every day | 0 refills | Status: DC

## 2017-04-09 NOTE — Progress Notes (Signed)
Subjective:    Patient ID: Drew Burke, male    DOB: [birth year], 71 y.o.   MRN: [id number]  HPI  71 year old male who  has a past medical history of Allergic rhinitis; Allergy; Aneurysm ([place]); Anxiety; COPD (chronic obstructive pulmonary disease) ([place]); Depression; DM type 2 (diabetes mellitus, type 2) ([place]); Hyperlipidemia; Hypertension; and Tobacco abuse.   He presents to the office today for follow up of diabetes. His last A1c was 9 months ago with a reading of 6.8. He is managed on Metformin 1000 mg  BID. He does not check his blood sugars on a daily basis.   There is also concern that he is using his rescue inhaler to often. He is often running out of his inhaler on a monthly basis. He has a history of COPD.   He also reports gait imbalance. This has been an issue for multiple years and has been seen by PT in the past. He noticed improvement when going to PT.he would like to go to PT. He does report multiple falls over the course of the last six months. Reports no injury. He is not using a cane or walker   Review of Systems See HPI   Past Medical History:  Diagnosis Date  . Allergic rhinitis   . Allergy    pt denies  . Aneurysm ([place])   . Anxiety   . COPD (chronic obstructive pulmonary disease) ([place])   . Depression   . DM type 2 (diabetes mellitus, type 2) ([place])   . Hyperlipidemia   . Hypertension   . Tobacco abuse     Social History   Social History  . Marital status: Divorced    Spouse name: N/A  . Number of children: N/A  . 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 Smoking status: Current Every Day Smoker    Packs/day: 1.00    Years: 54.00    Types: Cigarettes    Start date: 07/07/1960  . Smokeless tobacco: Never Used     Comment: used to smoke 3 ppd - currently smoking 1 ppd - started smoking at age 82.  Marland Kitchen Alcohol use 16.8 oz/week    28 Shots of liquor per week     Comment: Patient  thinks he self medicates with alcohol for back pain. Does not drink if taking hydrocodone- 2 scothches a day   . Drug use: No  . Sexual activity: Not on file   Other Topics Concern  . Not on file   Social History Narrative   Retired - Diplomatic Services operational officer by himself   Has a dog       Past Surgical History:  Procedure Laterality Date  . COLONOSCOPY    . corneal transplant surgery left eye    . FOOT SURGERY     hammer toe and bone spur  . LUMBAR DISC SURGERY    . LUMBAR LAMINECTOMY      Family History  Problem Relation Age of Onset  . Depression Other   . Hypertension Other   . Heart disease Father   . Alcohol abuse Father   . Cancer Mother        intestines-duodenal cancer vs stomach per pt.  . Stomach cancer Mother   . Colon cancer Neg Hx   . Colon polyps Neg Hx   . Esophageal cancer Neg Hx   . Rectal cancer Neg Hx  No Known Allergies  Current Outpatient Prescriptions on File Prior to Visit  Medication Sig Dispense Refill  . albuterol (PROVENTIL HFA;VENTOLIN HFA) 108 (90 Base) MCG/ACT inhaler TAKE 2 PUFFS BY MOUTH EVERY 4 HOURS AS NEEDED FOR WHEEZE 8.5 Inhaler 0  . aspirin 81 MG tablet Take 81 mg by mouth daily.      . citalopram (CELEXA) 40 MG tablet Take 1 tablet (40 mg total) by mouth daily. 90 tablet 1  . Cyanocobalamin (VITAMIN B 12 PO) Take 2,000 mcg by mouth daily.    . diazepam (VALIUM) 5 MG tablet TAKE 1 TABLET BY MOUTH 3 TIMES A DAY 90 tablet 0  . escitalopram (LEXAPRO) 20 MG tablet TAKE 1 TABLET BY MOUTH EVERY DAY 90 tablet 0  . KLOR-CON 10 10 MEQ tablet TAKE 1 TABLET BY MOUTH EVERY DAY 90 tablet 0  . losartan-hydrochlorothiazide (HYZAAR) 100-25 MG tablet TAKE 1 TABLET BY MOUTH EVERY DAY 90 tablet 0  . metFORMIN (GLUCOPHAGE) 1000 MG tablet TAKE 1 TABLET BY MOUTH TWICE A DAY 180 tablet 0  . ONETOUCH DELICA LANCETS FINE MISC Test twice daily. 100 each 5  . ONETOUCH VERIO test strip CHECK TWICE DAILY. 100 each 3  . PROAIR HFA 108 (90 Base) MCG/ACT inhaler  INHALE 2 PUFFS INTO THE LUNGS EVERY 4 HOURS AS NEEDED FOR WHEEZING 8.5 Inhaler 0  . Probiotic Product (ALIGN PO) Take 1 capsule by mouth daily.    . simvastatin (ZOCOR) 40 MG tablet TAKE 1 TABLET BY MOUTH AT BEDTIME 90 tablet 3  . triamcinolone cream (KENALOG) 0.5 % APPLY TOPICALLY TWICE A DAY 30 g 0   Current Facility-Administered Medications on File Prior to Visit  Medication Dose Route Frequency Provider Last Rate Last Dose  . 0.9 %  sodium chloride infusion  500 mL Intravenous Continuous Armbruster, Carlota Raspberry, MD        BP 114/60 (BP Location: Left Arm)   Temp 98 F (36.7 C) (Oral)   Ht 5\' 10"  (1.778 m)   Wt 250 lb (113.4 kg)   BMI 35.87 kg/m       Objective:   Physical Exam  Constitutional: He is oriented to person, place, and time. He appears well-developed and well-nourished. No distress.  Cardiovascular: Normal rate, regular rhythm, normal heart sounds and intact distal pulses.  Exam reveals no gallop and no friction rub.   No murmur heard. Pulmonary/Chest: Effort normal and breath sounds normal. No respiratory distress. He has no wheezes. He has no rales. He exhibits no tenderness.  Musculoskeletal:  Limping gait    Neurological: He is alert and oriented to person, place, and time.  Skin: Skin is warm and dry. No rash noted. He is not diaphoretic. No erythema. No pallor.  Psychiatric: He has a normal mood and affect. His behavior is normal. Judgment and thought content normal.  Nursing note and vitals reviewed.     Assessment & Plan:  1. Need for prophylactic vaccination and inoculation against influenza  - Flu vaccine HIGH DOSE PF (Fluzone High dose)  2. Controlled type 2 diabetes mellitus with diabetic nephropathy, without long-term current use of insulin (HCC)  - POCT A1C - 6.7. Stable  - Continuous Blood Gluc Receiver (FREESTYLE LIBRE READER) DEVI; 1 Device by Does not apply route daily. USE TO TEST BLOOD GLUCOSE ONCE DAILY  Dispense: 1 Device; Refill: 0 -  Continuous Blood Gluc Sensor (FREESTYLE LIBRE SENSOR SYSTEM) MISC; USE TO TEST BLOOD GLUCOSE ONCE DAILY  Dispense: 1 each; Refill: 0  3. Gait disturbance  - Ambulatory referral to Physical Therapy  4. Chronic bronchitis, unspecified chronic bronchitis type ([place]) - Advised that I do not want him using rescue inhaler daily. Will prescribe Breo  - Encouraged to quit smoking  - fluticasone furoate-vilanterol (BREO ELLIPTA) 100-25 MCG/INH AEPB; Inhale 1 puff into the lungs daily.  Dispense: 28 each; Refill: [address] AGNP

## 2017-04-28 ENCOUNTER — Other Ambulatory Visit: Payer: Self-pay | Admitting: Adult Health

## 2017-04-29 ENCOUNTER — Other Ambulatory Visit: Payer: Self-pay | Admitting: Adult Health

## 2017-04-29 DIAGNOSIS — J4520 Mild intermittent asthma, uncomplicated: Secondary | ICD-10-CM

## 2017-04-29 DIAGNOSIS — F17200 Nicotine dependence, unspecified, uncomplicated: Secondary | ICD-10-CM

## 2017-04-29 NOTE — Telephone Encounter (Signed)
Ok to refill + 1  

## 2017-04-29 NOTE — Telephone Encounter (Signed)
Sent to the pharmacy by e-scribe. 

## 2017-04-29 NOTE — Telephone Encounter (Signed)
Recent discussion of excessive use of rescue inhaler.  Please advise.

## 2017-04-29 NOTE — Telephone Encounter (Signed)
Did he pick up his Breo inhaler?    He should not be going through his albuterol that quickly.

## 2017-04-30 NOTE — Telephone Encounter (Signed)
Left a message for a return call.

## 2017-05-01 NOTE — Telephone Encounter (Signed)
Spoke to the pt.  He has not had any albuterol inhaler since starting the Breo.  However, there has been a few times that he wishes he had some.  Advised that I can send in 1 albuterol inhaler but only need to use it PRN.  Continue to use Breo daily.  Pt agreed.

## 2017-05-10 ENCOUNTER — Other Ambulatory Visit: Payer: Self-pay | Admitting: Adult Health

## 2017-05-11 NOTE — Telephone Encounter (Signed)
Ok to refill for 30 days  

## 2017-05-13 NOTE — Telephone Encounter (Signed)
Called to the pharmacy and left on machine. 

## 2017-06-06 ENCOUNTER — Other Ambulatory Visit: Payer: Self-pay | Admitting: Adult Health

## 2017-06-09 ENCOUNTER — Other Ambulatory Visit: Payer: Self-pay | Admitting: Adult Health

## 2017-06-09 NOTE — Telephone Encounter (Signed)
Sent to the pharmacy by e-scribe 07/22/16.  Pt has upcoming cpx on 07/22/17

## 2017-06-10 NOTE — Telephone Encounter (Signed)
Sent to the pharmacy by e-scribe for 90 days.  Pt is scheduled for cpx on 07/22/17.

## 2017-07-17 ENCOUNTER — Other Ambulatory Visit: Payer: Self-pay | Admitting: Adult Health

## 2017-07-21 ENCOUNTER — Telehealth: Payer: Self-pay | Admitting: Adult Health

## 2017-07-21 NOTE — Telephone Encounter (Signed)
Copied from [place] [id number]. Topic: Quick Communication - See Telephone Encounter >> Jul 21, 2017  9:58 AM Burnis Medin, NT wrote: CRM for notification. See Telephone encounter for: Drew Burke is calling to because he sent several request for diazepam (VALIUM) 5 MG tablet and hasn't heard back. Pls Advise.  07/21/17.

## 2017-07-21 NOTE — Telephone Encounter (Signed)
Last filled 05/13/17 for #90 Please advise.

## 2017-07-21 NOTE — Telephone Encounter (Signed)
LR: 05/13/17 #90 tabs  0 refills Routed back to provider

## 2017-07-21 NOTE — Telephone Encounter (Signed)
Duplicate requests.  See Medication refill request.

## 2017-07-21 NOTE — Telephone Encounter (Signed)
Called to the pharmacy and left on machine. 

## 2017-07-21 NOTE — Telephone Encounter (Signed)
Ok to refill for 30 days  

## 2017-07-22 ENCOUNTER — Other Ambulatory Visit: Payer: Self-pay

## 2017-07-22 ENCOUNTER — Emergency Department (HOSPITAL_COMMUNITY): Payer: Medicare Other

## 2017-07-22 ENCOUNTER — Emergency Department (HOSPITAL_COMMUNITY)
Admission: EM | Admit: 2017-07-22 | Discharge: 2017-07-22 | Disposition: A | Discharge status: Home / Self Care | Payer: Medicare Other | Attending: Emergency Medicine | Admitting: Emergency Medicine

## 2017-07-22 ENCOUNTER — Encounter: Payer: Medicare Other | Admitting: Adult Health

## 2017-07-22 ENCOUNTER — Encounter (HOSPITAL_COMMUNITY): Payer: Self-pay

## 2017-07-22 DIAGNOSIS — Z7982 Long term (current) use of aspirin: Secondary | ICD-10-CM | POA: Insufficient documentation

## 2017-07-22 DIAGNOSIS — F17210 Nicotine dependence, cigarettes, uncomplicated: Secondary | ICD-10-CM | POA: Diagnosis not present

## 2017-07-22 DIAGNOSIS — J449 Chronic obstructive pulmonary disease, unspecified: Secondary | ICD-10-CM | POA: Insufficient documentation

## 2017-07-22 DIAGNOSIS — W19XXXA Unspecified fall, initial encounter: Secondary | ICD-10-CM | POA: Insufficient documentation

## 2017-07-22 DIAGNOSIS — I10 Essential (primary) hypertension: Secondary | ICD-10-CM | POA: Insufficient documentation

## 2017-07-22 DIAGNOSIS — Y929 Unspecified place or not applicable: Secondary | ICD-10-CM | POA: Insufficient documentation

## 2017-07-22 DIAGNOSIS — E119 Type 2 diabetes mellitus without complications: Secondary | ICD-10-CM | POA: Insufficient documentation

## 2017-07-22 DIAGNOSIS — R2689 Other abnormalities of gait and mobility: Secondary | ICD-10-CM | POA: Insufficient documentation

## 2017-07-22 DIAGNOSIS — Z79899 Other long term (current) drug therapy: Secondary | ICD-10-CM | POA: Diagnosis not present

## 2017-07-22 DIAGNOSIS — S4991XA Unspecified injury of right shoulder and upper arm, initial encounter: Secondary | ICD-10-CM | POA: Diagnosis present

## 2017-07-22 DIAGNOSIS — Y999 Unspecified external cause status: Secondary | ICD-10-CM | POA: Diagnosis not present

## 2017-07-22 DIAGNOSIS — Y9389 Activity, other specified: Secondary | ICD-10-CM | POA: Insufficient documentation

## 2017-07-22 DIAGNOSIS — I2510 Atherosclerotic heart disease of native coronary artery without angina pectoris: Secondary | ICD-10-CM | POA: Diagnosis not present

## 2017-07-22 DIAGNOSIS — M6282 Rhabdomyolysis: Secondary | ICD-10-CM

## 2017-07-22 DIAGNOSIS — Y92009 Unspecified place in unspecified non-institutional (private) residence as the place of occurrence of the external cause: Secondary | ICD-10-CM

## 2017-07-22 LAB — COMPREHENSIVE METABOLIC PANEL
ALBUMIN: 3.9 g/dL (ref 3.5–5.0)
ALT: 23 U/L (ref 17–63)
ANION GAP: 9 (ref 5–15)
AST: 31 U/L (ref 15–41)
Alkaline Phosphatase: 51 U/L (ref 38–126)
BUN: 14 mg/dL (ref 6–20)
CO2: 25 mmol/L (ref 22–32)
Calcium: 8.8 mg/dL — ABNORMAL LOW (ref 8.9–10.3)
Chloride: 101 mmol/L (ref 101–111)
Creatinine, Ser: 0.94 mg/dL (ref 0.61–1.24)
GFR calc Af Amer: >60 mL/min (ref >60)
GFR calc non Af Amer: >60 mL/min (ref >60)
GLUCOSE: 149 mg/dL — AB (ref 65–99)
POTASSIUM: 3.4 mmol/L — AB (ref 3.5–5.1)
SODIUM: 135 mmol/L (ref 135–145)
Total Bilirubin: 1.4 mg/dL — ABNORMAL HIGH (ref 0.3–1.2)
Total Protein: 6.3 g/dL — ABNORMAL LOW (ref 6.5–8.1)

## 2017-07-22 LAB — CBC WITH DIFFERENTIAL/PLATELET
BASOS ABS: 0 10*3/uL (ref 0.0–0.1)
Basophils Relative: 0 %
EOS ABS: 0 10*3/uL (ref 0.0–0.7)
Eosinophils Relative: 0 %
HCT: 40.9 % (ref 39.0–52.0)
Hemoglobin: 14 g/dL (ref 13.0–17.0)
LYMPHS ABS: 6.4 10*3/uL — AB (ref 0.7–4.0)
LYMPHS PCT: 49 %
MCH: 34.1 pg — ABNORMAL HIGH (ref 26.0–34.0)
MCHC: 34.2 g/dL (ref 30.0–36.0)
MCV: 99.8 fL (ref 78.0–100.0)
MONO ABS: 1 10*3/uL (ref 0.1–1.0)
Monocytes Relative: 8 %
NEUTROS ABS: 5.5 10*3/uL (ref 1.7–7.7)
Neutrophils Relative %: 43 %
PLATELETS: 122 10*3/uL — AB (ref 150–400)
RBC: 4.1 MIL/uL — ABNORMAL LOW (ref 4.22–5.81)
RDW: 13.5 % (ref 11.5–15.5)
WBC: 12.9 10*3/uL — ABNORMAL HIGH (ref 4.0–10.5)

## 2017-07-22 LAB — CK: Total CK: 1524 U/L — ABNORMAL HIGH (ref 49–397)

## 2017-07-22 LAB — PROTIME-INR
INR: 1.04
Prothrombin Time: 13.5 seconds (ref 11.4–15.2)

## 2017-07-22 LAB — PATHOLOGIST SMEAR REVIEW

## 2017-07-22 MED ORDER — LORAZEPAM 1 MG PO TABS: 0.0000 mg | ORAL_TABLET | Freq: Four times a day (QID) | ORAL | Status: DC

## 2017-07-22 MED ORDER — THIAMINE HCL 100 MG/ML IJ SOLN: 100.0000 mg | Freq: Every day | INTRAMUSCULAR | Status: DC

## 2017-07-22 MED ORDER — SODIUM CHLORIDE 0.9 % IV BOLUS (SEPSIS)
1000.0000 mL | Freq: Once | INTRAVENOUS | Status: AC
  Administered 2017-07-22: 1000 mL via INTRAVENOUS

## 2017-07-22 MED ORDER — POTASSIUM CHLORIDE CRYS ER 20 MEQ PO TBCR: 40.0000 meq | EXTENDED_RELEASE_TABLET | Freq: Once | ORAL | Status: DC

## 2017-07-22 MED ORDER — LORAZEPAM 1 MG PO TABS: 0.0000 mg | ORAL_TABLET | Freq: Two times a day (BID) | ORAL | Status: DC

## 2017-07-22 MED ORDER — LORAZEPAM 2 MG/ML IJ SOLN: 0.0000 mg | Freq: Two times a day (BID) | INTRAMUSCULAR | Status: DC

## 2017-07-22 MED ORDER — VITAMIN B-1 100 MG PO TABS: 100.0000 mg | ORAL_TABLET | Freq: Every day | ORAL | Status: DC

## 2017-07-22 MED ORDER — LORAZEPAM 2 MG/ML IJ SOLN: 0.0000 mg | Freq: Four times a day (QID) | INTRAMUSCULAR | Status: DC

## 2017-07-22 NOTE — ED Notes (Signed)
Pt was able to stand and ambulate with stand-by assistance. However, he was shuffling his feet instead of taking normal steps. He states that he feels really weak and "off balance" and doesn't feel like he can take steps right now.

## 2017-07-22 NOTE — ED Notes (Signed)
Bed: DU20 Expected date: 07/22/17 Expected time: 9:30 AM Means of arrival: Ambulance Comments: Ext injury IV and meds given

## 2017-07-22 NOTE — Care Management Note (Addendum)
Case Management Note  Patient Details  Name: DALON REICHART MRN: [id number] Date of Birth: December 06, 1945  CM consulted for [facility] and DME needs.  Spoke with pt and pt's niece at bedside to formulate a plan of care for transition back home.  CM discussed the process of HH and PCS.  CM additionally discussed with them that if the [facility] PT/OT team determined he needed SNF rehab they would work with the [facility] CSW and the pt's PCP for placement.  CM and pt discussed DME needs to include a 3-in-1, walker, and wheelchair.  Pt is aware that the DME agency may not be able to provide a walker and wheelchair at the same time and since pt has access to a walker through family the preference and greater need is a wheelchair.  Pt additionally believes he has a shower chair at home already.  Pt and pt's niece are comfortable with plan.  Pt chose [facility].  Contacted Lattie Haw who accepted the pt for services.  Pt additionally chose [facility].  Contacted Tim who advised he would come to the ED to see the pt and niece prior to transport home.  Contacted [facility] for DME and advised that the DME would need to be delivered.  CM updated Dr. Regenia Skeeter and primary RN of the plan and that pt would need to be transported via [facility] for safety concerns.  Expected Discharge Date:   07/23/2017               Expected Discharge Plan:  [place]  In-House Referral:  Clinical Social Work  Discharge planning Services  CM Consult  Post Acute Care Choice:  Durable Medical Equipment, Home Health Choice offered to:  Patient, [facility] POA / Guardian  DME Arranged:  3-N-1, Gilford Rile, Wheelchair manual DME Agency:  [place]:  RN, PT, OT, Nurse's Aide, Social Work, Designer, jewellery [employer] Agency:  [facility])  Status of Service:  Completed, signed off  Felix Pratt, Benjaman Lobe, RN 07/22/2017, 7:02 PM

## 2017-07-22 NOTE — ED Notes (Signed)
Pt lying in bed. He reports that he feels weaker than normal.

## 2017-07-22 NOTE — ED Notes (Signed)
Pt transported to XR.  

## 2017-07-22 NOTE — ED Notes (Signed)
Patient is dressed, [facility] called.

## 2017-07-22 NOTE — ED Notes (Signed)
Pt continues to try to get up to "check his balance." He has been verbally redirected to stay in the bed and call if he needs anything

## 2017-07-22 NOTE — Discharge Instructions (Signed)
Your lab work indicated mild rhabdomyolysis.  This indicates muscle breakdown.  It is important to stay very hydrated by drinking plenty of water.  If you develop dark, red, or tea colored urine, this means that the muscle breakdown could be affecting your kidneys and you need to come back to the ER immediately.  Otherwise follow-up with your primary care doctor for follow-up and workup of your weakness.  Is important to have better nutrition as alcohol can affect multiple systems in your body.  You  ikely need to cut back on alcohol and should seek professional help for this.

## 2017-07-22 NOTE — ED Provider Notes (Signed)
[place] DEPT Provider Note   CSN: [id number] Arrival date & time: 07/22/17  0931     History   Chief Complaint Chief Complaint  Patient presents with  . Shoulder Injury  . Fall    HPI JILES GOYA is a 72 y.o. male.  HPI Patient is a 72 year old male who fell early Monday morning and is been lying then.  He states he has severe right shoulder pain and was too weak to get up off the ground.  He was found by a neighbor today.  No fevers or chills.  No chest pain or shortness of breath.  Denies abdominal pain.  Denies pain in his hips.  Brought to the ER for further evaluation.   Past Medical History:  Diagnosis Date  . Allergic rhinitis   . Allergy    pt denies  . Aneurysm ([place])   . Anxiety   . COPD (chronic obstructive pulmonary disease) ([place])   . Depression   . DM type 2 (diabetes mellitus, type 2) ([place])   . Hyperlipidemia   . Hypertension   . Tobacco abuse     Patient Active Problem List   Diagnosis Date Noted  . Coronary artery disease due to calcified coronary lesion 06/15/2014  . COPD with emphysema ([place]) 06/15/2014  . Cancer screening 06/08/2014  . Back pain, chronic 04/06/2013  . Diabetes type 2, controlled ([place]) 08/05/2011  . 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 Anxiety state 01/19/2008  . TOBACCO ABUSE 01/19/2008  . Depression with anxiety 01/19/2008  . Hyperlipidemia, group A 12/09/2007  . Essential hypertension 12/09/2007  . Allergic rhinitis 12/09/2007    Past Surgical History:  Procedure Laterality Date  . COLONOSCOPY    . corneal transplant surgery left eye    . FOOT SURGERY     hammer toe and bone spur  . LUMBAR DISC SURGERY    . LUMBAR LAMINECTOMY         Home Medications    Prior to Admission medications   Medication Sig Start Date End Date Taking? Authorizing Provider  albuterol (PROVENTIL HFA;VENTOLIN HFA) 108 (90 Base) MCG/ACT inhaler TAKE 2 PUFFS BY  MOUTH EVERY 4 HOURS AS NEEDED FOR WHEEZE 04/02/17  Yes Nafziger, Tommi Rumps, NP  aspirin 81 MG tablet Take 81 mg by mouth daily.     Yes [provider]  citalopram (CELEXA) 40 MG tablet Take 1 tablet (40 mg total) by mouth daily. 05/23/16  Yes Nafziger, Tommi Rumps, NP  Cyanocobalamin (VITAMIN B 12 PO) Take 2,000 mcg by mouth daily.   Yes [provider]  diazepam (VALIUM) 5 MG tablet TAKE 1 TABLET BY MOUTH 3 TIMES A DAY 07/21/17  Yes Nafziger, Tommi Rumps, NP  escitalopram (LEXAPRO) 20 MG tablet TAKE 1 TABLET BY MOUTH EVERY DAY 06/09/17  Yes Nafziger, Tommi Rumps, NP  fluticasone furoate-vilanterol (BREO ELLIPTA) 100-25 MCG/INH AEPB Inhale 1 puff into the lungs daily. 04/09/17  Yes Nafziger, Tommi Rumps, NP  KLOR-CON 10 10 MEQ tablet TAKE 1 TABLET BY MOUTH EVERY DAY 06/10/17  Yes Nafziger, Tommi Rumps, NP  losartan-hydrochlorothiazide (HYZAAR) 100-25 MG tablet TAKE 1 TABLET BY MOUTH EVERY DAY 06/10/17  Yes Nafziger, Tommi Rumps, NP  metFORMIN (GLUCOPHAGE) 1000 MG tablet TAKE 1 TABLET BY MOUTH TWICE A DAY 02/26/17  Yes Nafziger, Tommi Rumps, NP  Probiotic Product (ALIGN PO) Take 1 capsule by mouth daily.   Yes [provider]  simvastatin (ZOCOR) 40 MG tablet TAKE 1 TABLET BY MOUTH AT BEDTIME 11/28/16  Yes  Nafziger, Tommi Rumps, NP  triamcinolone cream (KENALOG) 0.5 % APPLY TOPICALLY TWICE A DAY 04/29/17  Yes Nafziger, Tommi Rumps, NP  Continuous Blood Gluc Receiver (FREESTYLE LIBRE READER) DEVI 1 Device by Does not apply route daily. USE TO TEST BLOOD GLUCOSE ONCE DAILY 04/09/17   Nafziger, Tommi Rumps, NP  Continuous Blood Gluc Sensor (FREESTYLE LIBRE SENSOR SYSTEM) MISC USE TO TEST BLOOD GLUCOSE ONCE DAILY 04/09/17   Nafziger, Tommi Rumps, NP  [facility] DELICA LANCETS FINE MISC Test twice daily. 01/04/15   Nafziger, Tommi Rumps, NP  ONETOUCH VERIO test strip CHECK TWICE DAILY. 04/18/16   [name], NP    Family History Family History  Problem Relation Age of Onset  . Depression Other   . Hypertension Other   . Heart disease Father   . Alcohol abuse Father    . Cancer Mother        intestines-duodenal cancer vs stomach per pt.  . Stomach cancer Mother   . Colon cancer Neg Hx   . Colon polyps Neg Hx   . Esophageal cancer Neg Hx   . Rectal cancer Neg Hx     Social History Social History   Tobacco Use  . Smoking status: Current Every Day Smoker    Packs/day: 1.00    Years: 54.00    Pack years: 54.00    Types: Cigarettes    Start date: 07/07/1960  . Smokeless tobacco: Never Used  . Tobacco comment: used to smoke 3 ppd - currently smoking 1 ppd - started smoking at age 36.  Substance Use Topics  . Alcohol use: Yes    Alcohol/week: 16.8 oz    Types: 28 Shots of liquor per week    Comment: Patient thinks he self medicates with alcohol for back pain. Does not drink if taking hydrocodone- 2 scothches a day   . Drug use: No     Allergies   Patient has no known allergies.   Review of Systems Review of Systems  All other systems reviewed and are negative.    Physical Exam Updated Vital Signs BP (!) 121/54   Pulse (!) 59   Temp 98 F (36.7 C) (Oral)   Resp 16   SpO2 95%   Physical Exam  Constitutional: He is oriented to person, place, and time. He appears well-developed and well-nourished.  HENT:  Head: Normocephalic and atraumatic.  Eyes: EOM are normal.  Neck: Normal range of motion.  Cardiovascular: Normal rate, regular rhythm, normal heart sounds and intact distal pulses.  Pulmonary/Chest: Effort normal and breath sounds normal. No respiratory distress.  Abdominal: Soft. He exhibits no distension. There is no tenderness.  Musculoskeletal: Normal range of motion.  Full range of motion bilateral hips, knees, ankles.  Full range of motion of bilateral elbows, wrist.  Mild pain with range of motion of the right shoulder  Neurological: He is alert and oriented to person, place, and time.  Skin: Skin is warm and dry.  Psychiatric: He has a normal mood and affect. Judgment normal.  Nursing note and vitals reviewed.    ED  Treatments / Results  Labs (all labs ordered are listed, but only abnormal results are displayed) Labs Reviewed  CBC WITH DIFFERENTIAL/PLATELET - Abnormal; Notable for the following components:      Result Value   WBC 12.9 (*)    RBC 4.10 (*)    MCH 34.1 (*)    Platelets 122 (*)    Lymphs Abs 6.4 (*)    All other components within normal limits  COMPREHENSIVE METABOLIC PANEL - Abnormal; Notable for the following components:   Potassium 3.4 (*)    Glucose, Bld 149 (*)    Calcium 8.8 (*)    Total Protein 6.3 (*)    Total Bilirubin 1.4 (*)    All other components within normal limits  CK - Abnormal; Notable for the following components:   Total CK 1,524 (*)    All other components within normal limits  PROTIME-INR  PATHOLOGIST SMEAR REVIEW    EKG  EKG Interpretation None       Radiology Dg Chest 2 View  Result Date: 07/22/2017 CLINICAL DATA:  [place] 3 days ago with pain in the right humerus and shoulder EXAM: CHEST  2 VIEW COMPARISON:  CT chest of 05/17/2015 and chest x-ray of 03/28/2015 FINDINGS: The lungs are clear but slightly hyperaerated. Mediastinal and hilar contours are stable and mild cardiomegaly is stable. There are mild degenerative changes in the mid to lower thoracic spine. IMPRESSION: Slight hyper aeration.  No active lung disease. Electronically Signed   By: Ivar Drape M.D.   On: 07/22/2017 10:34   Dg Shoulder Right  Result Date: 07/22/2017 CLINICAL DATA:  [place] 3 days ago with pain in the right humerus and shoulder EXAM: RIGHT SHOULDER - 2+ VIEW COMPARISON:  None. FINDINGS: The right humeral head is in normal position and the glenohumeral joint space is unremarkable with only mild degenerative change for age. There are degenerative changes in the right AC joint. No acute fracture is seen. IMPRESSION: Mild degenerative change of the right shoulder joint and right AC joint. No acute abnormality. Electronically Signed   By: Ivar Drape M.D.   On: 07/22/2017 10:32    Ct Head Wo Contrast  Result Date: 07/22/2017 CLINICAL DATA:  Fall, right shoulder pain EXAM: CT HEAD WITHOUT CONTRAST TECHNIQUE: Contiguous axial images were obtained from the base of the skull through the vertex without intravenous contrast. COMPARISON:  None. FINDINGS: Brain: No acute territorial infarction, hemorrhage or intracranial mass is visualized. Mild to moderate atrophy. Prominent ventricles, likely due to atrophy. Mild small vessel ischemic changes of the white matter Vascular: No hyperdense vessels. Scattered calcifications at the carotid siphon Skull: Normal. Negative for fracture or focal lesion. Sinuses/Orbits: Minimal mucosal thickening in the ethmoid sinuses. No acute orbital abnormality. Other: None IMPRESSION: No CT evidence for acute intracranial abnormality. Atrophy and mild small vessel ischemic changes of the white matter. Electronically Signed   By: Donavan Foil M.D.   On: 07/22/2017 14:13   Dg Humerus Right  Result Date: 07/22/2017 CLINICAL DATA:  [place] 3 days ago with pain in the right humerus and right shoulder EXAM: RIGHT HUMERUS - 2+ VIEW COMPARISON:  None FINDINGS: The right humerus is intact. No acute fracture is seen. Alignment is normal. IMPRESSION: Negative. Electronically Signed   By: Ivar Drape M.D.   On: 07/22/2017 10:33    Procedures Procedures (including critical care time)  Medications Ordered in ED Medications  sodium chloride 0.9 % bolus 1,000 mL (1,000 mLs Intravenous New Bag/Given 07/22/17 1024)     Initial Impression / Assessment and Plan / ED Course  I have reviewed the triage vital signs and the nursing notes.  Pertinent labs & imaging results that were available during my care of the patient were reviewed by me and considered in my medical decision making (see chart for details).     Patient is overall well-appearing.  He is too weak to get up off the floor.  Physical therapy  will evaluate the patient at this time.  Social work will be  involved.  Care to Dr Regenia Skeeter   Final Clinical Impressions(s) / ED Diagnoses   Final diagnoses:  None    ED Discharge Orders    None       Jola Schmidt, MD 07/22/17 1739

## 2017-07-22 NOTE — ED Provider Notes (Signed)
Care transferred to me.  Patient is waiting on case management. After discussing with case management, they are working on trying to possibly place the patient.  He will be placed on CIWA protocol as he has not had alcohol in the last 4 or up to 6 days and chronically drinks multiple liquor drinks a day.  He appears mildly tremulous.  Disposition pending.  Case manager has talked with Education officer, museum and family.  Family/power of attorney and patient feel comfortable going home and they will help assist him at home until home health is available.  Home health is being set up as we speak.  I have ordered DME walker, wheelchair, and 3 and 1.  He will will discharge home to follow-up with PCP for further outpatient workup of the weakness but I think this is coming from the alcohol abuse.  He will be given oral potassium.  Discharge home with return precautions.   Sherwood Gambler, MD 07/22/17 [phone]

## 2017-07-22 NOTE — ED Triage Notes (Signed)
Pt BIB [facility] for R shoulder pain following a fall on Sunday. Pt reports that he turned around too quickly, causing him to lose his balance and fall on his R shoulder. Pt has been unable to get off of the floor since Sunday. Bruising noted to R upper arm and shoulder. He reports being on blood thinners, but is unsure which one. He denies LOC. A&Ox4 for triage. Hx HTN, COPD, and diabetes.

## 2017-07-22 NOTE — Progress Notes (Signed)
Consult request has been received. CSW attempting to follow up at present time.  CSW met with EDP for possible placement.  Per EPD pt had support at bedside now.  CSW met with pt, pt's adult niece who is Medical POA and pt's neighbor.  Per pt's niece, pt's mother lives with pt's niece in [place] and pt has a cousin who lives in [place].  Per pt's niece who is supportive and closely involved (per niece), pt's neighbor who was present can check on the pt regularly.  Per EDP pt has no rehab-able injury, CT scans and x-ray came back as negative.    Pt will be D/C's with HH, CM aware after CSW updated and EDP will place CM consult.  Pt and pt's family were appreciative and thanked the CSW.  Please reconsult if future social work needs arise.  CSW signing off, as social work intervention is no longer needed.  Alphonse Guild. Alyxandria Wentz, LCSW, LCAS, CSI Clinical Social Worker Ph: [phone]

## 2017-07-23 NOTE — ED Provider Notes (Signed)
    Durable Medical Equipment  (From admission, onward)        Start     Ordered   07/22/17 0000  For home use only DME 3 n 1     07/22/17 1917   07/22/17 0000  For home use only DME Walker    Question:  Patient needs a walker to treat with the following condition  Answer:  Weakness   07/22/17 1917   07/22/17 0000  For home use only DME standard manual wheelchair with seat cushion    Comments:  Patient suffers from weakness from alcohol abuse which impairs their ability to perform daily activities like bathing, dressing, feeding, grooming and toileting in the home.  A walker will not resolve  issue with performing activities of daily living. A wheelchair will allow patient to safely perform daily activities. Patient can safely propel the wheelchair in the home or has a caregiver who can provide assistance.  Accessories: elevating leg rests (ELRs), wheel locks, extensions and anti-tippers.   07/22/17 [place], Teesha Ohm, MD 07/23/17 1040

## 2017-07-28 ENCOUNTER — Encounter: Payer: Self-pay | Admitting: Adult Health

## 2017-07-28 ENCOUNTER — Telehealth: Payer: Self-pay | Admitting: Adult Health

## 2017-07-28 ENCOUNTER — Ambulatory Visit (INDEPENDENT_AMBULATORY_CARE_PROVIDER_SITE_OTHER): Payer: Medicare Other | Admitting: Adult Health

## 2017-07-28 VITALS — BP 126/72 | HR 87 | Temp 97.9°F

## 2017-07-28 DIAGNOSIS — M25511 Pain in right shoulder: Secondary | ICD-10-CM | POA: Diagnosis not present

## 2017-07-28 DIAGNOSIS — D72829 Elevated white blood cell count, unspecified: Secondary | ICD-10-CM

## 2017-07-28 LAB — CBC WITH DIFFERENTIAL/PLATELET
BASOS PCT: 0.3 % (ref 0.0–3.0)
Basophils Absolute: 0 10*3/uL (ref 0.0–0.1)
EOS ABS: 0.1 10*3/uL (ref 0.0–0.7)
Eosinophils Relative: 1 % (ref 0.0–5.0)
HEMATOCRIT: 44.5 % (ref 39.0–52.0)
HEMOGLOBIN: 15 g/dL (ref 13.0–17.0)
Lymphs Abs: 5.1 10*3/uL — ABNORMAL HIGH (ref 0.7–4.0)
MCHC: 33.7 g/dL (ref 30.0–36.0)
MCV: 100.6 fl — ABNORMAL HIGH (ref 78.0–100.0)
Monocytes Absolute: 0.5 10*3/uL (ref 0.1–1.0)
Monocytes Relative: 5.6 % (ref 3.0–12.0)
Neutro Abs: 2.8 10*3/uL (ref 1.4–7.7)
Neutrophils Relative %: 33 % — ABNORMAL LOW (ref 43.0–77.0)
Platelets: 181 10*3/uL (ref 150.0–400.0)
RBC: 4.43 Mil/uL (ref 4.22–5.81)
RDW: 13.6 % (ref 11.5–15.5)
WBC: 8.5 10*3/uL (ref 4.0–10.5)

## 2017-07-28 NOTE — Telephone Encounter (Signed)
Ok for verbal orders ?

## 2017-07-28 NOTE — Progress Notes (Signed)
Subjective:    Patient ID: Drew Burke, male    DOB: Sep 10, 1945, 72 y.o.   MRN: [id number]  HPI   72 year old male who  has a past medical history of Allergic rhinitis, Allergy, Aneurysm ([place]), Anxiety, COPD (chronic obstructive pulmonary disease) ([place]), Depression, DM type 2 (diabetes mellitus, type 2) ([place]), Hyperlipidemia, Hypertension, and Tobacco abuse.  He presents the office today after being seen in the emergency room 6 days ago.  Per ER notes he had fallen at home 2 days prior and had been laying on the floor until a neighbor found him and took him to the emergency room.  He reports that he had severe right shoulder pain and was too weak to get up off the ground.    CT of the head did not show any acute abnormality Right shoulder and right humerus was negative for fracture or dislocation  In the emergency room his white blood cell count was abnormally high at 12.9 and total CK was 1524.  Social work was trying to get patient placed and he was sent home with a prescription for walker as well as a manual wheelchair. He has his walker and is waiting for wheelchair to come in.   Pain in the office he reports that he continues to have pain in his right shoulder, he has decreased ROM and weakness in the right arm.   He is starting PT at home this afternoon.   He has ordered life alert  Review of Systems See HPI   Past Medical History:  Diagnosis Date  . Allergic rhinitis   . Allergy    pt denies  . Aneurysm ([place])   . Anxiety   . COPD (chronic obstructive pulmonary disease) ([place])   . Depression   . DM type 2 (diabetes mellitus, type 2) ([place])   . Hyperlipidemia   . Hypertension   . Tobacco abuse     Social History   Socioeconomic History  . Marital status: Divorced    Spouse name: Not on file  . Number of children: Not on file  . Years of education: Not on file  . Highest education level: Not on file  Social Needs  . Financial resource strain: Not on file  .  Food insecurity - worry: Not on file  . Food insecurity - inability: Not on file  . Transportation needs - medical: Not on file  . Transportation needs - non-medical: Not on file  Occupational History  . Occupation: Scientist, clinical (histocompatibility and immunogenetics): [employer]  Tobacco Use  . Smoking status: Current Every Day Smoker    Packs/day: 1.00    Years: 54.00    Pack years: 54.00    Types: Cigarettes    Start date: 07/07/1960  . Smokeless tobacco: Never Used  . Tobacco comment: used to smoke 3 ppd - currently smoking 1 ppd - started smoking at age 89.  Substance and Sexual Activity  . Alcohol use: Yes    Alcohol/week: 16.8 oz    Types: 28 Shots of liquor per week    Comment: Patient thinks he self medicates with alcohol for back pain. Does not drink if taking hydrocodone- 2 scothches a day   . Drug use: No  . Sexual activity: Not on file  Other Topics Concern  . Not on file  Social History Narrative   Retired - Diplomatic Services operational officer by himself   Has a dog    Past Surgical History:  Procedure  Laterality Date  . COLONOSCOPY    . corneal transplant surgery left eye    . FOOT SURGERY     hammer toe and bone spur  . LUMBAR DISC SURGERY    . LUMBAR LAMINECTOMY      Family History  Problem Relation Age of Onset  . Depression Other   . Hypertension Other   . Heart disease Father   . Alcohol abuse Father   . Cancer Mother        intestines-duodenal cancer vs stomach per pt.  . Stomach cancer Mother   . Colon cancer Neg Hx   . Colon polyps Neg Hx   . Esophageal cancer Neg Hx   . Rectal cancer Neg Hx     No Known Allergies  Current Outpatient Medications on File Prior to Visit  Medication Sig Dispense Refill  . albuterol (PROVENTIL HFA;VENTOLIN HFA) 108 (90 Base) MCG/ACT inhaler TAKE 2 PUFFS BY MOUTH EVERY 4 HOURS AS NEEDED FOR WHEEZE 8.5 Inhaler 0  . aspirin 81 MG tablet Take 81 mg by mouth daily.      . citalopram (CELEXA) 40 MG tablet Take 1 tablet (40 mg total) by mouth daily. 90 tablet  1  . Continuous Blood Gluc Receiver (FREESTYLE LIBRE READER) DEVI 1 Device by Does not apply route daily. USE TO TEST BLOOD GLUCOSE ONCE DAILY 1 Device 0  . Continuous Blood Gluc Sensor (FREESTYLE LIBRE SENSOR SYSTEM) MISC USE TO TEST BLOOD GLUCOSE ONCE DAILY 1 each 0  . Cyanocobalamin (VITAMIN B 12 PO) Take 2,000 mcg by mouth daily.    . diazepam (VALIUM) 5 MG tablet TAKE 1 TABLET BY MOUTH 3 TIMES A DAY 90 tablet 0  . escitalopram (LEXAPRO) 20 MG tablet TAKE 1 TABLET BY MOUTH EVERY DAY 90 tablet 0  . fluticasone furoate-vilanterol (BREO ELLIPTA) 100-25 MCG/INH AEPB Inhale 1 puff into the lungs daily. 28 each 11  . KLOR-CON 10 10 MEQ tablet TAKE 1 TABLET BY MOUTH EVERY DAY 90 tablet 0  . losartan-hydrochlorothiazide (HYZAAR) 100-25 MG tablet TAKE 1 TABLET BY MOUTH EVERY DAY 90 tablet 0  . metFORMIN (GLUCOPHAGE) 1000 MG tablet TAKE 1 TABLET BY MOUTH TWICE A DAY 180 tablet 0  . ONETOUCH DELICA LANCETS FINE MISC Test twice daily. 100 each 5  . ONETOUCH VERIO test strip CHECK TWICE DAILY. 100 each 3  . Probiotic Product (ALIGN PO) Take 1 capsule by mouth daily.    . simvastatin (ZOCOR) 40 MG tablet TAKE 1 TABLET BY MOUTH AT BEDTIME 90 tablet 3  . triamcinolone cream (KENALOG) 0.5 % APPLY TOPICALLY TWICE A DAY 30 g 1   No current facility-administered medications on file prior to visit.     BP 126/72 (BP Location: Left Arm)   Pulse 87   Temp 97.9 F (36.6 C) (Oral)   SpO2 95%       Objective:   Physical Exam  Constitutional: He is oriented to person, place, and time. He appears well-developed and well-nourished. No distress.  Cardiovascular: Normal rate, regular rhythm, normal heart sounds and intact distal pulses. Exam reveals no gallop and no friction rub.  No murmur heard. Pulmonary/Chest: Effort normal and breath sounds normal. No respiratory distress. He has no wheezes. He has no rales. He exhibits no tenderness.  Musculoskeletal:  No pain with palpation to right shoulder     Neurological: He is alert and oriented to person, place, and time. He displays tremor (mild in right arm ).  No decreased grip strength  Has decreased ROM with right shoulder. He is unable to lift arm above is head.   Skin: Skin is warm and dry. No rash noted. He is not diaphoretic. No erythema. No pallor.  Psychiatric: He has a normal mood and affect. His behavior is normal. Judgment and thought content normal.  Nursing note and vitals reviewed.      Assessment & Plan:  1. Acute pain of right shoulder - AMB referral to orthopedics - CK total and CKMB (cardiac)not at [facility]  2. Leukocytosis, unspecified type  - CBC with Differential/Platelet  [name], NP

## 2017-07-28 NOTE — Telephone Encounter (Signed)
Copied from [place] [id number]. Topic: Quick Communication - See Telephone Encounter >> Jul 28, 2017  1:58 PM Arletha Grippe wrote: CRM for notification. See Telephone encounter for:   07/28/17. [facility] - she is requesting verbal orders Physical therapy  3 week 4 2 week 4 Cb# [phone]

## 2017-07-28 NOTE — Telephone Encounter (Signed)
Left a message for a return call.

## 2017-07-29 LAB — CK TOTAL AND CKMB (NOT AT ARMC)
CK, MB: 4.8 ng/mL (ref 0–5.0)
RELATIVE INDEX: 3.2 (ref 0–4.0)
Total CK: 151 U/L (ref 44–196)

## 2017-07-31 ENCOUNTER — Telehealth: Payer: Self-pay | Admitting: Adult Health

## 2017-07-31 NOTE — Telephone Encounter (Signed)
Ok for verbal orders ?

## 2017-07-31 NOTE — Telephone Encounter (Signed)
Tried calling X2.  Received a message that "all circuits are busy now."  Will try again at a later time.

## 2017-07-31 NOTE — Telephone Encounter (Signed)
Copied from [place] [id number]. Topic: Quick Communication - See Telephone Encounter >> Jul 31, 2017 12:25 PM [place], Laban Emperor wrote: CRM for notification. See Telephone encounter for: [place] from [facility] calling to request verbal orders for: OT for 2 week 6.  Contact number [phone]  07/31/17.

## 2017-07-31 NOTE — Telephone Encounter (Signed)
Left a message for a return call.

## 2017-08-04 ENCOUNTER — Ambulatory Visit (INDEPENDENT_AMBULATORY_CARE_PROVIDER_SITE_OTHER): Payer: Medicare Other | Admitting: Orthopaedic Surgery

## 2017-08-04 ENCOUNTER — Encounter (INDEPENDENT_AMBULATORY_CARE_PROVIDER_SITE_OTHER): Payer: Self-pay | Admitting: Orthopaedic Surgery

## 2017-08-04 DIAGNOSIS — M25511 Pain in right shoulder: Secondary | ICD-10-CM | POA: Diagnosis not present

## 2017-08-04 NOTE — Progress Notes (Signed)
Office Visit Note   Patient: Drew Burke           Date of Birth: June 24, 1946           MRN: [id number] Visit Date: 08/04/2017              Requested by: [name], NP [address] [place], [place] [address] PCP: [name], NP   Assessment & Plan: Visit Diagnoses:  1. Acute pain of right shoulder     Plan: Impression is right shoulder acute traumatic rotator cuff tear.  Given this patient's age and mechanism of injury and inability to lift his right arm, I feel is appropriate to assess his rotator cuff with an MRI.  He will follow-up with [place] once this is completed.  Follow-Up Instructions: Return for after mri.   Orders:  Orders Placed This Encounter  Procedures  . MR SHOULDER RIGHT WO CONTRAST   No orders of the defined types were placed in this encounter.     Procedures: No procedures performed   Clinical Data: No additional findings.   Subjective: Chief Complaint  Patient presents with  . Right Shoulder - Pain    HPI Drew Burke is a pleasant 72 year old gentleman who presents our clinic today with a new injury to his right shoulder.  On 07/21/2016, he fell landing on his right side.  He was found the following day and taken to the [facility] ED.  X-rays were obtained which were negative for fracture.  He comes in today for follow-up.  Marked pain and weakness to the right upper extremity.  No previous injury or surgical intervention to the right side.  No numbness tingling burning or any other systemic symptoms.  Review of Systems as detailed in HPI.  All others reviewed and are negative.  Objective: Vital Signs: There were no vitals taken for this visit.  Physical Exam well-developed well-nourished gentleman in no acute distress.  Alert and oriented x3.  Ortho Exam examination of his right shoulder reveals 20% active range of motion.  I can get him to about 80% forward flexion.  Positive drop arm.  He is neurovascular intact  distally.  Specialty Comments:  No specialty comments available.  Imaging: X-rays of the right shoulder reviewed by me and canopy are negative for fracture.   PMFS History: Patient Active Problem List   Diagnosis Date Noted  . Acute pain of right shoulder 08/04/2017  . Coronary artery disease due to calcified coronary lesion 06/15/2014  . COPD with emphysema ([place]) 06/15/2014  . Cancer screening 06/08/2014  . Back pain, chronic 04/06/2013  . Diabetes type 2, controlled ([place]) 08/05/2011  . 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 Anxiety state 01/19/2008  . TOBACCO ABUSE 01/19/2008  . Depression with anxiety 01/19/2008  . Hyperlipidemia, group A 12/09/2007  . Essential hypertension 12/09/2007  . Allergic rhinitis 12/09/2007   Past Medical History:  Diagnosis Date  . Allergic rhinitis   . Allergy    pt denies  . Aneurysm ([place])   . Anxiety   . COPD (chronic obstructive pulmonary disease) ([place])   . Depression   . DM type 2 (diabetes mellitus, type 2) ([place])   . Hyperlipidemia   . Hypertension   . Tobacco abuse     Family History  Problem Relation Age of Onset  . Depression Other   . Hypertension Other   . Heart disease Father   . Alcohol abuse Father   . Cancer  Mother        intestines-duodenal cancer vs stomach per pt.  . Stomach cancer Mother   . Colon cancer Neg Hx   . Colon polyps Neg Hx   . Esophageal cancer Neg Hx   . Rectal cancer Neg Hx     Past Surgical History:  Procedure Laterality Date  . COLONOSCOPY    . corneal transplant surgery left eye    . FOOT SURGERY     hammer toe and bone spur  . LUMBAR DISC SURGERY    . LUMBAR LAMINECTOMY     Social History   Occupational History  . Occupation: Scientist, clinical (histocompatibility and immunogenetics): [employer]  Tobacco Use  . Smoking status: Current Every Day Smoker    Packs/day: 1.00    Years: 54.00    Pack years: 54.00    Types: Cigarettes    Start date: 07/07/1960  . Smokeless tobacco:  Never Used  . Tobacco comment: used to smoke 3 ppd - currently smoking 1 ppd - started smoking at age 37.  Substance and Sexual Activity  . Alcohol use: Yes    Alcohol/week: 16.8 oz    Types: 28 Shots of liquor per week    Comment: Patient thinks he self medicates with alcohol for back pain. Does not drink if taking hydrocodone- 2 scothches a day   . Drug use: No  . Sexual activity: Not on file

## 2017-08-07 NOTE — Telephone Encounter (Signed)
Called and left message to return call. 

## 2017-08-07 NOTE — Telephone Encounter (Signed)
Verbal orders given to [facility] as directed. See related phone note. Drew Burke was able to take both PT and OT orders.

## 2017-08-07 NOTE — Telephone Encounter (Signed)
Verbal orders given as directed. 

## 2017-08-14 ENCOUNTER — Telehealth (INDEPENDENT_AMBULATORY_CARE_PROVIDER_SITE_OTHER): Payer: Self-pay | Admitting: Orthopaedic Surgery

## 2017-08-14 NOTE — Telephone Encounter (Signed)
[facility]) called concerning the appt they have on Tuesday and wants a call back from you, Ria Comment, or Dr. Erlinda Hong today if possible # [phone]

## 2017-08-15 ENCOUNTER — Ambulatory Visit
Admission: RE | Admit: 2017-08-15 | Discharge: 2017-08-15 | Disposition: A | Discharge status: Home / Self Care | Payer: Medicare Other | Source: Ambulatory Visit | Attending: Orthopaedic Surgery | Admitting: Orthopaedic Surgery

## 2017-08-15 DIAGNOSIS — M25511 Pain in right shoulder: Secondary | ICD-10-CM

## 2017-08-17 NOTE — Telephone Encounter (Signed)
See message.

## 2017-08-17 NOTE — Telephone Encounter (Signed)
Spoke to Drew Burke.  Coming in to discuss MRI tomorrow.  FYI-no narcotics for this patient

## 2017-08-18 ENCOUNTER — Encounter (INDEPENDENT_AMBULATORY_CARE_PROVIDER_SITE_OTHER): Payer: Self-pay | Admitting: Orthopaedic Surgery

## 2017-08-18 ENCOUNTER — Ambulatory Visit (INDEPENDENT_AMBULATORY_CARE_PROVIDER_SITE_OTHER): Payer: Medicare Other | Admitting: Orthopaedic Surgery

## 2017-08-18 DIAGNOSIS — M75121 Complete rotator cuff tear or rupture of right shoulder, not specified as traumatic: Secondary | ICD-10-CM | POA: Insufficient documentation

## 2017-08-18 MED ORDER — LIDOCAINE HCL 2 % IJ SOLN
2.0000 mL | INTRAMUSCULAR | Status: AC | PRN
Start: 2017-08-18 — End: 2017-08-18
  Administered 2017-08-18: 2 mL

## 2017-08-18 MED ORDER — BUPIVACAINE HCL 0.25 % IJ SOLN
2.0000 mL | INTRAMUSCULAR | Status: AC | PRN
  Administered 2017-08-18: 2 mL via INTRA_ARTICULAR

## 2017-08-18 MED ORDER — METHYLPREDNISOLONE ACETATE 40 MG/ML IJ SUSP
40.0000 mg | INTRAMUSCULAR | Status: AC | PRN
  Administered 2017-08-18: 40 mg via INTRA_ARTICULAR

## 2017-08-18 NOTE — Progress Notes (Signed)
Office Visit Note   Patient: Drew Burke           Date of Birth: 14-Apr-1946           MRN: [id number] Visit Date: 08/18/2017              Requested by: [name], NP [address] [place], [place] [address] PCP: [name], NP   Assessment & Plan: Visit Diagnoses:  1. Complete tear of right rotator cuff     Plan: Today, we discussed MRI findings of the right shoulder to include a 3-1/2 cm retracted full thickness rotator cuff tear of the supraspinatus.  Given [name] age and activity level and the likelihood of the inability to fix this, we feel it is appropriate to proceed with a cortisone injection.  We have also discussed future definitive treatment of a reverse total shoulder replacement if needed.  We are hopeful that the cortisone injection performed today will give him moderate relief of symptoms.  Follow-Up Instructions: No Follow-up on file.   Orders:  No orders of the defined types were placed in this encounter.  No orders of the defined types were placed in this encounter.     Procedures: Large Joint Inj: R subacromial bursa on 08/18/2017 3:44 PM Indications: pain Details: 22 G needle Medications: 2 mL lidocaine 2 %; 2 mL bupivacaine 0.25 %; 40 mg methylPREDNISolone acetate 40 MG/ML Outcome: tolerated well, no immediate complications Patient was prepped and draped in the usual sterile fashion.       Clinical Data: No additional findings.   Subjective: Chief Complaint  Patient presents with  . Right Shoulder - Pain    HPI Drew Burke comes in to discuss MRI results of his right shoulder from 08/15/2017.  MRI results reveal a large full-thickness retracted rotator cuff tear to the supraspinatus with retraction to 3.5 cm.  Drew Burke has had continued pain and weakness.   Objective: Vital Signs: There were no vitals taken for this visit.      Specialty Comments:  No specialty comments available.  Imaging: None today   PMFS  History: Patient Active Problem List   Diagnosis Date Noted  . Complete tear of right rotator cuff 08/18/2017  . Acute pain of right shoulder 08/04/2017  . Coronary artery disease due to calcified coronary lesion 06/15/2014  . COPD with emphysema ([place]) 06/15/2014  . Cancer screening 06/08/2014  . Back pain, chronic 04/06/2013  . Diabetes type 2, controlled ([place]) 08/05/2011  . 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 Anxiety state 01/19/2008  . TOBACCO ABUSE 01/19/2008  . Depression with anxiety 01/19/2008  . Hyperlipidemia, group A 12/09/2007  . Essential hypertension 12/09/2007  . Allergic rhinitis 12/09/2007   Past Medical History:  Diagnosis Date  . Allergic rhinitis   . Allergy    pt denies  . Aneurysm ([place])   . Anxiety   . COPD (chronic obstructive pulmonary disease) ([place])   . Depression   . DM type 2 (diabetes mellitus, type 2) ([place])   . Hyperlipidemia   . Hypertension   . Tobacco abuse     Family History  Problem Relation Age of Onset  . Depression Other   . Hypertension Other   . Heart disease Father   . Alcohol abuse Father   . Cancer Mother        intestines-duodenal cancer vs stomach per pt.  . Stomach cancer Mother   . Colon cancer Neg Hx   .  Colon polyps Neg Hx   . Esophageal cancer Neg Hx   . Rectal cancer Neg Hx     Past Surgical History:  Procedure Laterality Date  . COLONOSCOPY    . corneal transplant surgery left eye    . FOOT SURGERY     hammer toe and bone spur  . LUMBAR DISC SURGERY    . LUMBAR LAMINECTOMY     Social History   Occupational History  . Occupation: Scientist, clinical (histocompatibility and immunogenetics): [employer]  Tobacco Use  . Smoking status: Current Every Day Smoker    Packs/day: 1.00    Years: 54.00    Pack years: 54.00    Types: Cigarettes    Start date: 07/07/1960  . Smokeless tobacco: Never Used  . Tobacco comment: used to smoke 3 ppd - currently smoking 1 ppd - started smoking at age 73.  Substance and  Sexual Activity  . Alcohol use: Yes    Alcohol/week: 16.8 oz    Types: 28 Shots of liquor per week    Comment: Patient thinks he self medicates with alcohol for back pain. Does not drink if taking hydrocodone- 2 scothches a day   . Drug use: No  . Sexual activity: Not on file

## 2017-09-02 ENCOUNTER — Other Ambulatory Visit: Payer: Self-pay | Admitting: Adult Health

## 2017-09-03 ENCOUNTER — Encounter: Payer: Self-pay | Admitting: Family Medicine

## 2017-09-03 MED ORDER — LOSARTAN POTASSIUM-HCTZ 100-25 MG PO TABS: 1.0000 | ORAL_TABLET | Freq: Every day | ORAL | 0 refills | Status: DC

## 2017-09-03 MED ORDER — ESCITALOPRAM OXALATE 20 MG PO TABS: 20.0000 mg | ORAL_TABLET | Freq: Every day | ORAL | 0 refills | Status: DC

## 2017-09-03 MED ORDER — POTASSIUM CHLORIDE ER 10 MEQ PO TBCR: 10.0000 meq | EXTENDED_RELEASE_TABLET | Freq: Every day | ORAL | 0 refills | Status: DC

## 2017-09-03 NOTE — Telephone Encounter (Signed)
Pt due for cpx and lab work.

## 2017-09-03 NOTE — Telephone Encounter (Signed)
Error

## 2017-09-03 NOTE — Telephone Encounter (Signed)
Drew Burke w/[facility] [phone]  Calling to f/u on RX requests

## 2017-09-06 ENCOUNTER — Other Ambulatory Visit: Payer: Self-pay | Admitting: Adult Health

## 2017-09-08 NOTE — Telephone Encounter (Signed)
Last OV: 08/18/17 Last filled: 07/21/17, #90, 0 RF Sig: TAKE 1 TABLET BY MOUTH 3 TIMES A DAY  UDS: Not on file within last year

## 2017-09-18 ENCOUNTER — Telehealth: Payer: Self-pay | Admitting: Adult Health

## 2017-09-18 NOTE — Telephone Encounter (Signed)
Copied from [place]. Topic: Quick Communication - See Telephone Encounter >> Sep 18, 2017  2:24 PM Cleaster Corin, NT wrote: CRM for notification. See Telephone encounter for:   09/18/17. Catlin B. [facility]ing [facility] to request verbal orders for pt. For 2 week 9 for PT. Unice Cobble can be reached at [phone]

## 2017-09-21 NOTE — Telephone Encounter (Signed)
Urban Gibson calling back to follow up on order request. Phone number is  [phone]  Please note this is the correct number

## 2017-09-22 NOTE — Telephone Encounter (Signed)
Spoke to [place] and informed her to proceed with orders per [facility].

## 2017-09-22 NOTE — Telephone Encounter (Signed)
Ok for verbal orders ?

## 2017-09-25 ENCOUNTER — Telehealth: Payer: Self-pay | Admitting: Adult Health

## 2017-09-25 NOTE — Telephone Encounter (Signed)
Spoke to [place] and advised the pt be seen.  No availability with Tommi Rumps.  Katelyn to speak to [facility] and will have pt taken to UC or ED.

## 2017-09-25 NOTE — Telephone Encounter (Signed)
If he is not feeling right he needs to be seen, possibly in the ER. He never has been able to grasp the concept of checking his blood sugars, this is not new. The confusion is concerning

## 2017-09-25 NOTE — Telephone Encounter (Signed)
Copied from [place] [id number]. Topic: General - Other >> Sep 25, 2017 11:38 AM Conception Chancy, NT wrote: Drew Burke is calling from [facility], she is a physical therapist. She states the patient was really confused today and was telling the PTA that he did not feel right. She states he did not even know how to check his blood sugar today. Also states the PTA noticed that the legs were swollen and that is new for the patient. Physical therapist states PTA has also already left the patient house. She would like to know what they should do next. She is contacting the family now.  Drew Burke [phone]

## 2017-09-30 ENCOUNTER — Telehealth: Payer: Self-pay | Admitting: Adult Health

## 2017-09-30 NOTE — Telephone Encounter (Signed)
Drew Burke notified to proceed with services.

## 2017-09-30 NOTE — Telephone Encounter (Signed)
Copied from [place]. Topic: Quick Communication - See Telephone Encounter >> Sep 30, 2017 10:24 AM Corie Chiquito, NT wrote: CRM for notification. Ma4ry is calling from [facility] because she would like to know if they could have skilled nursing to go along with the patient Pt. Stated that he will need to have diabatic  management due to the fact that he is not monitoring his blood sugar levels. If someone could give her a call back about this at [phone]

## 2017-09-30 NOTE — Telephone Encounter (Signed)
That is fine 

## 2017-10-02 ENCOUNTER — Ambulatory Visit (INDEPENDENT_AMBULATORY_CARE_PROVIDER_SITE_OTHER): Payer: Medicare Other | Admitting: Adult Health

## 2017-10-02 ENCOUNTER — Encounter: Payer: Self-pay | Admitting: Adult Health

## 2017-10-02 VITALS — BP 130/70 | HR 77 | Temp 98.3°F | Ht 73.5 in | Wt 247.0 lb

## 2017-10-02 DIAGNOSIS — E118 Type 2 diabetes mellitus with unspecified complications: Secondary | ICD-10-CM

## 2017-10-02 DIAGNOSIS — Z0000 Encounter for general adult medical examination without abnormal findings: Secondary | ICD-10-CM

## 2017-10-02 DIAGNOSIS — F418 Other specified anxiety disorders: Secondary | ICD-10-CM

## 2017-10-02 DIAGNOSIS — R29818 Other symptoms and signs involving the nervous system: Secondary | ICD-10-CM

## 2017-10-02 DIAGNOSIS — J439 Emphysema, unspecified: Secondary | ICD-10-CM | POA: Diagnosis not present

## 2017-10-02 DIAGNOSIS — Z1159 Encounter for screening for other viral diseases: Secondary | ICD-10-CM

## 2017-10-02 DIAGNOSIS — F17200 Nicotine dependence, unspecified, uncomplicated: Secondary | ICD-10-CM

## 2017-10-02 DIAGNOSIS — R4189 Other symptoms and signs involving cognitive functions and awareness: Secondary | ICD-10-CM

## 2017-10-02 DIAGNOSIS — Z125 Encounter for screening for malignant neoplasm of prostate: Secondary | ICD-10-CM | POA: Diagnosis not present

## 2017-10-02 DIAGNOSIS — I10 Essential (primary) hypertension: Secondary | ICD-10-CM

## 2017-10-02 DIAGNOSIS — E7800 Pure hypercholesterolemia, unspecified: Secondary | ICD-10-CM | POA: Diagnosis not present

## 2017-10-02 LAB — CBC WITH DIFFERENTIAL/PLATELET
Basophils Absolute: 0 10*3/uL (ref 0.0–0.1)
Basophils Relative: 0.2 % (ref 0.0–3.0)
EOS ABS: 0.1 10*3/uL (ref 0.0–0.7)
Eosinophils Relative: 0.6 % (ref 0.0–5.0)
HCT: 45.5 % (ref 39.0–52.0)
HEMOGLOBIN: 15.6 g/dL (ref 13.0–17.0)
LYMPHS ABS: 6.4 10*3/uL — AB (ref 0.7–4.0)
Lymphocytes Relative: 63 % — ABNORMAL HIGH (ref 12.0–46.0)
MCHC: 34.4 g/dL (ref 30.0–36.0)
MCV: 96.7 fl (ref 78.0–100.0)
MONO ABS: 0.6 10*3/uL (ref 0.1–1.0)
Monocytes Relative: 5.6 % (ref 3.0–12.0)
NEUTROS PCT: 30.6 % — AB (ref 43.0–77.0)
Neutro Abs: 3.1 10*3/uL (ref 1.4–7.7)
Platelets: 146 10*3/uL — ABNORMAL LOW (ref 150.0–400.0)
RBC: 4.7 Mil/uL (ref 4.22–5.81)
RDW: 12.9 % (ref 11.5–15.5)
WBC: 10.2 10*3/uL (ref 4.0–10.5)

## 2017-10-02 LAB — HEPATIC FUNCTION PANEL
ALBUMIN: 4.3 g/dL (ref 3.5–5.2)
ALK PHOS: 52 U/L (ref 39–117)
ALT: 22 U/L (ref 0–53)
AST: 14 U/L (ref 0–37)
BILIRUBIN TOTAL: 0.7 mg/dL (ref 0.2–1.2)
Bilirubin, Direct: 0.1 mg/dL (ref 0.0–0.3)
Total Protein: 6.5 g/dL (ref 6.0–8.3)

## 2017-10-02 LAB — BASIC METABOLIC PANEL
BUN: 16 mg/dL (ref 6–23)
CALCIUM: 9.7 mg/dL (ref 8.4–10.5)
CHLORIDE: 101 meq/L (ref 96–112)
CO2: 30 mEq/L (ref 19–32)
CREATININE: 0.94 mg/dL (ref 0.40–1.50)
GFR: 83.99 mL/min (ref >60.00)
GLUCOSE: 232 mg/dL — AB (ref 70–99)
Potassium: 3.8 mEq/L (ref 3.5–5.1)
SODIUM: 139 meq/L (ref 135–145)

## 2017-10-02 LAB — LIPID PANEL
CHOL/HDL RATIO: 4
CHOLESTEROL: 164 mg/dL (ref 0–200)
HDL: 44.4 mg/dL (ref >39.00)
LDL CALC: 89 mg/dL (ref 0–99)
NonHDL: 119.61
TRIGLYCERIDES: 152 mg/dL — AB (ref 0.0–149.0)
VLDL: 30.4 mg/dL (ref 0.0–40.0)

## 2017-10-02 LAB — TSH: TSH: 1.29 u[IU]/mL (ref 0.35–4.50)

## 2017-10-02 LAB — HEMOGLOBIN A1C: Hgb A1c MFr Bld: 8.1 % — ABNORMAL HIGH (ref 4.6–6.5)

## 2017-10-02 LAB — PSA: PSA: 0.86 ng/mL (ref 0.10–4.00)

## 2017-10-02 MED ORDER — MIRTAZAPINE 7.5 MG PO TABS: 7.5000 mg | ORAL_TABLET | Freq: Every day | ORAL | 1 refills | Status: DC

## 2017-10-02 NOTE — Progress Notes (Signed)
Subjective:    Patient ID: Drew Burke, male    DOB: [birth year], 72 y.o.   MRN: [id number]  HPI  Patient presents for yearly preventative medicine examination. He is a pleasant 72 year old male who  has a past medical history of Allergic rhinitis, Allergy, Aneurysm ([place]), Anxiety, COPD (chronic obstructive pulmonary disease) ([place]), Depression, DM type 2 (diabetes mellitus, type 2) ([place]), Hyperlipidemia, Hypertension, and Tobacco abuse.  He takes simvastatin and a daily baby aspirin for history of hyperlipidemia Lab Results  Component Value Date   CHOL 164 10/02/2017   HDL 44.40 10/02/2017   LDLCALC 89 10/02/2017   LDLDIRECT 105.8 04/02/2009   TRIG 152.0 (H) 10/02/2017   CHOLHDL 4 10/02/2017    He takes Lexapro 20 mg for depression and valium 5 mg TID PRN for anxiety.  He reports that he is very rarely using Valium 5 mg 3 times daily and mostly uses it daily as needed for anxiety.  Reports that his depression has been becoming progressively worse, he very rarely leaves the house and spends most of his days on the couch watching TV.  He does feel lonely but does not want to go outside.  He denies any suicidal ideation also reports that he does not sleep well at night he will often sleep for 3-4 hours every night, but does nap during the day.  Does not check his blood sugars on a consistent basis, if at all despite having the [place] system him  He takes metformin 1000 mg twice daily for history of diabetes mellitus Lab Results  Component Value Date   HGBA1C 8.1 (H) 10/02/2017   He takes Hyzaar 100-25 mg for history of hypertension BP Readings from Last 3 Encounters:  10/02/17 130/70  07/28/17 126/72  07/22/17 109/69   Uses Brio Ellipta for history of COPD  Cognitive Impairment -feels as though his memory is slowly declining.  He often misplaces items around the house, to check his blood sugar, and is often feeling confused.  He has not gotten lost while driving, but reports that  he drives very little and if he does drive its short distances.  Denies any history of dementia and his family.  He has reported that he quit drinking alcohol approximately 2 months ago.  He is concerned about his memory loss.  All immunizations and health maintenance protocols were reviewed with the patient and needed orders were placed.  He is up-to-date on all vaccinations  Appropriate screening laboratory values were ordered for the patient including screening of hyperlipidemia, renal function and hepatic function. If indicated by BPH, a PSA was ordered.  Medication reconciliation,  past medical history, social history, problem list and allergies were reviewed in detail with the patient  Goals were established with regard to weight loss, exercise, and  diet in compliance with medications  End of life planning was discussed.  He has an advanced directive and living will  He is up-to-date on routine colonoscopy.  He does not participate in routine dental and vision screens  Fortunately, he continues to smoke.  He has no wishes to quit smoking at this time despite standing the risks that smoking brings  He has been working with PT for gait instability. He is enjoying this and feels as though it is beneficial.     Review of Systems  Constitutional: Positive for activity change, appetite change and fatigue.  HENT: Negative.   Eyes: Negative.   Respiratory: Negative.   Cardiovascular: Positive for leg  swelling.  Gastrointestinal: Negative.   Endocrine: Negative.   Genitourinary: Negative.   Musculoskeletal: Positive for arthralgias and back pain.  Skin: Negative.   Allergic/Immunologic: Negative.   Neurological: Positive for weakness.  Hematological: Negative.   Psychiatric/Behavioral: Positive for confusion, decreased concentration and sleep disturbance. Negative for suicidal ideas. The patient is nervous/anxious.   All other systems reviewed and are negative.  Past Medical  History:  Diagnosis Date  . Allergic rhinitis   . Allergy    pt denies  . Aneurysm ([place])   . Anxiety   . COPD (chronic obstructive pulmonary disease) ([place])   . Depression   . DM type 2 (diabetes mellitus, type 2) ([place])   . Hyperlipidemia   . Hypertension   . Tobacco abuse     Social History   Socioeconomic History  . Marital status: Divorced    Spouse name: Not on file  . Number of children: Not on file  . Years of education: Not on file  . Highest education level: Not on file  Occupational History  . Occupation: Scientist, clinical (histocompatibility and immunogenetics): [employer]  . Financial resource strain: Not on file  . Food insecurity:    Worry: Not on file    Inability: Not on file  . Transportation needs:    Medical: Not on file    Non-medical: Not on file  Tobacco Use  . Smoking status: Current Every Day Smoker    Packs/day: 1.00    Years: 54.00    Pack years: 54.00    Types: Cigarettes    Start date: 07/07/1960  . Smokeless tobacco: Never Used  . Tobacco comment: used to smoke 3 ppd - currently smoking 1 ppd - started smoking at age 28.  Substance and Sexual Activity  . Alcohol use: Yes    Alcohol/week: 16.8 oz    Types: 28 Shots of liquor per week    Comment: Patient thinks he self medicates with alcohol for back pain. Does not drink if taking hydrocodone- 2 scothches a day   . Drug use: No  . Sexual activity: Not on file  Lifestyle  . Physical activity:    Days per week: Not on file    Minutes per session: Not on file  . Stress: Not on file  Relationships  . Social connections:    Talks on phone: Not on file    Gets together: Not on file    Attends religious service: Not on file    Active member of club or organization: Not on file    Attends meetings of clubs or organizations: Not on file    Relationship status: Not on file  . Intimate partner violence:    Fear of current or ex partner: Not on file    Emotionally abused: Not on file    Physically abused:  Not on file    Forced sexual activity: Not on file  Other Topics Concern  . Not on file  Social History Narrative   Retired - Diplomatic Services operational officer by himself   Has a dog    Past Surgical History:  Procedure Laterality Date  . COLONOSCOPY    . corneal transplant surgery left eye    . FOOT SURGERY     hammer toe and bone spur  . LUMBAR DISC SURGERY    . LUMBAR LAMINECTOMY      Family History  Problem Relation Age of Onset  . Depression Other   . Hypertension Other   .  Heart disease Father   . Alcohol abuse Father   . Cancer Mother        intestines-duodenal cancer vs stomach per pt.  . Stomach cancer Mother   . Colon cancer Neg Hx   . Colon polyps Neg Hx   . Esophageal cancer Neg Hx   . Rectal cancer Neg Hx     No Known Allergies  Current Outpatient Medications on File Prior to Visit  Medication Sig Dispense Refill  . albuterol (PROVENTIL HFA;VENTOLIN HFA) 108 (90 Base) MCG/ACT inhaler TAKE 2 PUFFS BY MOUTH EVERY 4 HOURS AS NEEDED FOR WHEEZE 8.5 Inhaler 0  . aspirin 81 MG tablet Take 81 mg by mouth daily.      . citalopram (CELEXA) 40 MG tablet Take 1 tablet (40 mg total) by mouth daily. 90 tablet 1  . Continuous Blood Gluc Receiver (FREESTYLE LIBRE READER) DEVI 1 Device by Does not apply route daily. USE TO TEST BLOOD GLUCOSE ONCE DAILY 1 Device 0  . Continuous Blood Gluc Sensor (FREESTYLE LIBRE SENSOR SYSTEM) MISC USE TO TEST BLOOD GLUCOSE ONCE DAILY 1 each 0  . Cyanocobalamin (VITAMIN B 12 PO) Take 2,000 mcg by mouth daily.    . diazepam (VALIUM) 5 MG tablet TAKE 1 TABLET BY MOUTH 3 TIMES A DAY 90 tablet 0  . fluticasone furoate-vilanterol (BREO ELLIPTA) 100-25 MCG/INH AEPB Inhale 1 puff into the lungs daily. 28 each 11  . losartan-hydrochlorothiazide (HYZAAR) 100-25 MG tablet Take 1 tablet by mouth daily. 90 tablet 0  . metFORMIN (GLUCOPHAGE) 1000 MG tablet TAKE 1 TABLET BY MOUTH TWICE A DAY 180 tablet 0  . ONETOUCH DELICA LANCETS FINE MISC Test twice daily. 100 each 5  .  ONETOUCH VERIO test strip CHECK TWICE DAILY. 100 each 3  . potassium chloride (KLOR-CON 10) 10 MEQ tablet Take 1 tablet (10 mEq total) by mouth daily. 90 tablet 0  . Probiotic Product (ALIGN PO) Take 1 capsule by mouth daily.    . simvastatin (ZOCOR) 40 MG tablet TAKE 1 TABLET BY MOUTH AT BEDTIME 90 tablet 3  . triamcinolone cream (KENALOG) 0.5 % APPLY TOPICALLY TWICE A DAY 30 g 1   No current facility-administered medications on file prior to visit.     BP 130/70   Pulse 77   Temp 98.3 F (36.8 C) (Oral)   Ht 6' 1.5" (1.867 m)   Wt 247 lb (112 kg)   SpO2 96%   BMI 32.15 kg/m       Objective:   Physical Exam  Constitutional: He is oriented to person, place, and time. He appears well-developed and well-nourished. No distress.  HENT:  Head: Normocephalic and atraumatic.  Right Ear: External ear normal.  Left Ear: External ear normal.  Nose: Nose normal.  Mouth/Throat: Oropharynx is clear and moist. No oropharyngeal exudate.  Eyes: Pupils are equal, round, and reactive to light. Conjunctivae and EOM are normal. Right eye exhibits no discharge. Left eye exhibits no discharge. No scleral icterus.  Neck: Normal range of motion. Neck supple. No JVD present. No tracheal deviation present. No thyromegaly present.  Cardiovascular: Normal rate, regular rhythm, normal heart sounds and intact distal pulses. Exam reveals no gallop and no friction rub.  No murmur heard. Pulmonary/Chest: Effort normal. No stridor. No respiratory distress. He has wheezes. He has no rales. He exhibits no tenderness.  Abdominal: Soft. Bowel sounds are normal. He exhibits no distension and no mass. There is no tenderness. There is no rebound and no guarding.  Musculoskeletal: Normal range of motion. He exhibits edema (Lateral +1 pitting edema). He exhibits no tenderness or deformity.  Is able to get onto exam table with minimal assistance.  He does have a slow limping gait while walking  Lymphadenopathy:    He has  no cervical adenopathy.  Neurological: He is alert and oriented to person, place, and time. He has normal reflexes. He displays normal reflexes. No cranial nerve deficit. He exhibits normal muscle tone. Coordination normal.  Skin: Skin is warm and dry. No rash noted. He is not diaphoretic. No erythema. No pallor.  Discoloration to bilateral lower extremities  Psychiatric: He has a normal mood and affect. His speech is normal. Judgment and thought content normal. He is slowed and withdrawn. Cognition and memory are normal.  Nursing note and vitals reviewed.     Assessment & Plan:  1. Routine general medical examination at a health care facility - Basic metabolic panel - CBC with Differential/Platelet - Hemoglobin A1c - Hepatic function panel - TSH - Lipid panel  2. Pulmonary emphysema, unspecified emphysema type ([place]) -Continue with Brio  3. Depression with anxiety -Add Remeron 7.5 mg tablet to his regimen.  Advised him that this medication will make him sleepy so take when he is ready to go to bed.  Get up slowly in the morning.  I will have him follow-up in 1 month or sooner if needed.  Of course if he has any thoughts of suicide he is to go to the emergency room right away - mirtazapine (REMERON) 7.5 MG tablet; Take 1 tablet (7.5 mg total) by mouth at bedtime.  Dispense: 30 tablet; Refill: 1  4. Controlled diabetes mellitus type 2 with complications, unspecified whether long term insulin use ([place]) -Consider adding agent - Basic metabolic panel - CBC with Differential/Platelet - Hemoglobin A1c - Hepatic function panel - TSH - Lipid panel  5. Essential hypertension -Controlled on current medication.  No change in dose - Basic metabolic panel - CBC with Differential/Platelet - Hemoglobin A1c - Hepatic function panel - TSH - Lipid panel  6. Hyperlipidemia, group A -Consider increase in simvastatin -Urged heart healthy diet - Basic metabolic panel - CBC with  Differential/Platelet - Hemoglobin A1c - Hepatic function panel - TSH - Lipid panel  7. TOBACCO ABUSE -Encouraged to quit smoking  8. Prostate cancer screening  - PSA  9. Cognitive impairment -Likely factorial.  I am not discounting any issues with brain and will get MRI to rule out any abnormalities.  Believe most of his cognitive impairment is coming from depression and lack of stimulation in his environment.  He was advised to go outside and exercise as much as possible.  Ideally I would like him to join a support group.  He would be a great candidate for assisted living but he is unwilling to do this at this time - Ambulatory referral to Neurosurgery - MR Brain Wo Contrast; Future  10. Need for hepatitis C screening test  - Hep C Antibody  [name], NP

## 2017-10-02 NOTE — Patient Instructions (Signed)
I have prescribed a new medication for depression called Remeron. Take this at night. Do not take it with Valium   Someone will call you for Neuropsych testing   I am going to order a MRI of your brain - they will call you to schedule your exam   Please start using your cane and also start wearing compression socks during the day   Follow up with me in one month

## 2017-10-03 LAB — HEPATITIS C ANTIBODY
HEP C AB: NONREACTIVE
SIGNAL TO CUT-OFF: 0.01 (ref <1.00)

## 2017-10-07 ENCOUNTER — Telehealth: Payer: Self-pay | Admitting: Adult Health

## 2017-10-07 ENCOUNTER — Other Ambulatory Visit: Payer: Self-pay | Admitting: Family Medicine

## 2017-10-07 MED ORDER — GLIPIZIDE ER 2.5 MG PO TB24: 2.5000 mg | ORAL_TABLET | Freq: Two times a day (BID) | ORAL | 0 refills | Status: DC

## 2017-10-07 NOTE — Telephone Encounter (Signed)
I spoke with pt and gave the number to [place] [phone], the order is in Epic for MRI.

## 2017-10-07 NOTE — Telephone Encounter (Signed)
Copied from [place] [id number]. Topic: Referral - Status >> Oct 07, 2017  1:37 PM Hewitt Shorts wrote: CRM for notification. See Telephone encounter for: 10/07/17.pt is wanting to let the referral department know that he has not heard anything about his MRI appt

## 2017-10-09 ENCOUNTER — Telehealth: Payer: Self-pay | Admitting: Family Medicine

## 2017-10-09 ENCOUNTER — Other Ambulatory Visit: Payer: Self-pay | Admitting: Adult Health

## 2017-10-09 NOTE — Telephone Encounter (Signed)
Should pt decrease dose?

## 2017-10-09 NOTE — Telephone Encounter (Signed)
Pt notified to cut tab in half.  Will call back if needed.

## 2017-10-09 NOTE — Telephone Encounter (Signed)
Copied from [place]. Topic: General - Other >> Oct 09, 2017  2:21 PM Drew Burke wrote: Reason for CRM: Mirtazapine script for sleep works really good however it put him down so deep in sleep that he urinated on himself during the night instead of wake up to go to the bathroom. He would like a call back to discuss this.

## 2017-10-09 NOTE — Telephone Encounter (Signed)
He can break the pill in half

## 2017-10-13 ENCOUNTER — Ambulatory Visit
Admission: RE | Admit: 2017-10-13 | Discharge: 2017-10-13 | Disposition: A | Discharge status: Home / Self Care | Payer: Medicare Other | Source: Ambulatory Visit | Attending: Adult Health | Admitting: Adult Health

## 2017-10-13 DIAGNOSIS — R4189 Other symptoms and signs involving cognitive functions and awareness: Secondary | ICD-10-CM

## 2017-10-14 ENCOUNTER — Telehealth: Payer: Self-pay | Admitting: Family Medicine

## 2017-10-14 NOTE — Telephone Encounter (Signed)
Spoke to [place] and advised to proceed with orders.  Also gave verbal order for social work.  [name], AGNP-C notified and agreed.  No further action required.  Will close note.

## 2017-10-14 NOTE — Telephone Encounter (Signed)
Copied from [place] [id number]. Topic: General - Other >> Oct 14, 2017 10:59 AM Carolyn Stare wrote:  Drew Burke with [facility] call to ask to increase PT to 3 times a week for 5 weeks to insure safety and fall risk    [phone]

## 2017-10-14 NOTE — Telephone Encounter (Signed)
Verbal orders given  

## 2017-10-22 ENCOUNTER — Other Ambulatory Visit: Payer: Self-pay | Admitting: Adult Health

## 2017-10-26 NOTE — Telephone Encounter (Signed)
PT called to check on  Status of refill

## 2017-10-27 ENCOUNTER — Telehealth: Payer: Self-pay | Admitting: Adult Health

## 2017-10-27 NOTE — Telephone Encounter (Signed)
Copied from [place]. Topic: Quick Communication - See Telephone Encounter >> Oct 27, 2017 11:59 AM Boyd Kerbs wrote: CRM for notification.   Pt. Called said he went to the Neuropsychiatic and was there for 2 hours.  First told me he did not see anyone, but before he hung up said he did see a doctor but did not go over ct scans. Pt. Seemed confused  Please call pt regarding the ct scan results  See Telephone encounter for: 10/27/17.

## 2017-10-28 ENCOUNTER — Telehealth: Payer: Self-pay | Admitting: Adult Health

## 2017-10-28 ENCOUNTER — Telehealth: Payer: Self-pay | Admitting: Family Medicine

## 2017-10-28 NOTE — Telephone Encounter (Signed)
Copied from [place] [id number]. Topic: Quick Communication - See Telephone Encounter >> Oct 28, 2017 11:53 AM Hewitt Shorts wrote: CRM for notification. See Telephone encounter for: 10/28/17. MS. Bennett from [facility] is calling tio let the provider know that pt has declined in mental status and is having  Episodes of falling and is in fear that he is having possible Tias  Best number for ms bennet -[phone]

## 2017-10-28 NOTE — Telephone Encounter (Signed)
Copied from [place] [id number]. Topic: Inquiry >> Oct 28, 2017  9:40 AM Corie Chiquito, [place] wrote: Reason for CRM: Patient calling because he would like to speak with [facility] about his MRI. Stated that he is confused about what is going on. If he could give him a call back at [phone]

## 2017-10-28 NOTE — Telephone Encounter (Signed)
Spoke to patient and answered questions pertaining to MRI of brain

## 2017-10-29 ENCOUNTER — Other Ambulatory Visit: Payer: Self-pay | Admitting: Family Medicine

## 2017-10-29 DIAGNOSIS — F418 Other specified anxiety disorders: Secondary | ICD-10-CM

## 2017-10-29 MED ORDER — MIRTAZAPINE 7.5 MG PO TABS: 7.5000 mg | ORAL_TABLET | Freq: Every day | ORAL | 3 refills | Status: DC

## 2017-10-29 NOTE — Telephone Encounter (Signed)
Pharmacy requesting a 90 day supply.  Last filled 30 day supply.

## 2017-10-29 NOTE — Telephone Encounter (Signed)
Ok to refill 90 +3

## 2017-10-29 NOTE — Telephone Encounter (Signed)
Spoke to patient yesterday about his MRI. Awaiting recommendations from neuropsych

## 2017-10-29 NOTE — Telephone Encounter (Signed)
Sent to the pharmacy by e-scribe. 

## 2017-11-03 ENCOUNTER — Ambulatory Visit (INDEPENDENT_AMBULATORY_CARE_PROVIDER_SITE_OTHER): Payer: Medicare Other | Admitting: Adult Health

## 2017-11-03 ENCOUNTER — Encounter: Payer: Self-pay | Admitting: Adult Health

## 2017-11-03 VITALS — BP 136/80 | Temp 98.4°F | Wt 241.0 lb

## 2017-11-03 DIAGNOSIS — F339 Major depressive disorder, recurrent, unspecified: Secondary | ICD-10-CM | POA: Diagnosis not present

## 2017-11-03 DIAGNOSIS — Z8673 Personal history of transient ischemic attack (TIA), and cerebral infarction without residual deficits: Secondary | ICD-10-CM

## 2017-11-03 DIAGNOSIS — E1169 Type 2 diabetes mellitus with other specified complication: Secondary | ICD-10-CM | POA: Diagnosis not present

## 2017-11-03 DIAGNOSIS — Z794 Long term (current) use of insulin: Secondary | ICD-10-CM

## 2017-11-03 DIAGNOSIS — R4189 Other symptoms and signs involving cognitive functions and awareness: Secondary | ICD-10-CM

## 2017-11-03 NOTE — Progress Notes (Signed)
Subjective:    Patient ID: Drew Burke, male    DOB: 16-Aug-1945, 72 y.o.   MRN: [id number]  HPI  72 year old male who  has a past medical history of Allergic rhinitis, Allergy, Aneurysm ([place]), Anxiety, COPD (chronic obstructive pulmonary disease) ([place]), Depression, DM type 2 (diabetes mellitus, type 2) ([place]), Hyperlipidemia, Hypertension, and Tobacco abuse.  He presents to the office today for follow up regarding depression and insomnia. He was prescribed Remeron 7.5 mg nightly.  He reports that he took this for 1 night and then stopped it due to the medicine causing him to sleep so sound that he urinated in the bed.  No longer complains of insomnia.  Continues to complain of depression and is taking Cipro 20 mg daily.  Right now he does not want to do anything else for depression  Patient has been being seen by PT. He is doing PT 3 x a week for 3 weeks. Physical therapy does not feel as though patient is safe at home. He has a cane and walker at home. He is not using these devices at home.  Reports no falls within the last 2 weeks.  Additionally, he was seen by neuropsychiatry neuropsychiatry care center due to history of cognitive impairment.  I do not have the results of this exam.  May have been some confusion of why he was going there is he reports he thought he was going to get the read of the most recent MRI, which they did not review with him.  MRI showed:   IMPRESSION: Subcentimeter area of acute nonhemorrhagic infarction, RIGHT cerebellum. Evidence for chronic ischemia in the same vascular territory.  Atrophy and small vessel disease similar to prior CT.  Tiny focus of chronic hemorrhage in the RIGHT hemisphere, likely sequelae of hypertensive cerebrovascular disease.  He would also like to be referred to Podiatry for diabetic footcare  Review of Systems See HPI   Past Medical History:  Diagnosis Date  . Allergic rhinitis   . Allergy    pt denies  . Aneurysm  ([place])   . Anxiety   . COPD (chronic obstructive pulmonary disease) ([place])   . Depression   . DM type 2 (diabetes mellitus, type 2) ([place])   . Hyperlipidemia   . Hypertension   . Tobacco abuse     Social History   Socioeconomic History  . Marital status: Divorced    Spouse name: Not on file  . Number of children: Not on file  . Years of education: Not on file  . Highest education level: Not on file  Occupational History  . Occupation: Scientist, clinical (histocompatibility and immunogenetics): [employer]  . Financial resource strain: Not on file  . Food insecurity:    Worry: Not on file    Inability: Not on file  . Transportation needs:    Medical: Not on file    Non-medical: Not on file  Tobacco Use  . Smoking status: Current Every Day Smoker    Packs/day: 1.00    Years: 54.00    Pack years: 54.00    Types: Cigarettes    Start date: 07/07/1960  . Smokeless tobacco: Never Used  . Tobacco comment: used to smoke 3 ppd - currently smoking 1 ppd - started smoking at age 36.  Substance and Sexual Activity  . Alcohol use: Yes    Alcohol/week: 16.8 oz    Types: 28 Shots of liquor per week    Comment:  Patient thinks he self medicates with alcohol for back pain. Does not drink if taking hydrocodone- 2 scothches a day   . Drug use: No  . Sexual activity: Not on file  Lifestyle  . Physical activity:    Days per week: Not on file    Minutes per session: Not on file  . Stress: Not on file  Relationships  . Social connections:    Talks on phone: Not on file    Gets together: Not on file    Attends religious service: Not on file    Active member of club or organization: Not on file    Attends meetings of clubs or organizations: Not on file    Relationship status: Not on file  . Intimate partner violence:    Fear of current or ex partner: Not on file    Emotionally abused: Not on file    Physically abused: Not on file    Forced sexual activity: Not on file  Other Topics Concern  . Not on  file  Social History Narrative   Retired - Diplomatic Services operational officer by himself   Has a dog    Past Surgical History:  Procedure Laterality Date  . COLONOSCOPY    . corneal transplant surgery left eye    . FOOT SURGERY     hammer toe and bone spur  . LUMBAR DISC SURGERY    . LUMBAR LAMINECTOMY      Family History  Problem Relation Age of Onset  . Depression Other   . Hypertension Other   . Heart disease Father   . Alcohol abuse Father   . Cancer Mother        intestines-duodenal cancer vs stomach per pt.  . Stomach cancer Mother   . Colon cancer Neg Hx   . Colon polyps Neg Hx   . Esophageal cancer Neg Hx   . Rectal cancer Neg Hx     No Known Allergies  Current Outpatient Medications on File Prior to Visit  Medication Sig Dispense Refill  . albuterol (PROVENTIL HFA;VENTOLIN HFA) 108 (90 Base) MCG/ACT inhaler TAKE 2 PUFFS BY MOUTH EVERY 4 HOURS AS NEEDED FOR WHEEZE 8.5 Inhaler 0  . aspirin 81 MG tablet Take 81 mg by mouth daily.      . Continuous Blood Gluc Receiver (FREESTYLE LIBRE READER) DEVI 1 Device by Does not apply route daily. USE TO TEST BLOOD GLUCOSE ONCE DAILY 1 Device 0  . Continuous Blood Gluc Sensor (FREESTYLE LIBRE SENSOR SYSTEM) MISC USE TO TEST BLOOD GLUCOSE ONCE DAILY 1 each 0  . Cyanocobalamin (VITAMIN B 12 PO) Take 2,000 mcg by mouth daily.    . diazepam (VALIUM) 5 MG tablet TAKE 1 TABLET BY MOUTH 3 TIMES A DAY 90 tablet 0  . Escitalopram Oxalate (LEXAPRO PO) Take by mouth.    . fluticasone furoate-vilanterol (BREO ELLIPTA) 100-25 MCG/INH AEPB Inhale 1 puff into the lungs daily. 28 each 11  . glipiZIDE (GLUCOTROL XL) 2.5 MG 24 hr tablet Take 1 tablet (2.5 mg total) by mouth 2 (two) times daily. 180 tablet 0  . losartan-hydrochlorothiazide (HYZAAR) 100-25 MG tablet TAKE 1 TABLET BY MOUTH EVERY DAY 90 tablet 0  . metFORMIN (GLUCOPHAGE) 1000 MG tablet TAKE 1 TABLET BY MOUTH TWICE A DAY 180 tablet 0  . ONETOUCH DELICA LANCETS FINE MISC Test twice daily. 100 each 5  .  ONETOUCH VERIO test strip USE TO TEST BLOOD SUGAR TWICE DAILY 100 each 6  . potassium  chloride (KLOR-CON 10) 10 MEQ tablet Take 1 tablet (10 mEq total) by mouth daily. 90 tablet 0  . Probiotic Product (ALIGN PO) Take 1 capsule by mouth daily.    . simvastatin (ZOCOR) 40 MG tablet TAKE 1 TABLET BY MOUTH AT BEDTIME 90 tablet 3  . triamcinolone cream (KENALOG) 0.5 % APPLY TOPICALLY TWICE A DAY 30 g 1   No current facility-administered medications on file prior to visit.     BP 136/80 (BP Location: Left Arm)   Temp 98.4 F (36.9 C) (Oral)   Wt 241 lb (109.3 kg)   BMI 31.36 kg/m       Objective:   Physical Exam  Constitutional: He appears well-developed and well-nourished. No distress.  Eyes: Pupils are equal, round, and reactive to light. EOM are normal.  Cardiovascular: Normal rate, regular rhythm, normal heart sounds and intact distal pulses. Exam reveals no gallop and no friction rub.  No murmur heard. Pulmonary/Chest: Effort normal and breath sounds normal. No stridor. No respiratory distress. He has no wheezes. He has no rales. He exhibits no tenderness.  Musculoskeletal:  Slow shuffling gait    Skin: Skin is warm and dry. Capillary refill takes less than 2 seconds. No rash noted. He is not diaphoretic. No erythema.  Psychiatric: He has a normal mood and affect. His behavior is normal. Judgment and thought content normal.  Nursing note and vitals reviewed.     Assessment & Plan:  1. Controlled type 2 diabetes mellitus with other specified complication, with long-term current use of insulin ([place])  - Ambulatory referral to Podiatry  2. Cognitive impairment  - Ambulatory referral to Neurology  3. History of stroke involving cerebellum  - Ambulatory referral to Podiatry - I would like him to start using cane and walker at home   4. Depression, recurrent ([place]) - Continue with Lexapro  - Follow up as needed - Advised to go to the ER with any thoughts of SI  [name], NP

## 2017-11-04 ENCOUNTER — Encounter: Payer: Self-pay | Admitting: Neurology

## 2017-11-16 ENCOUNTER — Encounter: Payer: Self-pay | Admitting: Podiatry

## 2017-11-16 ENCOUNTER — Ambulatory Visit: Payer: Medicare Other | Admitting: Podiatry

## 2017-11-16 DIAGNOSIS — M79674 Pain in right toe(s): Secondary | ICD-10-CM | POA: Diagnosis not present

## 2017-11-16 DIAGNOSIS — M79675 Pain in left toe(s): Secondary | ICD-10-CM | POA: Diagnosis not present

## 2017-11-16 DIAGNOSIS — E1151 Type 2 diabetes mellitus with diabetic peripheral angiopathy without gangrene: Secondary | ICD-10-CM

## 2017-11-16 DIAGNOSIS — B351 Tinea unguium: Secondary | ICD-10-CM | POA: Diagnosis not present

## 2017-11-17 NOTE — Progress Notes (Signed)
Subjective:   Patient ID: Drew Burke, male   DOB: 72 y.o.   MRN: [id number]   HPI 72 year old male presents the office today requesting nail trimming the nails are thickened elongated he cannot trim himself.  He also notes that he does not have good circulation to his feet but he denies any claudication symptoms.  He denies any ulceration.  He has no other concerns today.  Review of Systems  All other systems reviewed and are negative.  Past Medical History:  Diagnosis Date  . Allergic rhinitis   . Allergy    pt denies  . Aneurysm ([place])   . Anxiety   . COPD (chronic obstructive pulmonary disease) ([place])   . Depression   . DM type 2 (diabetes mellitus, type 2) ([place])   . Hyperlipidemia   . Hypertension   . Tobacco abuse     Past Surgical History:  Procedure Laterality Date  . COLONOSCOPY    . corneal transplant surgery left eye    . FOOT SURGERY     hammer toe and bone spur  . LUMBAR DISC SURGERY    . LUMBAR LAMINECTOMY       Current Outpatient Medications:  .  albuterol (PROVENTIL HFA;VENTOLIN HFA) 108 (90 Base) MCG/ACT inhaler, TAKE 2 PUFFS BY MOUTH EVERY 4 HOURS AS NEEDED FOR WHEEZE, Disp: 8.5 Inhaler, Rfl: 0 .  aspirin 81 MG tablet, Take 81 mg by mouth daily.  , Disp: , Rfl:  .  Continuous Blood Gluc Receiver (FREESTYLE LIBRE READER) DEVI, 1 Device by Does not apply route daily. USE TO TEST BLOOD GLUCOSE ONCE DAILY, Disp: 1 Device, Rfl: 0 .  Continuous Blood Gluc Sensor (FREESTYLE LIBRE SENSOR SYSTEM) MISC, USE TO TEST BLOOD GLUCOSE ONCE DAILY, Disp: 1 each, Rfl: 0 .  Cyanocobalamin (VITAMIN B 12 PO), Take 2,000 mcg by mouth daily., Disp: , Rfl:  .  diazepam (VALIUM) 5 MG tablet, TAKE 1 TABLET BY MOUTH 3 TIMES A DAY, Disp: 90 tablet, Rfl: 0 .  Escitalopram Oxalate (LEXAPRO PO), Take by mouth., Disp: , Rfl:  .  fluticasone furoate-vilanterol (BREO ELLIPTA) 100-25 MCG/INH AEPB, Inhale 1 puff into the lungs daily., Disp: 28 each, Rfl: 11 .  glipiZIDE (GLUCOTROL XL) 2.5  MG 24 hr tablet, Take 1 tablet (2.5 mg total) by mouth 2 (two) times daily., Disp: 180 tablet, Rfl: 0 .  losartan-hydrochlorothiazide (HYZAAR) 100-25 MG tablet, TAKE 1 TABLET BY MOUTH EVERY DAY, Disp: 90 tablet, Rfl: 0 .  metFORMIN (GLUCOPHAGE) 1000 MG tablet, TAKE 1 TABLET BY MOUTH TWICE A DAY, Disp: 180 tablet, Rfl: 0 .  ONETOUCH DELICA LANCETS FINE MISC, Test twice daily., Disp: 100 each, Rfl: 5 .  ONETOUCH VERIO test strip, USE TO TEST BLOOD SUGAR TWICE DAILY, Disp: 100 each, Rfl: 6 .  potassium chloride (KLOR-CON 10) 10 MEQ tablet, Take 1 tablet (10 mEq total) by mouth daily., Disp: 90 tablet, Rfl: 0 .  Probiotic Product (ALIGN PO), Take 1 capsule by mouth daily., Disp: , Rfl:  .  simvastatin (ZOCOR) 40 MG tablet, TAKE 1 TABLET BY MOUTH AT BEDTIME, Disp: 90 tablet, Rfl: 3 .  triamcinolone cream (KENALOG) 0.5 %, APPLY TOPICALLY TWICE A DAY, Disp: 30 g, Rfl: 1  No Known Allergies  Social History   Socioeconomic History  . Marital status: Divorced    Spouse name: Not on file  . Number of children: Not on file  . Years of education: Not on file  . Highest education level: Not on  file  Occupational History  . Occupation: Scientist, clinical (histocompatibility and immunogenetics): [employer]  . Financial resource strain: Not on file  . Food insecurity:    Worry: Not on file    Inability: Not on file  . Transportation needs:    Medical: Not on file    Non-medical: Not on file  Tobacco Use  . Smoking status: Current Every Day Smoker    Packs/day: 1.00    Years: 54.00    Pack years: 54.00    Types: Cigarettes    Start date: 07/07/1960  . Smokeless tobacco: Never Used  . Tobacco comment: used to smoke 3 ppd - currently smoking 1 ppd - started smoking at age 18.  Substance and Sexual Activity  . Alcohol use: Yes    Alcohol/week: 16.8 oz    Types: 28 Shots of liquor per week    Comment: Patient thinks he self medicates with alcohol for back pain. Does not drink if taking hydrocodone- 2 scothches a  day   . Drug use: No  . Sexual activity: Not on file  Lifestyle  . Physical activity:    Days per week: Not on file    Minutes per session: Not on file  . Stress: Not on file  Relationships  . Social connections:    Talks on phone: Not on file    Gets together: Not on file    Attends religious service: Not on file    Active member of club or organization: Not on file    Attends meetings of clubs or organizations: Not on file    Relationship status: Not on file  . Intimate partner violence:    Fear of current or ex partner: Not on file    Emotionally abused: Not on file    Physically abused: Not on file    Forced sexual activity: Not on file  Other Topics Concern  . Not on file  Social History Narrative   Retired - Diplomatic Services operational officer by himself   Has a dog        Objective:  Physical Exam  General: AAO x3, NAD  Dermatological: Nails are hypertrophic, dystrophic, brittle, discolored, elongated 10. No surrounding redness or drainage. Tenderness nails 1-5 bilaterally. No open lesions or pre-ulcerative lesions are identified today.  Vascular: DP, PT pulses 1/4, CRT delayed, absent pedal hair.  Neruologic: Grossly intact via light touch bilateral. Protective threshold with Semmes Wienstein monofilament intact to all pedal sites bilateral.   Musculoskeletal: No gross boney pedal deformities bilateral. No pain, crepitus, or limitation noted with foot and ankle range of motion bilateral. Muscular strength 5/5 in all groups tested bilateral.      Assessment:   Symptomatic onychomycosis, PAD     Plan:  -Treatment options discussed including all alternatives, risks, and complications -Etiology of symptoms were discussed -Nails debrided 10 without complications or bleeding. -Currently denies any claudication symptoms we will continue to monitor. -Daily foot inspection -Follow-up in 3 months or sooner if any problems arise. In the meantime, encouraged to call the office with  any questions, concerns, change in symptoms.   Celesta Gentile, DPM

## 2017-12-03 ENCOUNTER — Other Ambulatory Visit: Payer: Self-pay | Admitting: Adult Health

## 2017-12-03 NOTE — Telephone Encounter (Signed)
Sent to the pharmacy by e-scribe for 90 days.  Pt has follow up on 02/03/18.

## 2017-12-03 NOTE — Telephone Encounter (Signed)
Sent to the pharmacy by e-scribe. 

## 2017-12-07 ENCOUNTER — Other Ambulatory Visit: Payer: Self-pay | Admitting: Adult Health

## 2017-12-08 NOTE — Telephone Encounter (Signed)
Sent to the pharmacy by e-scribe. 

## 2017-12-16 ENCOUNTER — Other Ambulatory Visit: Payer: Self-pay | Admitting: Adult Health

## 2017-12-16 NOTE — Telephone Encounter (Signed)
Sent to the pharmacy by e-scribe. 

## 2018-01-03 ENCOUNTER — Other Ambulatory Visit: Payer: Self-pay | Admitting: Adult Health

## 2018-01-05 NOTE — Telephone Encounter (Signed)
Sent to the pharmacy by e-scribe for 90 days.  Pt has upcoming appt on 02/03/18.

## 2018-01-25 ENCOUNTER — Other Ambulatory Visit: Payer: Self-pay | Admitting: Adult Health

## 2018-01-25 NOTE — Telephone Encounter (Signed)
Last OV 11/16/2017   Last refilled 09/08/2017 disp 90 with no refills   Sent to PCP to advise

## 2018-01-26 ENCOUNTER — Other Ambulatory Visit: Payer: Self-pay

## 2018-01-26 ENCOUNTER — Ambulatory Visit: Payer: Medicare Other | Admitting: Neurology

## 2018-01-26 ENCOUNTER — Encounter: Payer: Self-pay | Admitting: Neurology

## 2018-01-26 VITALS — BP 110/52 | HR 89 | Wt 241.0 lb

## 2018-01-26 DIAGNOSIS — E0842 Diabetes mellitus due to underlying condition with diabetic polyneuropathy: Secondary | ICD-10-CM | POA: Diagnosis not present

## 2018-01-26 DIAGNOSIS — F0390 Unspecified dementia without behavioral disturbance: Secondary | ICD-10-CM

## 2018-01-26 DIAGNOSIS — I63341 Cerebral infarction due to thrombosis of right cerebellar artery: Secondary | ICD-10-CM

## 2018-01-26 DIAGNOSIS — F03A0 Unspecified dementia, mild, without behavioral disturbance, psychotic disturbance, mood disturbance, and anxiety: Secondary | ICD-10-CM

## 2018-01-26 NOTE — Progress Notes (Signed)
NEUROLOGY CONSULTATION NOTE  Drew Burke MRN: [id number] DOB: 1945-11-29  Referring provider: [name], NP Primary care provider: [name], NP  Reason for consult:  Memory loss, stroke  Thank you for your kind referral of Drew Burke for consultation of the above symptoms. Although his history is well known to you, please allow me to reiterate it for the purpose of our medical record. The patient was accompanied to the clinic by his niece who also provides collateral information. Records and images were personally reviewed where available.  HISTORY OF PRESENT ILLNESS: This is a pleasant 72 year old right-handed man with a history of hypertension, hyperlipidemia, diabetes, presenting for evaluation of memory loss and stroke on MRI brain done 10/2017. He reported memory issues to his PCP in March 2019, misplacing items, forgetting to check his sugar levels. TSH was normal. He had an MRI brain without contrast on 10/13/17 which I personally reviewed showing a small 43mm focus of restricted diffusion in the right mid-cerebellum, consistent with acute infarction. There was a similar area of chronic infarct also on the right cerebellum. There was marked ventriculomegaly consistent with hydrocephalus ex vacuo, moderate chronic microvascular disease. He was referred for home PT, PCP was alerted of decline in mental status and episodes of falling. They felt he was not safe at home. He was also referred for Neuropsychological evaluation, he and his niece state he was seen by a psychiatrist one time and they all did not know why he was there. He had a fall in January and was on the kitchen floor all night until his cleaning lady came in the next morning and found him on the floor. They both report he stopped drinking 3 days prior to his fall in January. He was previously drinking half a gallon per week of scotch. When asked about memory, he states "it's been better." He noticed more changes  when he turned 72. He lives alone, his niece keeps a closer eye on him, ordering food delivery for him. He continues to drive locally without getting lost. After a fall in January, his niece started helping with medications, she noticed he was forgetting them. She reports she started getting more involved since January. She found out he was not paying bills toward the end of 2018, he did not get his car inspected. He reports he has "some depression that flows in and out." He stays home a lot. There is no family history of dementia.  He denies any headaches, dizziness, diplopia, dysarthria, dysphagia, back pain, focal numbness/tingling, bowel/bladder dysfunction. He has occasional tremors in both hands. He has right-sided neck pain, right shoulder pain. His legs feel weak and shaky, they report that his balance has improved with home PT.    Lab Results  Component Value Date   HGBA1C 8.1 (H) 10/02/2017   Lab Results  Component Value Date   CHOL 164 10/02/2017   HDL 44.40 10/02/2017   LDLCALC 89 10/02/2017   LDLDIRECT 105.8 04/02/2009   TRIG 152.0 (H) 10/02/2017   CHOLHDL 4 10/02/2017    Lab Results  Component Value Date   TSH 1.29 10/02/2017   Lab Results  Component Value Date   VITAMINB12 359 08/30/2015    PAST MEDICAL HISTORY: Past Medical History:  Diagnosis Date  . Allergic rhinitis   . Allergy    pt denies  . Aneurysm ([place])   . Anxiety   . COPD (chronic obstructive pulmonary disease) ([place])   . Depression   .  DM type 2 (diabetes mellitus, type 2) ([place])   . Hyperlipidemia   . Hypertension   . Tobacco abuse     PAST SURGICAL HISTORY: Past Surgical History:  Procedure Laterality Date  . COLONOSCOPY    . corneal transplant surgery left eye    . FOOT SURGERY     hammer toe and bone spur  . LUMBAR DISC SURGERY    . LUMBAR LAMINECTOMY      MEDICATIONS: Current Outpatient Medications on File Prior to Visit  Medication Sig Dispense Refill  . albuterol (PROVENTIL  HFA;VENTOLIN HFA) 108 (90 Base) MCG/ACT inhaler TAKE 2 PUFFS BY MOUTH EVERY 4 HOURS AS NEEDED FOR WHEEZE 8.5 Inhaler 0  . Continuous Blood Gluc Receiver (FREESTYLE LIBRE READER) DEVI 1 Device by Does not apply route daily. USE TO TEST BLOOD GLUCOSE ONCE DAILY 1 Device 0  . Continuous Blood Gluc Sensor (FREESTYLE LIBRE SENSOR SYSTEM) MISC USE TO TEST BLOOD GLUCOSE ONCE DAILY 1 each 0  . Cyanocobalamin (VITAMIN B 12 PO) Take 2,000 mcg by mouth daily.    . diazepam (VALIUM) 5 MG tablet TAKE 1 TABLET BY MOUTH THREE TIMES A DAY 90 tablet 0  . escitalopram (LEXAPRO) 20 MG tablet TAKE 1 TABLET BY MOUTH EVERY DAY 90 tablet 2  . Escitalopram Oxalate (LEXAPRO PO) Take by mouth.    . fluticasone furoate-vilanterol (BREO ELLIPTA) 100-25 MCG/INH AEPB Inhale 1 puff into the lungs daily. 28 each 11  . glipiZIDE (GLUCOTROL XL) 2.5 MG 24 hr tablet TAKE 1 TABLET BY MOUTH TWICE A DAY 180 tablet 0  . KLOR-CON 10 10 MEQ tablet TAKE 1 TABLET BY MOUTH EVERY DAY 90 tablet 2  . losartan-hydrochlorothiazide (HYZAAR) 100-25 MG tablet TAKE 1 TABLET BY MOUTH EVERY DAY 90 tablet 2  . metFORMIN (GLUCOPHAGE) 1000 MG tablet TAKE 1 TABLET BY MOUTH TWICE A DAY 180 tablet 0  . metFORMIN (GLUCOPHAGE) 1000 MG tablet TAKE 1 TABLET BY MOUTH TWICE A DAY 180 tablet 0  . ONETOUCH DELICA LANCETS FINE MISC Test twice daily. 100 each 5  . ONETOUCH VERIO test strip USE TO TEST BLOOD SUGAR TWICE DAILY 100 each 6  . Probiotic Product (ALIGN PO) Take 1 capsule by mouth daily.    . simvastatin (ZOCOR) 40 MG tablet TAKE 1 TABLET BY MOUTH EVERYDAY AT BEDTIME 90 tablet 2  . triamcinolone cream (KENALOG) 0.5 % APPLY TOPICALLY TWICE A DAY 30 g 1   No current facility-administered medications on file prior to visit.     ALLERGIES: No Known Allergies  FAMILY HISTORY: Family History  Problem Relation Age of Onset  . Depression Other   . Hypertension Other   . Heart disease Father   . Alcohol abuse Father   . Cancer Mother         intestines-duodenal cancer vs stomach per pt.  . Stomach cancer Mother   . Colon cancer Neg Hx   . Colon polyps Neg Hx   . Esophageal cancer Neg Hx   . Rectal cancer Neg Hx     SOCIAL HISTORY: Social History   Socioeconomic History  . Marital status: Divorced    Spouse name: Not on file  . Number of children: Not on file  . Years of education: Not on file  . Highest education level: Not on file  Occupational History  . Occupation: Scientist, clinical (histocompatibility and immunogenetics): [employer]  . Financial resource strain: Not on file  . Food insecurity:    Worry:  Not on file    Inability: Not on file  . Transportation needs:    Medical: Not on file    Non-medical: Not on file  Tobacco Use  . Smoking status: Current Every Day Smoker    Packs/day: 1.00    Years: 54.00    Pack years: 54.00    Types: Cigarettes    Start date: 07/07/1960  . Smokeless tobacco: Never Used  . Tobacco comment: used to smoke 3 ppd - currently smoking 1 ppd - started smoking at age 46.  Substance and Sexual Activity  . Alcohol use: Yes    Alcohol/week: 16.8 oz    Types: 28 Shots of liquor per week    Comment: Patient thinks he self medicates with alcohol for back pain. Does not drink if taking hydrocodone- 2 scothches a day   . Drug use: No  . Sexual activity: Not on file  Lifestyle  . Physical activity:    Days per week: Not on file    Minutes per session: Not on file  . Stress: Not on file  Relationships  . Social connections:    Talks on phone: Not on file    Gets together: Not on file    Attends religious service: Not on file    Active member of club or organization: Not on file    Attends meetings of clubs or organizations: Not on file    Relationship status: Not on file  . Intimate partner violence:    Fear of current or ex partner: Not on file    Emotionally abused: Not on file    Physically abused: Not on file    Forced sexual activity: Not on file  Other Topics Concern  . Not on file    Social History Narrative   Retired - Diplomatic Services operational officer by himself   Has a dog    REVIEW OF SYSTEMS: Constitutional: No fevers, chills, or sweats, no generalized fatigue, change in appetite Eyes: No visual changes, double vision, eye pain Ear, nose and throat: No hearing loss, ear pain, nasal congestion, sore throat Cardiovascular: No chest pain, palpitations Respiratory:  No shortness of breath at rest or with exertion, wheezes GastrointestinaI: No nausea, vomiting, diarrhea, abdominal pain, fecal incontinence Genitourinary:  No dysuria, urinary retention or frequency Musculoskeletal:  No neck pain, back pain Integumentary: No rash, pruritus, skin lesions Neurological: as above Psychiatric: No depression, insomnia, anxiety Endocrine: No palpitations, fatigue, diaphoresis, mood swings, change in appetite, change in weight, increased thirst Hematologic/Lymphatic:  No anemia, purpura, petechiae. Allergic/Immunologic: no itchy/runny eyes, nasal congestion, recent allergic reactions, rashes  PHYSICAL EXAM: Vitals:   01/26/18 1031  BP: (!) 110/52  Pulse: 89  SpO2: 96%   General: No acute distress Head:  Normocephalic/atraumatic Eyes: Fundoscopic exam shows bilateral sharp discs, no vessel changes, exudates, or hemorrhages Neck: supple, no paraspinal tenderness, full range of motion Back: No paraspinal tenderness Heart: regular rate and rhythm Lungs: Clear to auscultation bilaterally. Vascular: No carotid bruits. Skin/Extremities: No rash, no edema Neurological Exam: Mental status: alert and oriented to person, place, and time, no dysarthria or aphasia, Fund of knowledge is appropriate.  Recent and remote memory are intact.  Attention and concentration are normal.    Able to name objects and repeat phrases.  Montreal Cognitive Assessment  01/26/2018  Visuospatial/ Executive (0/5) 4  Naming (0/3) 3  Attention: Read list of digits (0/2) 2  Attention: Read list of letters (0/1) 1   Attention: Serial 7 subtraction starting  at 100 (0/3) 3  Language: Repeat phrase (0/2) 2  Language : Fluency (0/1) 1  Abstraction (0/2) 2  Delayed Recall (0/5) 0  Orientation (0/6) 5  Total 23   Cranial nerves: CN I: not tested CN II: pupils equal, round and reactive to light, visual fields intact, fundi unremarkable. CN III, IV, VI:  full range of motion, no nystagmus, no ptosis CN V: facial sensation intact CN VII: upper and lower face symmetric CN VIII: hearing intact to finger rub CN IX, X: gag intact, uvula midline CN XI: sternocleidomastoid and trapezius muscles intact CN XII: tongue midline Bulk & Tone: normal, no fasciculations. Motor: 5/5 throughout with no pronator drift. Sensation: intact to light touch, cold, pin on both UE, decreased cold and pin to both ankles, decreased vibration sense to both knees.  No extinction to double simultaneous stimulation.  Romberg test mild sway Deep Tendon Reflexes: +2 throughout except for absent ankle jerks bilaterally, no ankle clonus Plantar responses: downgoing bilaterally Cerebellar: no incoordination on finger to nose testing Gait: slightly wide-based, no ataxia, unable to tandem walk Tremor: + occasional right hand action tremor seen more when writing, no resting, postural, or endpoint tremor seen  IMPRESSION: This is a pleasant 72 year old right-handed man with a history of hypertension, hyperlipidemia, diabetes, presenting for evaluation of memory loss and stroke on MRI brain done 10/2017. 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 MRI brain showed a small stroke in the right cerebellum, there is a chronic stroke in this distribution.  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 Echocardiogram will be ordered to assess for central source. We discussed the importance of control of vascular risk factors for secondary stroke prevention and overall brain health, last HbA1c was 8.1, LDL 89. We discussed the importance of physical exercise and brain stimulation exercises for brain health. He knows to go to the ER for any sudden changes in symptoms. We discussed increasing supervision with his niece and having more aid at home. Follow-up in 6 months, he knows to call for any changes.   Thank you for allowing me to participate in the care of this patient. Please do not hesitate to call for any questions or concerns.   Ellouise Newer, M.D.  CC: [name], NP

## 2018-01-26 NOTE — Patient Instructions (Signed)
1. Schedule echocardiogram 2. Restart aspirin 81mg  daily 3. It is important to control blood pressure, cholesterol, and sugar levels, so it is very important to ensure we are taking medications regularly 4. For any sudden changes in symptoms, go to ER immediately 5. Follow-up in 6 months, call for any changes  FALL PRECAUTIONS: Be cautious when walking. Scan the area for obstacles that may increase the risk of trips and falls. When getting up in the mornings, sit up at the edge of the bed for a few minutes before getting out of bed. Consider elevating the bed at the head end to avoid drop of blood pressure when getting up. Walk always in a well-lit room (use night lights in the walls). Avoid area rugs or power cords from appliances in the middle of the walkways. Use a walker or a cane if necessary and consider physical therapy for balance exercise. Get your eyesight checked regularly.  FINANCIAL OVERSIGHT: Supervision, especially oversight when making financial decisions or transactions is also recommended.  HOME SAFETY: Consider the safety of the kitchen when operating appliances like stoves, microwave oven, and blender. Consider having supervision and share cooking responsibilities until no longer able to participate in those. Accidents with firearms and other hazards in the house should be identified and addressed as well.  DRIVING: Regarding driving, in patients with progressive memory problems, driving will be impaired. We advise to have someone else do the driving if trouble finding directions or if minor accidents are reported. Independent driving assessment is available to determine safety of driving.  ABILITY TO BE LEFT ALONE: If patient is unable to contact 911 operator, consider using LifeLine, or when the need is there, arrange for someone to stay with patients. Smoking is a fire hazard, consider supervision or cessation. Risk of wandering should be assessed by caregiver and if detected at  any point, supervision and safe proof recommendations should be instituted.  MEDICATION SUPERVISION: Inability to self-administer medication needs to be constantly addressed. Implement a mechanism to ensure safe administration of the medications.  RECOMMENDATIONS FOR ALL PATIENTS WITH MEMORY PROBLEMS: 1. Continue to exercise (Recommend 30 minutes of walking everyday, or 3 hours every week) 2. Increase social interactions - continue going to [place] and enjoy social gatherings with friends and family 3. Eat healthy, avoid fried foods and eat more fruits and vegetables 4. Maintain adequate blood pressure, blood sugar, and blood cholesterol level. Reducing the risk of stroke and cardiovascular disease also helps promoting better memory. 5. Avoid stressful situations. Live a simple life and avoid aggravations. Organize your time and prepare for the next day in anticipation. 6. Sleep well, avoid any interruptions of sleep and avoid any distractions in the bedroom that may interfere with adequate sleep quality 7. Avoid sugar, avoid sweets as there is a strong link between excessive sugar intake, diabetes, and cognitive impairment The Mediterranean diet has been shown to help patients reduce the risk of progressive memory disorders and reduces cardiovascular risk. This includes eating fish, eat fruits and green leafy vegetables, nuts like almonds and hazelnuts, walnuts, and also use olive oil. Avoid fast foods and fried foods as much as possible. Avoid sweets and sugar as sugar use has been linked to worsening of memory function.  There is always a concern of gradual progression of memory problems. If this is the case, then we may need to adjust level of care according to patient needs. Support, both to the patient and caregiver, should then be put into place.

## 2018-01-29 ENCOUNTER — Telehealth: Payer: Self-pay | Admitting: Neurology

## 2018-01-29 NOTE — Telephone Encounter (Signed)
Patient states that we were referring him to the [facility] and that they have not heard anything from [place] about a referral. He would like [place] to resend it and call him when it is done so he can call and make the appt

## 2018-01-29 NOTE — Telephone Encounter (Signed)
Called Cardiology on [facility] They left him a message on 01/27/18 to call them back at [phone] to schedule his Echo. I called patient back to let him know to call them directly to schedule and received vm. LMOM.

## 2018-01-29 NOTE — Telephone Encounter (Signed)
Patient called back and was made aware.

## 2018-02-03 ENCOUNTER — Encounter: Payer: Self-pay | Admitting: Adult Health

## 2018-02-03 ENCOUNTER — Ambulatory Visit (INDEPENDENT_AMBULATORY_CARE_PROVIDER_SITE_OTHER): Payer: Medicare Other | Admitting: Adult Health

## 2018-02-03 VITALS — BP 120/60 | Temp 98.5°F | Wt 245.0 lb

## 2018-02-03 DIAGNOSIS — E1169 Type 2 diabetes mellitus with other specified complication: Secondary | ICD-10-CM

## 2018-02-03 LAB — POCT GLYCOSYLATED HEMOGLOBIN (HGB A1C): HBA1C, POC (CONTROLLED DIABETIC RANGE): 6.9 % (ref 0.0–7.0)

## 2018-02-03 NOTE — Patient Instructions (Signed)
It was great seeing you today   Your A1c was

## 2018-02-03 NOTE — Progress Notes (Signed)
Subjective:    Patient ID: Drew Burke, male    DOB: 04/14/1946, 72 y.o.   MRN: [id number]  HPI  72 year old male who  has a past medical history of Allergic rhinitis, Allergy, Aneurysm ([place]), Anxiety, COPD (chronic obstructive pulmonary disease) ([place]), Depression, DM type 2 (diabetes mellitus, type 2) ([place]), Hyperlipidemia, Hypertension, and Tobacco abuse. He presents to the office today for follow-up regarding diabetes.  His current regimen includes that of metformin 1000 mg twice daily and glipizide extended release 2.5 mg.    Lab Results  Component Value Date   HGBA1C 8.1 (H) 10/02/2017   Wt Readings from Last 3 Encounters:  02/03/18 245 lb (111.1 kg)  01/26/18 241 lb (109.3 kg)  11/03/17 241 lb (109.3 kg)   He reports no episodes of hypoglycemia. He does not check his blood sugars at al. We had set him up with the  [facility] system but he did not like this. His diet has not changed. He reports no falls since he was last seen   He will be having an echo later this week.   Review of Systems See HPI   Past Medical History:  Diagnosis Date  . Allergic rhinitis   . Allergy    pt denies  . Aneurysm ([place])   . Anxiety   . COPD (chronic obstructive pulmonary disease) ([place])   . Depression   . DM type 2 (diabetes mellitus, type 2) ([place])   . Hyperlipidemia   . Hypertension   . Tobacco abuse     Social History   Socioeconomic History  . Marital status: Divorced    Spouse name: Not on file  . Number of children: Not on file  . Years of education: Not on file  . Highest education level: Not on file  Occupational History  . Occupation: Scientist, clinical (histocompatibility and immunogenetics): [employer]  . Financial resource strain: Not on file  . Food insecurity:    Worry: Not on file    Inability: Not on file  . Transportation needs:    Medical: Not on file    Non-medical: Not on file  Tobacco Use  . Smoking status: Current Every Day Smoker    Packs/day: 1.00    Years: 54.00      Pack years: 54.00    Types: Cigarettes    Start date: 07/07/1960  . Smokeless tobacco: Never Used  . Tobacco comment: used to smoke 3 ppd - currently smoking 1 ppd - started smoking at age 19.  Substance and Sexual Activity  . Alcohol use: Yes    Alcohol/week: 16.8 oz    Types: 28 Shots of liquor per week    Comment: Patient thinks he self medicates with alcohol for back pain. Does not drink if taking hydrocodone- 2 scothches a day   . Drug use: No  . Sexual activity: Not on file  Lifestyle  . Physical activity:    Days per week: Not on file    Minutes per session: Not on file  . Stress: Not on file  Relationships  . Social connections:    Talks on phone: Not on file    Gets together: Not on file    Attends religious service: Not on file    Active member of club or organization: Not on file    Attends meetings of clubs or organizations: Not on file    Relationship status: Not on file  . Intimate partner violence:  Fear of current or ex partner: Not on file    Emotionally abused: Not on file    Physically abused: Not on file    Forced sexual activity: Not on file  Other Topics Concern  . Not on file  Social History Narrative   Retired - Diplomatic Services operational officer by himself   Has a dog    Past Surgical History:  Procedure Laterality Date  . COLONOSCOPY    . corneal transplant surgery left eye    . FOOT SURGERY     hammer toe and bone spur  . LUMBAR DISC SURGERY    . LUMBAR LAMINECTOMY      Family History  Problem Relation Age of Onset  . Depression Other   . Hypertension Other   . Heart disease Father   . Alcohol abuse Father   . Cancer Mother        intestines-duodenal cancer vs stomach per pt.  . Stomach cancer Mother   . Colon cancer Neg Hx   . Colon polyps Neg Hx   . Esophageal cancer Neg Hx   . Rectal cancer Neg Hx     No Known Allergies  Current Outpatient Medications on File Prior to Visit  Medication Sig Dispense Refill  . albuterol (PROVENTIL  HFA;VENTOLIN HFA) 108 (90 Base) MCG/ACT inhaler TAKE 2 PUFFS BY MOUTH EVERY 4 HOURS AS NEEDED FOR WHEEZE 8.5 Inhaler 0  . aspirin EC 81 MG tablet Take 81 mg by mouth daily.    . Continuous Blood Gluc Receiver (FREESTYLE LIBRE READER) DEVI 1 Device by Does not apply route daily. USE TO TEST BLOOD GLUCOSE ONCE DAILY 1 Device 0  . Continuous Blood Gluc Sensor (FREESTYLE LIBRE SENSOR SYSTEM) MISC USE TO TEST BLOOD GLUCOSE ONCE DAILY 1 each 0  . Cyanocobalamin (VITAMIN B 12 PO) Take 2,000 mcg by mouth daily.    . diazepam (VALIUM) 5 MG tablet TAKE 1 TABLET BY MOUTH THREE TIMES A DAY 90 tablet 0  . escitalopram (LEXAPRO) 20 MG tablet TAKE 1 TABLET BY MOUTH EVERY DAY 90 tablet 2  . Escitalopram Oxalate (LEXAPRO PO) Take by mouth.    . fluticasone furoate-vilanterol (BREO ELLIPTA) 100-25 MCG/INH AEPB Inhale 1 puff into the lungs daily. 28 each 11  . glipiZIDE (GLUCOTROL XL) 2.5 MG 24 hr tablet TAKE 1 TABLET BY MOUTH TWICE A DAY 180 tablet 0  . KLOR-CON 10 10 MEQ tablet TAKE 1 TABLET BY MOUTH EVERY DAY 90 tablet 2  . losartan-hydrochlorothiazide (HYZAAR) 100-25 MG tablet TAKE 1 TABLET BY MOUTH EVERY DAY 90 tablet 2  . metFORMIN (GLUCOPHAGE) 1000 MG tablet TAKE 1 TABLET BY MOUTH TWICE A DAY 180 tablet 0  . metFORMIN (GLUCOPHAGE) 1000 MG tablet TAKE 1 TABLET BY MOUTH TWICE A DAY 180 tablet 0  . ONETOUCH DELICA LANCETS FINE MISC Test twice daily. 100 each 5  . ONETOUCH VERIO test strip USE TO TEST BLOOD SUGAR TWICE DAILY 100 each 6  . Probiotic Product (ALIGN PO) Take 1 capsule by mouth daily.    . simvastatin (ZOCOR) 40 MG tablet TAKE 1 TABLET BY MOUTH EVERYDAY AT BEDTIME 90 tablet 2  . triamcinolone cream (KENALOG) 0.5 % APPLY TOPICALLY TWICE A DAY 30 g 1   No current facility-administered medications on file prior to visit.     BP 120/60   Temp 98.5 F (36.9 C) (Oral)   Wt 245 lb (111.1 kg)   BMI 31.89 kg/m       Objective:  Physical Exam  Constitutional: He is oriented to person, place,  and time. He appears well-developed and well-nourished. No distress.  Eyes: Pupils are equal, round, and reactive to light. Conjunctivae and EOM are normal. Right eye exhibits no discharge. Left eye exhibits no discharge. No scleral icterus.  Cardiovascular: Normal rate, regular rhythm, normal heart sounds and intact distal pulses. Exam reveals no gallop and no friction rub.  No murmur heard. Pulmonary/Chest: Effort normal. No stridor. No respiratory distress. He has wheezes. He has no rales. He exhibits no tenderness.  Abdominal: No hernia.  Musculoskeletal: Normal range of motion. He exhibits no edema, tenderness or deformity.  Neurological: He is alert and oriented to person, place, and time. No cranial nerve deficit. Coordination normal.  Skin: Skin is warm and dry. No rash noted. He is not diaphoretic. No erythema. No pallor.  Psychiatric: He has a normal mood and affect. His behavior is normal. Judgment and thought content normal.  Nursing note and vitals reviewed.      Assessment & Plan:   1. Controlled type 2 diabetes mellitus with other specified complication, without long-term current use of insulin (HCC) - POCT A1C- 6.9  - Has improved  - Will make no medication changes at this time  - Follow up in 3 months or sooner if needed   [name], NP

## 2018-02-04 ENCOUNTER — Other Ambulatory Visit: Payer: Self-pay

## 2018-02-04 ENCOUNTER — Ambulatory Visit (HOSPITAL_COMMUNITY): Payer: Medicare Other | Attending: Cardiology

## 2018-02-04 DIAGNOSIS — I10 Essential (primary) hypertension: Secondary | ICD-10-CM | POA: Insufficient documentation

## 2018-02-04 DIAGNOSIS — E0842 Diabetes mellitus due to underlying condition with diabetic polyneuropathy: Secondary | ICD-10-CM | POA: Diagnosis present

## 2018-02-04 DIAGNOSIS — I63341 Cerebral infarction due to thrombosis of right cerebellar artery: Secondary | ICD-10-CM

## 2018-02-04 DIAGNOSIS — I517 Cardiomegaly: Secondary | ICD-10-CM | POA: Diagnosis not present

## 2018-02-04 DIAGNOSIS — G3184 Mild cognitive impairment, so stated: Secondary | ICD-10-CM | POA: Insufficient documentation

## 2018-02-04 DIAGNOSIS — E785 Hyperlipidemia, unspecified: Secondary | ICD-10-CM | POA: Insufficient documentation

## 2018-02-05 ENCOUNTER — Encounter: Payer: Self-pay | Admitting: Neurology

## 2018-02-09 ENCOUNTER — Telehealth: Payer: Self-pay

## 2018-02-09 NOTE — Telephone Encounter (Signed)
LMOM asking for return call.  Did not relay message below.

## 2018-02-09 NOTE — Telephone Encounter (Signed)
-----   Message from Cameron Sprang, MD sent at 02/09/2018 10:16 AM EDT ----- Pls let him know the echocardiogram did not show any evidence of clot. It did show changes seen with high blood pressure, so very important to control BP better. Thanks

## 2018-02-15 ENCOUNTER — Ambulatory Visit: Payer: Medicare Other | Admitting: Podiatry

## 2018-02-15 DIAGNOSIS — M79674 Pain in right toe(s): Secondary | ICD-10-CM | POA: Diagnosis not present

## 2018-02-15 DIAGNOSIS — B351 Tinea unguium: Secondary | ICD-10-CM | POA: Diagnosis not present

## 2018-02-15 DIAGNOSIS — M79675 Pain in left toe(s): Secondary | ICD-10-CM

## 2018-02-15 DIAGNOSIS — E1151 Type 2 diabetes mellitus with diabetic peripheral angiopathy without gangrene: Secondary | ICD-10-CM | POA: Diagnosis not present

## 2018-02-16 NOTE — Progress Notes (Signed)
Subjective: 72 y.o. returns the office today for painful, elongated, thickened toenails which they cannot trim themself. Denies any redness or drainage around the nails. Denies any acute changes since last appointment and no new complaints today. Denies any systemic complaints such as fevers, chills, nausea, vomiting.   PCP: [name], NP Last Seen: 02/03/2018  A1c: 8.1  Objective: AAO 3, NAD DP/PT pulses 1/4, CRT delayed Nails hypertrophic, dystrophic, elongated, brittle, discolored 10. There is tenderness overlying the nails 1-5 bilaterally. There is no surrounding erythema or drainage along the nail sites. No open lesions or pre-ulcerative lesions are identified. No other areas of tenderness bilateral lower extremities. No overlying edema, erythema, increased warmth. No pain with calf compression, swelling, warmth, erythema.  Assessment: Patient presents with symptomatic onychomycosis  Plan: -Treatment options including alternatives, risks, complications were discussed -Nails sharply debrided 10 without complication/bleeding. -Discussed daily foot inspection. If there are any changes, to call the office immediately.  -Follow-up in 3 months or sooner if any problems are to arise. In the meantime, encouraged to call the office with any questions, concerns, changes symptoms.  Celesta Gentile, DPM

## 2018-03-02 ENCOUNTER — Other Ambulatory Visit: Payer: Self-pay | Admitting: Adult Health

## 2018-03-02 NOTE — Telephone Encounter (Signed)
This medication was filled on 02/26/18.

## 2018-04-05 ENCOUNTER — Other Ambulatory Visit: Payer: Self-pay | Admitting: Adult Health

## 2018-04-06 NOTE — Telephone Encounter (Signed)
Sent to the pharmacy by e-scribe.  Pt is scheduled for follow up on 05/05/18.

## 2018-04-12 ENCOUNTER — Other Ambulatory Visit: Payer: Self-pay | Admitting: Adult Health

## 2018-04-27 ENCOUNTER — Other Ambulatory Visit: Payer: Self-pay | Admitting: Adult Health

## 2018-04-27 DIAGNOSIS — J42 Unspecified chronic bronchitis: Secondary | ICD-10-CM

## 2018-04-28 NOTE — Telephone Encounter (Signed)
Sent to the pharmacy by e-scribe. 

## 2018-05-05 ENCOUNTER — Ambulatory Visit (INDEPENDENT_AMBULATORY_CARE_PROVIDER_SITE_OTHER): Payer: Medicare Other | Admitting: Adult Health

## 2018-05-05 ENCOUNTER — Encounter: Payer: Self-pay | Admitting: Adult Health

## 2018-05-05 VITALS — BP 126/62 | Temp 98.0°F | Wt 254.0 lb

## 2018-05-05 DIAGNOSIS — E1169 Type 2 diabetes mellitus with other specified complication: Secondary | ICD-10-CM | POA: Diagnosis not present

## 2018-05-05 LAB — POCT GLYCOSYLATED HEMOGLOBIN (HGB A1C): HbA1c, POC (controlled diabetic range): 7.1 % — AB (ref 0.0–7.0)

## 2018-05-05 MED ORDER — METFORMIN HCL 1000 MG PO TABS: 1000.0000 mg | ORAL_TABLET | Freq: Two times a day (BID) | ORAL | 1 refills | Status: DC

## 2018-05-05 NOTE — Patient Instructions (Signed)
It was great seeing you today   Your A1c is up a little from 6.9 to 7.1 - Continue with same medication dose   Your weight is up 9 pounds over the last 3 months. Please work on this.   I will see you in April for your physical

## 2018-05-05 NOTE — Progress Notes (Signed)
Subjective:    Patient ID: Drew Burke, male    DOB: 01/23/1946, 73 y.o.   MRN: [id number]  HPI  72 year old male who  has a past medical history of Allergic rhinitis, Allergy, Aneurysm ([place]), Anxiety, COPD (chronic obstructive pulmonary disease) ([place]), Depression, DM type 2 (diabetes mellitus, type 2) ([place]), Hyperlipidemia, Hypertension, and Tobacco abuse.  He presents to the office today for three month follow up regarding DM. His current regimen includes that of Metformin 1000 mg BID and Glipzide ER 2.5 mg. He does not check his blood sugars at home. He denies any symptoms of hypoglycemia.   He has been living a pretty sednetary lifestyle at home. His niece checks in on him multiples times throughout the week and she is ordering his groceries for him. He denies any recent falls.   Lab Results  Component Value Date   HGBA1C 6.9 02/03/2018   Review of Systems See HPI   Past Medical History:  Diagnosis Date  . Allergic rhinitis   . Allergy    pt denies  . Aneurysm ([place])   . Anxiety   . COPD (chronic obstructive pulmonary disease) ([place])   . Depression   . DM type 2 (diabetes mellitus, type 2) ([place])   . Hyperlipidemia   . Hypertension   . Tobacco abuse     Social History   Socioeconomic History  . Marital status: Divorced    Spouse name: Not on file  . Number of children: Not on file  . Years of education: Not on file  . Highest education level: Not on file  Occupational History  . Occupation: Scientist, clinical (histocompatibility and immunogenetics): [employer]  . Financial resource strain: Not on file  . Food insecurity:    Worry: Not on file    Inability: Not on file  . Transportation needs:    Medical: Not on file    Non-medical: Not on file  Tobacco Use  . Smoking status: Current Every Day Smoker    Packs/day: 1.00    Years: 54.00    Pack years: 54.00    Types: Cigarettes    Start date: 07/07/1960  . Smokeless tobacco: Never Used  . Tobacco comment: used to smoke 3  ppd - currently smoking 1 ppd - started smoking at age 79.  Substance and Sexual Activity  . Alcohol use: Yes    Alcohol/week: 28.0 standard drinks    Types: 28 Shots of liquor per week    Comment: Patient thinks he self medicates with alcohol for back pain. Does not drink if taking hydrocodone- 2 scothches a day   . Drug use: No  . Sexual activity: Not on file  Lifestyle  . Physical activity:    Days per week: Not on file    Minutes per session: Not on file  . Stress: Not on file  Relationships  . Social connections:    Talks on phone: Not on file    Gets together: Not on file    Attends religious service: Not on file    Active member of club or organization: Not on file    Attends meetings of clubs or organizations: Not on file    Relationship status: Not on file  . Intimate partner violence:    Fear of current or ex partner: Not on file    Emotionally abused: Not on file    Physically abused: Not on file    Forced sexual activity: Not on  file  Other Topics Concern  . Not on file  Social History Narrative   Retired - Diplomatic Services operational officer by himself   Has a dog    Past Surgical History:  Procedure Laterality Date  . COLONOSCOPY    . corneal transplant surgery left eye    . FOOT SURGERY     hammer toe and bone spur  . LUMBAR DISC SURGERY    . LUMBAR LAMINECTOMY      Family History  Problem Relation Age of Onset  . Depression Other   . Hypertension Other   . Heart disease Father   . Alcohol abuse Father   . Cancer Mother        intestines-duodenal cancer vs stomach per pt.  . Stomach cancer Mother   . Colon cancer Neg Hx   . Colon polyps Neg Hx   . Esophageal cancer Neg Hx   . Rectal cancer Neg Hx     No Known Allergies  Current Outpatient Medications on File Prior to Visit  Medication Sig Dispense Refill  . albuterol (PROVENTIL HFA;VENTOLIN HFA) 108 (90 Base) MCG/ACT inhaler TAKE 2 PUFFS BY MOUTH EVERY 4 HOURS AS NEEDED FOR WHEEZE 8.5 Inhaler 0  . aspirin EC  81 MG tablet Take 81 mg by mouth daily.    Marland Kitchen BREO ELLIPTA 100-25 MCG/INH AEPB TAKE 1 PUFF BY MOUTH EVERY DAY 60 each 5  . Cyanocobalamin (VITAMIN B 12 PO) Take 2,000 mcg by mouth daily.    . diazepam (VALIUM) 5 MG tablet TAKE 1 TABLET BY MOUTH THREE TIMES A DAY 90 tablet 0  . escitalopram (LEXAPRO) 20 MG tablet TAKE 1 TABLET BY MOUTH EVERY DAY 90 tablet 2  . glipiZIDE (GLUCOTROL XL) 2.5 MG 24 hr tablet TAKE 1 TABLET BY MOUTH TWICE A DAY 180 tablet 0  . KLOR-CON 10 10 MEQ tablet TAKE 1 TABLET BY MOUTH EVERY DAY 90 tablet 2  . losartan-hydrochlorothiazide (HYZAAR) 100-25 MG tablet TAKE 1 TABLET BY MOUTH EVERY DAY 90 tablet 2  . metFORMIN (GLUCOPHAGE) 1000 MG tablet TAKE 1 TABLET BY MOUTH TWICE A DAY 180 tablet 0  . ONETOUCH DELICA LANCETS FINE MISC Test twice daily. 100 each 5  . ONETOUCH VERIO test strip USE TO TEST BLOOD SUGAR TWICE DAILY 100 each 6  . Probiotic Product (ALIGN PO) Take 1 capsule by mouth daily.    . simvastatin (ZOCOR) 40 MG tablet TAKE 1 TABLET BY MOUTH EVERYDAY AT BEDTIME 90 tablet 2  . triamcinolone cream (KENALOG) 0.5 % APPLY TOPICALLY TWICE A DAY 30 g 1   No current facility-administered medications on file prior to visit.     BP 126/62   Temp 98 F (36.7 C)   Wt 254 lb (115.2 kg)   BMI 33.06 kg/m       Objective:   Physical Exam  Constitutional: He is oriented to person, place, and time. He appears well-developed and well-nourished. No distress.  Cardiovascular: Normal rate, regular rhythm, normal heart sounds and intact distal pulses.  Pulmonary/Chest: Effort normal and breath sounds normal.  Neurological: He is alert and oriented to person, place, and time.  Skin: Skin is warm and dry. He is not diaphoretic.  Psychiatric: He has a normal mood and affect. His behavior is normal. Judgment and thought content normal.  Nursing note and vitals reviewed.     Assessment & Plan:  1. Controlled type 2 diabetes mellitus with other specified complication, without  long-term current use of insulin ([place]) -  POCT A1C- 7.1 - Has increased form 6.9  - No change in medications at this time. - He will follow up at his CPE in [place], NP

## 2018-05-18 ENCOUNTER — Ambulatory Visit: Payer: Medicare Other | Admitting: Podiatry

## 2018-06-24 ENCOUNTER — Emergency Department (HOSPITAL_COMMUNITY)
Admission: EM | Admit: 2018-06-24 | Discharge: 2018-06-25 | Disposition: A | Discharge status: Home / Self Care | Payer: Medicare Other | Attending: Emergency Medicine | Admitting: Emergency Medicine

## 2018-06-24 ENCOUNTER — Other Ambulatory Visit: Payer: Self-pay

## 2018-06-24 ENCOUNTER — Encounter (HOSPITAL_COMMUNITY): Payer: Self-pay

## 2018-06-24 ENCOUNTER — Emergency Department (HOSPITAL_COMMUNITY): Payer: Medicare Other

## 2018-06-24 DIAGNOSIS — Z7984 Long term (current) use of oral hypoglycemic drugs: Secondary | ICD-10-CM | POA: Insufficient documentation

## 2018-06-24 DIAGNOSIS — I10 Essential (primary) hypertension: Secondary | ICD-10-CM | POA: Insufficient documentation

## 2018-06-24 DIAGNOSIS — R531 Weakness: Secondary | ICD-10-CM | POA: Diagnosis present

## 2018-06-24 DIAGNOSIS — E119 Type 2 diabetes mellitus without complications: Secondary | ICD-10-CM | POA: Diagnosis not present

## 2018-06-24 DIAGNOSIS — W19XXXA Unspecified fall, initial encounter: Secondary | ICD-10-CM

## 2018-06-24 DIAGNOSIS — F17210 Nicotine dependence, cigarettes, uncomplicated: Secondary | ICD-10-CM | POA: Insufficient documentation

## 2018-06-24 DIAGNOSIS — Z79899 Other long term (current) drug therapy: Secondary | ICD-10-CM | POA: Diagnosis not present

## 2018-06-24 DIAGNOSIS — J449 Chronic obstructive pulmonary disease, unspecified: Secondary | ICD-10-CM | POA: Insufficient documentation

## 2018-06-24 DIAGNOSIS — Z7982 Long term (current) use of aspirin: Secondary | ICD-10-CM | POA: Insufficient documentation

## 2018-06-24 LAB — COMPREHENSIVE METABOLIC PANEL
ALBUMIN: 3.9 g/dL (ref 3.5–5.0)
ALT: 24 U/L (ref 0–44)
AST: 32 U/L (ref 15–41)
Alkaline Phosphatase: 49 U/L (ref 38–126)
Anion gap: 13 (ref 5–15)
BUN: 14 mg/dL (ref 8–23)
CO2: 29 mmol/L (ref 22–32)
Calcium: 9.5 mg/dL (ref 8.9–10.3)
Chloride: 100 mmol/L (ref 98–111)
Creatinine, Ser: 1.15 mg/dL (ref 0.61–1.24)
GFR calc Af Amer: >60 mL/min (ref >60)
GFR calc non Af Amer: >60 mL/min (ref >60)
Glucose, Bld: 135 mg/dL — ABNORMAL HIGH (ref 70–99)
POTASSIUM: 3.9 mmol/L (ref 3.5–5.1)
Sodium: 142 mmol/L (ref 135–145)
Total Bilirubin: 1.8 mg/dL — ABNORMAL HIGH (ref 0.3–1.2)
Total Protein: 6.6 g/dL (ref 6.5–8.1)

## 2018-06-24 LAB — CBC WITH DIFFERENTIAL/PLATELET
Abs Immature Granulocytes: 0.03 10*3/uL (ref 0.00–0.07)
BASOS ABS: 0 10*3/uL (ref 0.0–0.1)
Basophils Relative: 0 %
Eosinophils Absolute: 0 10*3/uL (ref 0.0–0.5)
Eosinophils Relative: 0 %
HCT: 44.7 % (ref 39.0–52.0)
Hemoglobin: 14.9 g/dL (ref 13.0–17.0)
IMMATURE GRANULOCYTES: 0 %
Lymphocytes Relative: 46 %
Lymphs Abs: 5.1 10*3/uL — ABNORMAL HIGH (ref 0.7–4.0)
MCH: 32.7 pg (ref 26.0–34.0)
MCHC: 33.3 g/dL (ref 30.0–36.0)
MCV: 98 fL (ref 80.0–100.0)
Monocytes Absolute: 1.1 10*3/uL — ABNORMAL HIGH (ref 0.1–1.0)
Monocytes Relative: 9 %
NEUTROS PCT: 45 %
Neutro Abs: 5.2 10*3/uL (ref 1.7–7.7)
Platelets: 150 10*3/uL (ref 150–400)
RBC: 4.56 MIL/uL (ref 4.22–5.81)
RDW: 13 % (ref 11.5–15.5)
WBC: 11.5 10*3/uL — ABNORMAL HIGH (ref 4.0–10.5)
nRBC: 0 % (ref 0.0–0.2)

## 2018-06-24 LAB — I-STAT TROPONIN, ED: Troponin i, poc: 0.03 ng/mL (ref 0.00–0.08)

## 2018-06-24 LAB — CK: Total CK: 745 U/L — ABNORMAL HIGH (ref 49–397)

## 2018-06-24 MED ORDER — SODIUM CHLORIDE 0.9 % IV BOLUS
1000.0000 mL | Freq: Once | INTRAVENOUS | Status: AC
  Administered 2018-06-24: 1000 mL via INTRAVENOUS

## 2018-06-24 NOTE — ED Provider Notes (Signed)
[facility] EMERGENCY DEPARTMENT Provider Note   CSN: [id number] Arrival date & time: 06/24/18  2143     History   Chief Complaint Chief Complaint  Patient presents with  . Weakness  . Fall    HPI Drew Burke is a 72 y.o. male.  Patient presents to the emergency department with a chief complaint of weakness and fall.  He states that he fell yesterday and has been unable to get up off of the ground since then.  He ultimately called EMS today and was brought to the hospital.  He denies hitting his head when he fell.  He thinks that he lost his balance.  He denies any chest pain or shortness of breath.  He reports that he has had a cough, but denies any fever.  Denies any abdominal pain.  Denies any pain in his extremities.  He has not taken anything for symptoms.  He states that his balance has been off for the past several months, and he was told that he might have had a mini stroke in the past.  He lives at home alone.  The history is provided by the patient. No language interpreter was used.    Past Medical History:  Diagnosis Date  . Allergic rhinitis   . Allergy    pt denies  . Aneurysm ([place])   . Anxiety   . COPD (chronic obstructive pulmonary disease) ([place])   . Depression   . DM type 2 (diabetes mellitus, type 2) ([place])   . Hyperlipidemia   . Hypertension   . Tobacco abuse     Patient Active Problem List   Diagnosis Date Noted  . Complete tear of right rotator cuff 08/18/2017  . Acute pain of right shoulder 08/04/2017  . Coronary artery disease due to calcified coronary lesion 06/15/2014  . COPD with emphysema ([place]) 06/15/2014  . Cancer screening 06/08/2014  . Back pain, chronic 04/06/2013  . Diabetes type 2, controlled ([place]) 08/05/2011  . 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 Anxiety state 01/19/2008  . TOBACCO ABUSE 01/19/2008  . Depression with anxiety 01/19/2008  . Hyperlipidemia, group A 12/09/2007   . Essential hypertension 12/09/2007  . Allergic rhinitis 12/09/2007    Past Surgical History:  Procedure Laterality Date  . COLONOSCOPY    . corneal transplant surgery left eye    . FOOT SURGERY     hammer toe and bone spur  . LUMBAR DISC SURGERY    . LUMBAR LAMINECTOMY          Home Medications    Prior to Admission medications   Medication Sig Start Date End Date Taking? Authorizing Provider  diazepam (VALIUM) 5 MG tablet TAKE 1 TABLET BY MOUTH THREE TIMES A DAY 04/15/18  Yes Nafziger, Tommi Rumps, NP  glipiZIDE (GLUCOTROL XL) 2.5 MG 24 hr tablet TAKE 1 TABLET BY MOUTH TWICE A DAY 04/06/18  Yes Nafziger, Tommi Rumps, NP  metFORMIN (GLUCOPHAGE) 1000 MG tablet Take 1 tablet (1,000 mg total) by mouth 2 (two) times daily. 05/05/18  Yes Nafziger, Tommi Rumps, NP  albuterol (PROVENTIL HFA;VENTOLIN HFA) 108 (90 Base) MCG/ACT inhaler TAKE 2 PUFFS BY MOUTH EVERY 4 HOURS AS NEEDED FOR WHEEZE 04/02/17   Nafziger, Tommi Rumps, NP  aspirin EC 81 MG tablet Take 81 mg by mouth daily.    [provider]  BREO ELLIPTA 100-25 MCG/INH AEPB TAKE 1 PUFF BY MOUTH EVERY DAY 04/28/18   Nafziger, Tommi Rumps, NP  Cyanocobalamin (VITAMIN B 12 PO)  Take 2,000 mcg by mouth daily.    [provider]  escitalopram (LEXAPRO) 20 MG tablet TAKE 1 TABLET BY MOUTH EVERY DAY 12/03/17   Nafziger, Tommi Rumps, NP  KLOR-CON 10 10 MEQ tablet TAKE 1 TABLET BY MOUTH EVERY DAY 12/03/17   Nafziger, Tommi Rumps, NP  losartan-hydrochlorothiazide (HYZAAR) 100-25 MG tablet TAKE 1 TABLET BY MOUTH EVERY DAY 12/16/17   Nafziger, Tommi Rumps, NP  [facility] DELICA LANCETS FINE MISC Test twice daily. 01/04/15   Nafziger, Tommi Rumps, NP  ONETOUCH VERIO test strip USE TO TEST BLOOD SUGAR TWICE DAILY 10/09/17   Nafziger, Tommi Rumps, NP  Probiotic Product (ALIGN PO) Take 1 capsule by mouth daily.    [provider]  simvastatin (ZOCOR) 40 MG tablet TAKE 1 TABLET BY MOUTH EVERYDAY AT BEDTIME Patient taking differently: Take 40 mg by mouth at bedtime.  12/08/17   Nafziger, Tommi Rumps, NP    triamcinolone cream (KENALOG) 0.5 % APPLY TOPICALLY TWICE A DAY 04/29/17   Nafziger, Tommi Rumps, NP    Family History Family History  Problem Relation Age of Onset  . Depression Other   . Hypertension Other   . Heart disease Father   . Alcohol abuse Father   . Cancer Mother        intestines-duodenal cancer vs stomach per pt.  . Stomach cancer Mother   . Colon cancer Neg Hx   . Colon polyps Neg Hx   . Esophageal cancer Neg Hx   . Rectal cancer Neg Hx     Social History Social History   Tobacco Use  . Smoking status: Current Every Day Smoker    Packs/day: 1.00    Years: 54.00    Pack years: 54.00    Types: Cigarettes    Start date: 07/07/1960  . Smokeless tobacco: Never Used  . Tobacco comment: used to smoke 3 ppd - currently smoking 1 ppd - started smoking at age 71.  Substance Use Topics  . Alcohol use: Yes    Alcohol/week: 28.0 standard drinks    Types: 28 Shots of liquor per week    Comment: Patient thinks he self medicates with alcohol for back pain. Does not drink if taking hydrocodone- 2 scothches a day   . Drug use: No     Allergies   Patient has no known allergies.   Review of Systems Review of Systems  All other systems reviewed and are negative.    Physical Exam Updated Vital Signs BP (!) 143/59 (BP Location: Right Arm)   Pulse 60   Temp 98.5 F (36.9 C) (Oral)   Resp 18   Ht 6\' 2"  (1.88 m)   Wt 115.2 kg   SpO2 97%   BMI 32.61 kg/m   Physical Exam Vitals signs and nursing note reviewed.  Constitutional:      Appearance: He is well-developed.  HENT:     Head: Normocephalic and atraumatic.  Eyes:     General: No scleral icterus.       Right eye: No discharge.        Left eye: No discharge.     Conjunctiva/sclera: Conjunctivae normal.     Pupils: Pupils are equal, round, and reactive to light.  Neck:     Musculoskeletal: Normal range of motion and neck supple.     Vascular: No JVD.  Cardiovascular:     Rate and Rhythm: Normal rate and  regular rhythm.     Heart sounds: Normal heart sounds. No murmur. No friction rub. No gallop.   Pulmonary:  Effort: Pulmonary effort is normal. No respiratory distress.     Breath sounds: Normal breath sounds. No wheezing or rales.  Chest:     Chest wall: No tenderness.  Abdominal:     General: There is no distension.     Palpations: Abdomen is soft. There is no mass.     Tenderness: There is no abdominal tenderness. There is no guarding or rebound.  Musculoskeletal: Normal range of motion.        General: No tenderness.  Skin:    General: Skin is warm and dry.  Neurological:     Mental Status: He is alert and oriented to person, place, and time.  Psychiatric:        Behavior: Behavior normal.        Thought Content: Thought content normal.        Judgment: Judgment normal.      ED Treatments / Results  Labs (all labs ordered are listed, but only abnormal results are displayed) Labs Reviewed  CBC WITH DIFFERENTIAL/PLATELET - Abnormal; Notable for the following components:      Result Value   WBC 11.5 (*)    Lymphs Abs 5.1 (*)    Monocytes Absolute 1.1 (*)    All other components within normal limits  COMPREHENSIVE METABOLIC PANEL - Abnormal; Notable for the following components:   Glucose, Bld 135 (*)    Total Bilirubin 1.8 (*)    All other components within normal limits  CK - Abnormal; Notable for the following components:   Total CK 745 (*)    All other components within normal limits  URINALYSIS, ROUTINE W REFLEX MICROSCOPIC - Abnormal; Notable for the following components:   APPearance HAZY (*)    Specific Gravity, Urine >1.030 (*)    Hgb urine dipstick TRACE (*)    Bilirubin Urine SMALL (*)    Ketones, ur 40 (*)    Protein, ur 30 (*)    All other components within normal limits  URINALYSIS, MICROSCOPIC (REFLEX) - Abnormal; Notable for the following components:   Bacteria, UA RARE (*)    All other components within normal limits  I-STAT TROPONIN, ED     EKG None  Radiology Dg Chest 2 View  Result Date: 06/24/2018 CLINICAL DATA:  Cough EXAM: CHEST - 2 VIEW COMPARISON:  07/22/2017 FINDINGS: No pleural effusion. No focal airspace disease. Borderline to mild cardiomegaly. No pneumothorax. IMPRESSION: No active cardiopulmonary disease.  Borderline cardiomegaly. Electronically Signed   By: Donavan Foil M.D.   On: 06/24/2018 22:46   Ct Head Wo Contrast  Result Date: 06/24/2018 CLINICAL DATA:  Head trauma EXAM: CT HEAD WITHOUT CONTRAST TECHNIQUE: Contiguous axial images were obtained from the base of the skull through the vertex without intravenous contrast. COMPARISON:  Head CT 07/22/2017 FINDINGS: Brain: There is no mass, hemorrhage or extra-axial collection. There is generalized atrophy without lobar predilection. The brain parenchyma is normal, without evidence of acute or chronic infarction. Vascular: No abnormal hyperdensity of the major intracranial arteries or dural venous sinuses. No intracranial atherosclerosis. Skull: The visualized skull base, calvarium and extracranial soft tissues are normal. Sinuses/Orbits: No fluid levels or advanced mucosal thickening of the visualized paranasal sinuses. No mastoid or middle ear effusion. The orbits are normal. IMPRESSION: Generalized atrophy, unchanged, without acute intracranial abnormality. Electronically Signed   By: Ulyses Jarred M.D.   On: 06/24/2018 23:34    Procedures Procedures (including critical care time)  Medications Ordered in ED Medications - No data to display  Initial Impression / Assessment and Plan / ED Course  I have reviewed the triage vital signs and the nursing notes.  Pertinent labs & imaging results that were available during my care of the patient were reviewed by me and considered in my medical decision making (see chart for details).     Patient with mechanical fall yesterday.  Thought to be balance related.  Patient complains of having balance problems for  the past several months.  His laboratory work-up is reassuring.  He did have a mildly elevated CK, but not significantly elevated to indicate rhabdomyolysis.  Patient given fluids in the ED.  Patient seen by and discussed with Dr. Roxanne Mins, who agrees with plan for discharge to home.  Patient is able to ambulate in the ED with a walker.  Have advised him to use one at home and to follow-up with his PCP and with outpatient neurology.  Final Clinical Impressions(s) / ED Diagnoses   Final diagnoses:  Fall, initial encounter  Generalized weakness    ED Discharge Orders         Ordered    Ambulatory referral to Neurology    Comments:  An appointment is requested in approximately: Weakness   06/25/18 0214           Montine Circle, PA-C 07/68/08 8110    Delora Fuel, MD 31[birth year]45 [phone]

## 2018-06-24 NOTE — ED Triage Notes (Signed)
Pt arrived via ems for fall yesterday.  Patient stated he has been in the floor since yesterday unable to get up.  Patient states he feel;s week.

## 2018-06-25 LAB — URINALYSIS, ROUTINE W REFLEX MICROSCOPIC
Glucose, UA: NEGATIVE mg/dL
Ketones, ur: 40 mg/dL — AB
Leukocytes, UA: NEGATIVE
NITRITE: NEGATIVE
Protein, ur: 30 mg/dL — AB
Specific Gravity, Urine: >1.03 — ABNORMAL HIGH (ref 1.005–1.030)
pH: 5.5 (ref 5.0–8.0)

## 2018-06-25 LAB — URINALYSIS, MICROSCOPIC (REFLEX): WBC UA: NONE SEEN WBC/hpf (ref 0–5)

## 2018-06-25 NOTE — ED Notes (Signed)
Ambulated well with a walker per Adrian(RN)

## 2018-06-25 NOTE — ED Notes (Signed)
PT states understanding of care given, follow up care, and medication prescribed. PT wheel chaired to cab at this time.

## 2018-06-28 ENCOUNTER — Other Ambulatory Visit: Payer: Self-pay | Admitting: Adult Health

## 2018-06-28 NOTE — Telephone Encounter (Signed)
Sent to the pharmacy by e-scribe for 6 months.  Has appt on 11/02/17 for cpx.

## 2018-09-04 ENCOUNTER — Other Ambulatory Visit: Payer: Self-pay | Admitting: Adult Health

## 2018-09-07 NOTE — Telephone Encounter (Signed)
Filled for 90 days.  Pt has upcoming cpx on 11/03/2018.

## 2018-09-09 ENCOUNTER — Other Ambulatory Visit: Payer: Self-pay | Admitting: Adult Health

## 2018-09-10 NOTE — Telephone Encounter (Signed)
Sent to the pharmacy by e-scribe for 90 days.  Pt has upcoming cpx in 10/2018.

## 2018-09-14 ENCOUNTER — Ambulatory Visit: Payer: Medicare Other | Admitting: Neurology

## 2018-09-17 ENCOUNTER — Other Ambulatory Visit: Payer: Self-pay | Admitting: Adult Health

## 2018-09-20 NOTE — Telephone Encounter (Signed)
Denied.  Filled on 09/10/2018 for 90 days.

## 2018-10-02 ENCOUNTER — Other Ambulatory Visit: Payer: Self-pay | Admitting: Adult Health

## 2018-10-04 NOTE — Telephone Encounter (Signed)
Sent to the pharmacy by e-scribe for 90 days.  Pt has upcoming cpx on 11/03/2018.

## 2018-11-03 ENCOUNTER — Encounter: Payer: Medicare Other | Admitting: Adult Health

## 2018-11-19 ENCOUNTER — Other Ambulatory Visit: Payer: Self-pay | Admitting: Adult Health

## 2018-11-22 NOTE — Telephone Encounter (Signed)
Spoke to the pt.  He does not feel that he is able to come in for lab work at this time.  Michela Pitcher he is having a hard time getting around.  Please advise.

## 2018-11-23 NOTE — Telephone Encounter (Signed)
Ok for 90 days  

## 2018-11-23 NOTE — Telephone Encounter (Signed)
Sent to the pharmacy by e-scribe. 

## 2018-12-09 ENCOUNTER — Other Ambulatory Visit: Payer: Self-pay | Admitting: Adult Health

## 2018-12-10 NOTE — Telephone Encounter (Signed)
Rx refill Last fill 09/07/18  #90/0 Last OV 05/05/18 Does patient need a visit first?

## 2019-01-31 ENCOUNTER — Encounter: Payer: Self-pay | Admitting: Gastroenterology

## 2019-02-22 ENCOUNTER — Other Ambulatory Visit: Payer: Self-pay | Admitting: Adult Health

## 2019-02-23 ENCOUNTER — Encounter: Payer: Self-pay | Admitting: Family Medicine

## 2019-02-23 NOTE — Telephone Encounter (Signed)
Sent to the pharmacy by e-scribe for 30 days.  Letter mailed to the pt.

## 2019-03-19 ENCOUNTER — Other Ambulatory Visit: Payer: Self-pay | Admitting: Adult Health

## 2019-04-12 ENCOUNTER — Other Ambulatory Visit: Payer: Self-pay | Admitting: Adult Health

## 2019-04-12 DIAGNOSIS — E1169 Type 2 diabetes mellitus with other specified complication: Secondary | ICD-10-CM

## 2019-04-13 NOTE — Telephone Encounter (Signed)
SENT TO THE PHARMACY BY E-SCRIBE. 

## 2019-04-15 ENCOUNTER — Other Ambulatory Visit: Payer: Self-pay | Admitting: Adult Health

## 2019-04-15 NOTE — Telephone Encounter (Signed)
Denied.  Needs cpx. 

## 2019-05-01 ENCOUNTER — Other Ambulatory Visit: Payer: Self-pay | Admitting: Adult Health

## 2019-05-04 ENCOUNTER — Telehealth: Payer: Self-pay | Admitting: Adult Health

## 2019-05-04 MED ORDER — METFORMIN HCL 1000 MG PO TABS: 1000.0000 mg | ORAL_TABLET | Freq: Two times a day (BID) | ORAL | 0 refills | Status: DC

## 2019-05-04 MED ORDER — SIMVASTATIN 40 MG PO TABS: 40.0000 mg | ORAL_TABLET | Freq: Every day | ORAL | 0 refills | Status: DC

## 2019-05-04 MED ORDER — LOSARTAN POTASSIUM-HCTZ 100-25 MG PO TABS: 1.0000 | ORAL_TABLET | Freq: Every day | ORAL | 0 refills | Status: DC

## 2019-05-04 MED ORDER — GLIPIZIDE ER 2.5 MG PO TB24: 2.5000 mg | ORAL_TABLET | Freq: Two times a day (BID) | ORAL | 0 refills | Status: DC

## 2019-05-04 NOTE — Telephone Encounter (Signed)
Pt has a cpe sch for 08-09-2018 and needs refills on losartan, simvastatin, metformin and glipizide. [facility] [address]

## 2019-05-04 NOTE — Telephone Encounter (Signed)
Sent to the pharmacy by e-scribe. 

## 2019-05-04 NOTE — Telephone Encounter (Signed)
Ok to refill until that time

## 2019-05-05 ENCOUNTER — Other Ambulatory Visit: Payer: Self-pay | Admitting: Adult Health

## 2019-05-05 NOTE — Telephone Encounter (Signed)
Sent to the pharmacy by e-scribe. 

## 2019-06-14 ENCOUNTER — Other Ambulatory Visit: Payer: Self-pay | Admitting: Adult Health

## 2019-06-15 NOTE — Telephone Encounter (Signed)
Sent to the pharmacy by e-scribe.  Pt has upcoming cpx. 

## 2019-06-25 ENCOUNTER — Other Ambulatory Visit: Payer: Self-pay | Admitting: Adult Health

## 2019-06-25 DIAGNOSIS — J42 Unspecified chronic bronchitis: Secondary | ICD-10-CM

## 2019-06-29 NOTE — Telephone Encounter (Signed)
Sent to the pharmacy by e-scribe.  Pt has upcoming cpx. 

## 2019-07-25 ENCOUNTER — Other Ambulatory Visit: Payer: Self-pay | Admitting: Adult Health

## 2019-07-25 DIAGNOSIS — J42 Unspecified chronic bronchitis: Secondary | ICD-10-CM

## 2019-08-05 ENCOUNTER — Telehealth: Payer: Self-pay | Admitting: Adult Health

## 2019-08-05 ENCOUNTER — Other Ambulatory Visit: Payer: Self-pay

## 2019-08-05 ENCOUNTER — Other Ambulatory Visit: Payer: Self-pay | Admitting: Adult Health

## 2019-08-05 DIAGNOSIS — J449 Chronic obstructive pulmonary disease, unspecified: Secondary | ICD-10-CM

## 2019-08-05 DIAGNOSIS — J42 Unspecified chronic bronchitis: Secondary | ICD-10-CM

## 2019-08-05 MED ORDER — ALBUTEROL SULFATE HFA 108 (90 BASE) MCG/ACT IN AERS: INHALATION_SPRAY | RESPIRATORY_TRACT | 0 refills | Status: AC

## 2019-08-05 MED ORDER — METFORMIN HCL 1000 MG PO TABS: 1000.0000 mg | ORAL_TABLET | Freq: Two times a day (BID) | ORAL | 0 refills | Status: AC

## 2019-08-05 MED ORDER — BREO ELLIPTA 100-25 MCG/INH IN AEPB: INHALATION_SPRAY | RESPIRATORY_TRACT | 0 refills | Status: AC

## 2019-08-05 MED ORDER — LOSARTAN POTASSIUM-HCTZ 100-25 MG PO TABS: 1.0000 | ORAL_TABLET | Freq: Every day | ORAL | 0 refills | Status: DC

## 2019-08-05 MED ORDER — SIMVASTATIN 40 MG PO TABS: 40.0000 mg | ORAL_TABLET | Freq: Every day | ORAL | 0 refills | Status: DC

## 2019-08-05 MED ORDER — ESCITALOPRAM OXALATE 20 MG PO TABS: 20.0000 mg | ORAL_TABLET | Freq: Every day | ORAL | 0 refills | Status: DC

## 2019-08-05 MED ORDER — GLIPIZIDE ER 2.5 MG PO TB24: 2.5000 mg | ORAL_TABLET | Freq: Two times a day (BID) | ORAL | 0 refills | Status: DC

## 2019-08-05 NOTE — Telephone Encounter (Signed)
Lm for POA advising pt POA that Rx sent in for 30 days.

## 2019-08-05 NOTE — Telephone Encounter (Signed)
Left message to return phone call.

## 2019-08-05 NOTE — Telephone Encounter (Signed)
Pt's POA, Lorrin Jackson, called about his medication for Potassium Chloride, Metformin, Simvastatin, Glipizide needing refilled.  Lorrin Jackson stated that he canceled his CPE that was scheduled for 08/10/2019 b/c he is afraid of catching COVID. Informed her that we do sanitize each room after every pt, wipe down counters after each interaction, and pre-screen everyone.  Ph[facility]: [facility] [address] [place], [place] [address] Murrell Redden can be reached at [phone]

## 2019-08-05 NOTE — Telephone Encounter (Signed)
Ok to send in 30 days

## 2019-08-05 NOTE — Telephone Encounter (Signed)
Spoke to pt POA and she advised that she does not know when pt wants to come in.I advised that even if we send in a a 30-90 day supply pt will need to make an appt. Soon so that we can get some update labs since he has not been seen since 04/2018.  She stated pt is not wanting to come to the office she doesn't know if its Covid related or not he is just refusing. Message will be routed to PCP for advise on qty. I can send in.

## 2019-08-10 ENCOUNTER — Encounter: Payer: Medicare Other | Admitting: Adult Health

## 2019-09-01 ENCOUNTER — Other Ambulatory Visit: Payer: Self-pay | Admitting: Adult Health

## 2019-09-02 NOTE — Telephone Encounter (Signed)
Denied.  Pt not seen in the office since 2019.  Must be seen in the office for further refills.

## 2020-06-06 ENCOUNTER — Telehealth: Payer: Self-pay | Admitting: Adult Health

## 2020-06-06 NOTE — Telephone Encounter (Signed)
Please advise 

## 2020-06-06 NOTE — Telephone Encounter (Signed)
Heather a NP from [facility] is calling and wanted to let the provider know that she saw pt and he was given a PAD screening and it showed severe decreased circulation in both legs. Caller stated that patient has stated that he has been  non compliant with his care and has no intentions on correcting his health.  Also pt A1C was checked and it was 10.1 but declined any treatment, please advise. CB is [phone]

## 2020-06-06 NOTE — Telephone Encounter (Signed)
Unless he has imminent signs of circulatory compromise such as white foot, severe pain, etc. recommend follow-up with Georgina Snell when he returns to review results and discuss further with patient

## 2020-06-12 NOTE — Telephone Encounter (Signed)
Attempted to call pt not able to leave  message due to voice recording being full, will try again

## 2020-07-13 NOTE — Telephone Encounter (Signed)
Spoke with pt state that the problem with his legs has resolved and that he will call if any need for appointment

## 2020-08-27 ENCOUNTER — Ambulatory Visit: Payer: Medicare Other

## 2020-09-05 ENCOUNTER — Other Ambulatory Visit: Payer: Self-pay

## 2020-09-05 ENCOUNTER — Telehealth (INDEPENDENT_AMBULATORY_CARE_PROVIDER_SITE_OTHER): Payer: Medicare Other | Admitting: Adult Health

## 2020-09-05 VITALS — Ht 74.0 in | Wt 219.0 lb

## 2020-09-05 DIAGNOSIS — I10 Essential (primary) hypertension: Secondary | ICD-10-CM | POA: Diagnosis not present

## 2020-09-05 DIAGNOSIS — E7800 Pure hypercholesterolemia, unspecified: Secondary | ICD-10-CM

## 2020-09-05 DIAGNOSIS — F418 Other specified anxiety disorders: Secondary | ICD-10-CM | POA: Diagnosis not present

## 2020-09-05 DIAGNOSIS — E1169 Type 2 diabetes mellitus with other specified complication: Secondary | ICD-10-CM | POA: Diagnosis not present

## 2020-09-05 NOTE — Progress Notes (Signed)
Virtual Visit via Telephone Note  I connected with Drew Burke on 09/05/20 at  1:00 PM EST by telephone and verified that I am speaking with the correct person using two identifiers.   I discussed the limitations, risks, security and privacy concerns of performing an evaluation and management service by telephone and the availability of in person appointments. I also discussed with the patient that there may be a patient responsible charge related to this service. The patient expressed understanding and agreed to proceed.  Location patient: home Location provider: work or home office Participants present for the call: patient, provider Patient did not have a visit in the prior 7 days to address this/these issue(s).   History of Present Illness: 75 year old male who  has a past medical history of Allergic rhinitis, Allergy, Aneurysm ([place]), Anxiety, COPD (chronic obstructive pulmonary disease) ([place]), Depression, DM type 2 (diabetes mellitus, type 2) ([place]), Hyperlipidemia, Hypertension, and Tobacco abuse.  He was last seen in the office in October 2019.    He has a history of diabetes in which she was on glipizide 2.5 mg extended release twice daily and Metformin 1000 mg twice daily.  He has been out of this medication for roughly a year.  In December 2021 it looks like NP through [place] [facility] came to the house to do an assessment for PAD.  His screening showed severe decreased circulation for both legs.  He did not intend to correct his health.  At this time his A1c was checked and was 10.1.  He also has a history of hypertension for which he was prescribed Hyzaar 100-25 mg daily again this medication has not been filled in roughly 1 year.  Does not check his blood pressure at home  Per lipidemia-was prescribed simvastatin 40 mg daily.  So with a history of the anxiety and depression for which she was prescribed Lexapro 20 mg daily and Valium 5 mg 3 times daily.  Today he reports  that he is no longer taking any medication.  He is not getting out of the house much but does have "a lot of good neighbors checking in on me".  During this phone call a neighbor actually stop by at which time the patient wanted to end this phone call.  He denies depression or suicidal ideation.  He does not want to be on medication any longer and does not want me to send in any medication for him today.  He understands the risks including coma, death, and an organ damage.  When asked if he wanted me to send in 30 days of medications so that he can be covered until he makes into the office he stated "I will think about it but right now I do not want to take any medication".   Observations/Objective: Patient sounds cheerful and well on the phone. I do not appreciate any SOB. Speech and thought processing are grossly intact. Patient reported vitals:  Assessment and Plan: Patient denies depression but that is questionable.  He seems to be alert and oriented and denies suicidal ideation ,despite not taking his medications does not seem to be a harm to himself at this time.  Cannot force medication on the patient, he understands his risks including death.  Advise follow-up whenever he wants  Follow Up Instructions:  I did not refer this patient for an OV in the next 24 hours for this/these issue(s).  I discussed the assessment and treatment plan with the patient. The patient was  provided an opportunity to ask questions and all were answered. The patient agreed with the plan and demonstrated an understanding of the instructions.   The patient was advised to call back or seek an in-person evaluation if the symptoms worsen or if the condition fails to improve as anticipated.  I provided 11 minutes of non-face-to-face time during this encounter.   [name], NP

## 2020-09-14 ENCOUNTER — Telehealth: Payer: Self-pay | Admitting: Adult Health

## 2020-09-14 NOTE — Progress Notes (Signed)
  Chronic Care Management   Outreach Note  09/14/2020 Name: DEYONTE CADDEN MRN: [id number] DOB: [birth year]  Referred by: [name], NP Reason for referral : No chief complaint on file.   An unsuccessful telephone outreach was attempted today. The patient was referred to the pharmacist for assistance with care management and care coordination.   Follow Up Plan:   Carley Perdue [employer] Scheduler

## 2020-10-11 DIAGNOSIS — T1490XA Injury, unspecified, initial encounter: Secondary | ICD-10-CM | POA: Diagnosis not present

## 2020-10-11 DIAGNOSIS — R0689 Other abnormalities of breathing: Secondary | ICD-10-CM | POA: Diagnosis not present

## 2020-10-11 DIAGNOSIS — R001 Bradycardia, unspecified: Secondary | ICD-10-CM | POA: Diagnosis not present

## 2020-10-11 DIAGNOSIS — J449 Chronic obstructive pulmonary disease, unspecified: Secondary | ICD-10-CM | POA: Diagnosis not present

## 2020-10-11 DIAGNOSIS — D696 Thrombocytopenia, unspecified: Secondary | ICD-10-CM | POA: Diagnosis not present

## 2020-10-11 DIAGNOSIS — R410 Disorientation, unspecified: Secondary | ICD-10-CM | POA: Diagnosis not present

## 2020-10-11 DIAGNOSIS — R404 Transient alteration of awareness: Secondary | ICD-10-CM | POA: Diagnosis not present

## 2020-10-11 DIAGNOSIS — E875 Hyperkalemia: Secondary | ICD-10-CM | POA: Diagnosis not present

## 2020-10-11 DIAGNOSIS — I6782 Cerebral ischemia: Secondary | ICD-10-CM | POA: Diagnosis not present

## 2020-10-11 DIAGNOSIS — Z9119 Patient's noncompliance with other medical treatment and regimen: Secondary | ICD-10-CM | POA: Diagnosis not present

## 2020-10-11 DIAGNOSIS — R296 Repeated falls: Secondary | ICD-10-CM | POA: Diagnosis not present

## 2020-10-11 DIAGNOSIS — R000 Tachycardia, unspecified: Secondary | ICD-10-CM | POA: Diagnosis not present

## 2020-10-11 DIAGNOSIS — R059 Cough, unspecified: Secondary | ICD-10-CM | POA: Diagnosis not present

## 2020-10-11 DIAGNOSIS — E119 Type 2 diabetes mellitus without complications: Secondary | ICD-10-CM | POA: Diagnosis not present

## 2020-10-11 DIAGNOSIS — I10 Essential (primary) hypertension: Secondary | ICD-10-CM | POA: Diagnosis not present

## 2020-10-11 DIAGNOSIS — Z9181 History of falling: Secondary | ICD-10-CM | POA: Diagnosis not present

## 2020-10-11 DIAGNOSIS — R2689 Other abnormalities of gait and mobility: Secondary | ICD-10-CM | POA: Diagnosis not present

## 2020-10-11 DIAGNOSIS — Z20822 Contact with and (suspected) exposure to covid-19: Secondary | ICD-10-CM | POA: Diagnosis not present

## 2020-10-11 DIAGNOSIS — E785 Hyperlipidemia, unspecified: Secondary | ICD-10-CM | POA: Diagnosis not present

## 2020-10-11 DIAGNOSIS — L723 Sebaceous cyst: Secondary | ICD-10-CM | POA: Diagnosis not present

## 2020-10-11 DIAGNOSIS — R739 Hyperglycemia, unspecified: Secondary | ICD-10-CM | POA: Diagnosis not present

## 2020-10-11 DIAGNOSIS — Z8673 Personal history of transient ischemic attack (TIA), and cerebral infarction without residual deficits: Secondary | ICD-10-CM | POA: Diagnosis not present

## 2020-10-11 DIAGNOSIS — J984 Other disorders of lung: Secondary | ICD-10-CM | POA: Diagnosis not present

## 2020-10-11 DIAGNOSIS — R531 Weakness: Secondary | ICD-10-CM | POA: Diagnosis not present

## 2020-10-11 DIAGNOSIS — E7849 Other hyperlipidemia: Secondary | ICD-10-CM | POA: Diagnosis not present

## 2020-10-12 DIAGNOSIS — I4891 Unspecified atrial fibrillation: Secondary | ICD-10-CM | POA: Diagnosis not present

## 2020-10-12 DIAGNOSIS — I081 Rheumatic disorders of both mitral and tricuspid valves: Secondary | ICD-10-CM | POA: Diagnosis not present

## 2020-10-12 DIAGNOSIS — I517 Cardiomegaly: Secondary | ICD-10-CM | POA: Diagnosis not present

## 2020-10-12 DIAGNOSIS — R296 Repeated falls: Secondary | ICD-10-CM | POA: Diagnosis not present

## 2020-10-13 DIAGNOSIS — Z20822 Contact with and (suspected) exposure to covid-19: Secondary | ICD-10-CM | POA: Diagnosis not present

## 2020-10-13 DIAGNOSIS — R296 Repeated falls: Secondary | ICD-10-CM | POA: Diagnosis not present

## 2020-10-13 DIAGNOSIS — E7849 Other hyperlipidemia: Secondary | ICD-10-CM | POA: Diagnosis not present

## 2020-10-13 DIAGNOSIS — R531 Weakness: Secondary | ICD-10-CM | POA: Diagnosis not present

## 2020-10-13 DIAGNOSIS — J449 Chronic obstructive pulmonary disease, unspecified: Secondary | ICD-10-CM | POA: Diagnosis not present

## 2020-10-13 DIAGNOSIS — L723 Sebaceous cyst: Secondary | ICD-10-CM | POA: Diagnosis not present

## 2020-10-13 DIAGNOSIS — E785 Hyperlipidemia, unspecified: Secondary | ICD-10-CM | POA: Diagnosis not present

## 2020-10-13 DIAGNOSIS — R2689 Other abnormalities of gait and mobility: Secondary | ICD-10-CM | POA: Diagnosis not present

## 2020-10-13 DIAGNOSIS — R001 Bradycardia, unspecified: Secondary | ICD-10-CM | POA: Diagnosis not present

## 2020-10-13 DIAGNOSIS — I10 Essential (primary) hypertension: Secondary | ICD-10-CM | POA: Diagnosis not present

## 2020-10-13 DIAGNOSIS — Z9181 History of falling: Secondary | ICD-10-CM | POA: Diagnosis not present

## 2020-10-13 DIAGNOSIS — E875 Hyperkalemia: Secondary | ICD-10-CM | POA: Diagnosis not present

## 2020-10-13 DIAGNOSIS — E119 Type 2 diabetes mellitus without complications: Secondary | ICD-10-CM | POA: Diagnosis not present

## 2020-10-13 DIAGNOSIS — Z9119 Patient's noncompliance with other medical treatment and regimen: Secondary | ICD-10-CM | POA: Diagnosis not present

## 2020-10-13 DIAGNOSIS — D696 Thrombocytopenia, unspecified: Secondary | ICD-10-CM | POA: Diagnosis not present

## 2020-10-13 DIAGNOSIS — Z8673 Personal history of transient ischemic attack (TIA), and cerebral infarction without residual deficits: Secondary | ICD-10-CM | POA: Diagnosis not present

## 2020-10-14 DIAGNOSIS — D696 Thrombocytopenia, unspecified: Secondary | ICD-10-CM | POA: Diagnosis not present

## 2020-10-14 DIAGNOSIS — R296 Repeated falls: Secondary | ICD-10-CM | POA: Diagnosis not present

## 2020-10-14 DIAGNOSIS — R001 Bradycardia, unspecified: Secondary | ICD-10-CM | POA: Diagnosis not present

## 2020-10-14 DIAGNOSIS — Z8673 Personal history of transient ischemic attack (TIA), and cerebral infarction without residual deficits: Secondary | ICD-10-CM | POA: Diagnosis not present

## 2020-10-14 DIAGNOSIS — J449 Chronic obstructive pulmonary disease, unspecified: Secondary | ICD-10-CM | POA: Diagnosis not present

## 2020-10-14 DIAGNOSIS — L720 Epidermal cyst: Secondary | ICD-10-CM | POA: Diagnosis not present

## 2020-10-14 DIAGNOSIS — R2689 Other abnormalities of gait and mobility: Secondary | ICD-10-CM | POA: Diagnosis not present

## 2020-10-14 DIAGNOSIS — E875 Hyperkalemia: Secondary | ICD-10-CM | POA: Diagnosis not present

## 2020-10-14 DIAGNOSIS — E785 Hyperlipidemia, unspecified: Secondary | ICD-10-CM | POA: Diagnosis not present

## 2020-10-14 DIAGNOSIS — Z9181 History of falling: Secondary | ICD-10-CM | POA: Diagnosis not present

## 2020-10-14 DIAGNOSIS — I10 Essential (primary) hypertension: Secondary | ICD-10-CM | POA: Diagnosis not present

## 2020-10-14 DIAGNOSIS — Z20822 Contact with and (suspected) exposure to covid-19: Secondary | ICD-10-CM | POA: Diagnosis not present

## 2020-10-14 DIAGNOSIS — E119 Type 2 diabetes mellitus without complications: Secondary | ICD-10-CM | POA: Diagnosis not present

## 2020-10-14 DIAGNOSIS — Z9119 Patient's noncompliance with other medical treatment and regimen: Secondary | ICD-10-CM | POA: Diagnosis not present

## 2020-10-14 DIAGNOSIS — L723 Sebaceous cyst: Secondary | ICD-10-CM | POA: Diagnosis not present

## 2020-10-14 DIAGNOSIS — R531 Weakness: Secondary | ICD-10-CM | POA: Diagnosis not present

## 2020-10-15 DIAGNOSIS — R059 Cough, unspecified: Secondary | ICD-10-CM | POA: Diagnosis not present

## 2020-10-15 DIAGNOSIS — I10 Essential (primary) hypertension: Secondary | ICD-10-CM | POA: Diagnosis not present

## 2020-10-15 DIAGNOSIS — D696 Thrombocytopenia, unspecified: Secondary | ICD-10-CM | POA: Diagnosis not present

## 2020-10-15 DIAGNOSIS — R531 Weakness: Secondary | ICD-10-CM | POA: Diagnosis not present

## 2020-10-15 DIAGNOSIS — Z9181 History of falling: Secondary | ICD-10-CM | POA: Diagnosis not present

## 2020-10-15 DIAGNOSIS — R296 Repeated falls: Secondary | ICD-10-CM | POA: Diagnosis not present

## 2020-10-15 DIAGNOSIS — R001 Bradycardia, unspecified: Secondary | ICD-10-CM | POA: Diagnosis not present

## 2020-10-15 DIAGNOSIS — J984 Other disorders of lung: Secondary | ICD-10-CM | POA: Diagnosis not present

## 2020-10-15 DIAGNOSIS — J449 Chronic obstructive pulmonary disease, unspecified: Secondary | ICD-10-CM | POA: Diagnosis not present

## 2020-10-15 DIAGNOSIS — E785 Hyperlipidemia, unspecified: Secondary | ICD-10-CM | POA: Diagnosis not present

## 2020-10-15 DIAGNOSIS — L723 Sebaceous cyst: Secondary | ICD-10-CM | POA: Diagnosis not present

## 2020-10-15 DIAGNOSIS — E119 Type 2 diabetes mellitus without complications: Secondary | ICD-10-CM | POA: Diagnosis not present

## 2020-10-15 DIAGNOSIS — Z20822 Contact with and (suspected) exposure to covid-19: Secondary | ICD-10-CM | POA: Diagnosis not present

## 2020-10-15 DIAGNOSIS — E875 Hyperkalemia: Secondary | ICD-10-CM | POA: Diagnosis not present

## 2020-10-15 DIAGNOSIS — Z9119 Patient's noncompliance with other medical treatment and regimen: Secondary | ICD-10-CM | POA: Diagnosis not present

## 2020-10-15 DIAGNOSIS — Z8673 Personal history of transient ischemic attack (TIA), and cerebral infarction without residual deficits: Secondary | ICD-10-CM | POA: Diagnosis not present

## 2020-10-15 DIAGNOSIS — R2689 Other abnormalities of gait and mobility: Secondary | ICD-10-CM | POA: Diagnosis not present

## 2020-10-16 DIAGNOSIS — R2689 Other abnormalities of gait and mobility: Secondary | ICD-10-CM | POA: Diagnosis not present

## 2020-10-16 DIAGNOSIS — G9341 Metabolic encephalopathy: Secondary | ICD-10-CM | POA: Diagnosis not present

## 2020-10-16 DIAGNOSIS — F32A Depression, unspecified: Secondary | ICD-10-CM | POA: Diagnosis not present

## 2020-10-16 DIAGNOSIS — Z9181 History of falling: Secondary | ICD-10-CM | POA: Diagnosis not present

## 2020-10-16 DIAGNOSIS — Z7982 Long term (current) use of aspirin: Secondary | ICD-10-CM | POA: Diagnosis not present

## 2020-10-16 DIAGNOSIS — Z9119 Patient's noncompliance with other medical treatment and regimen: Secondary | ICD-10-CM | POA: Diagnosis not present

## 2020-10-16 DIAGNOSIS — L98422 Non-pressure chronic ulcer of back with fat layer exposed: Secondary | ICD-10-CM | POA: Diagnosis not present

## 2020-10-16 DIAGNOSIS — E1140 Type 2 diabetes mellitus with diabetic neuropathy, unspecified: Secondary | ICD-10-CM | POA: Diagnosis not present

## 2020-10-16 DIAGNOSIS — D696 Thrombocytopenia, unspecified: Secondary | ICD-10-CM | POA: Diagnosis not present

## 2020-10-16 DIAGNOSIS — Z8673 Personal history of transient ischemic attack (TIA), and cerebral infarction without residual deficits: Secondary | ICD-10-CM | POA: Diagnosis not present

## 2020-10-16 DIAGNOSIS — R296 Repeated falls: Secondary | ICD-10-CM | POA: Diagnosis not present

## 2020-10-16 DIAGNOSIS — F17210 Nicotine dependence, cigarettes, uncomplicated: Secondary | ICD-10-CM | POA: Diagnosis not present

## 2020-10-16 DIAGNOSIS — E119 Type 2 diabetes mellitus without complications: Secondary | ICD-10-CM | POA: Diagnosis not present

## 2020-10-16 DIAGNOSIS — R531 Weakness: Secondary | ICD-10-CM | POA: Diagnosis not present

## 2020-10-16 DIAGNOSIS — R001 Bradycardia, unspecified: Secondary | ICD-10-CM | POA: Diagnosis not present

## 2020-10-16 DIAGNOSIS — R5381 Other malaise: Secondary | ICD-10-CM | POA: Diagnosis not present

## 2020-10-16 DIAGNOSIS — I10 Essential (primary) hypertension: Secondary | ICD-10-CM | POA: Diagnosis not present

## 2020-10-16 DIAGNOSIS — E785 Hyperlipidemia, unspecified: Secondary | ICD-10-CM | POA: Diagnosis not present

## 2020-10-16 DIAGNOSIS — Z20822 Contact with and (suspected) exposure to covid-19: Secondary | ICD-10-CM | POA: Diagnosis not present

## 2020-10-16 DIAGNOSIS — J449 Chronic obstructive pulmonary disease, unspecified: Secondary | ICD-10-CM | POA: Diagnosis not present

## 2020-10-16 DIAGNOSIS — E875 Hyperkalemia: Secondary | ICD-10-CM | POA: Diagnosis not present

## 2020-10-16 DIAGNOSIS — D649 Anemia, unspecified: Secondary | ICD-10-CM | POA: Diagnosis not present

## 2020-10-16 DIAGNOSIS — L723 Sebaceous cyst: Secondary | ICD-10-CM | POA: Diagnosis not present

## 2020-10-17 DIAGNOSIS — I10 Essential (primary) hypertension: Secondary | ICD-10-CM | POA: Diagnosis not present

## 2020-10-17 DIAGNOSIS — E119 Type 2 diabetes mellitus without complications: Secondary | ICD-10-CM | POA: Diagnosis not present

## 2020-10-17 DIAGNOSIS — D649 Anemia, unspecified: Secondary | ICD-10-CM | POA: Diagnosis not present

## 2020-10-18 DIAGNOSIS — R296 Repeated falls: Secondary | ICD-10-CM | POA: Diagnosis not present

## 2020-10-18 DIAGNOSIS — R001 Bradycardia, unspecified: Secondary | ICD-10-CM | POA: Diagnosis not present

## 2020-10-18 DIAGNOSIS — I10 Essential (primary) hypertension: Secondary | ICD-10-CM | POA: Diagnosis not present

## 2020-10-18 DIAGNOSIS — E1140 Type 2 diabetes mellitus with diabetic neuropathy, unspecified: Secondary | ICD-10-CM | POA: Diagnosis not present

## 2020-10-18 DIAGNOSIS — R5381 Other malaise: Secondary | ICD-10-CM | POA: Diagnosis not present

## 2020-10-18 DIAGNOSIS — J449 Chronic obstructive pulmonary disease, unspecified: Secondary | ICD-10-CM | POA: Diagnosis not present

## 2020-10-18 DIAGNOSIS — L723 Sebaceous cyst: Secondary | ICD-10-CM | POA: Diagnosis not present

## 2020-10-22 DIAGNOSIS — G9341 Metabolic encephalopathy: Secondary | ICD-10-CM | POA: Diagnosis not present

## 2020-10-22 DIAGNOSIS — L98422 Non-pressure chronic ulcer of back with fat layer exposed: Secondary | ICD-10-CM | POA: Diagnosis not present

## 2020-10-22 DIAGNOSIS — R5381 Other malaise: Secondary | ICD-10-CM | POA: Diagnosis not present

## 2020-10-23 DIAGNOSIS — R5381 Other malaise: Secondary | ICD-10-CM | POA: Diagnosis not present

## 2020-10-23 DIAGNOSIS — L723 Sebaceous cyst: Secondary | ICD-10-CM | POA: Diagnosis not present

## 2020-10-23 DIAGNOSIS — E1140 Type 2 diabetes mellitus with diabetic neuropathy, unspecified: Secondary | ICD-10-CM | POA: Diagnosis not present

## 2020-10-23 DIAGNOSIS — J449 Chronic obstructive pulmonary disease, unspecified: Secondary | ICD-10-CM | POA: Diagnosis not present

## 2020-10-23 DIAGNOSIS — R001 Bradycardia, unspecified: Secondary | ICD-10-CM | POA: Diagnosis not present

## 2020-10-23 DIAGNOSIS — R296 Repeated falls: Secondary | ICD-10-CM | POA: Diagnosis not present

## 2020-10-23 DIAGNOSIS — I10 Essential (primary) hypertension: Secondary | ICD-10-CM | POA: Diagnosis not present

## 2020-10-28 DIAGNOSIS — Z9119 Patient's noncompliance with other medical treatment and regimen: Secondary | ICD-10-CM | POA: Diagnosis not present

## 2020-10-28 DIAGNOSIS — E7849 Other hyperlipidemia: Secondary | ICD-10-CM | POA: Diagnosis not present

## 2020-10-28 DIAGNOSIS — F17210 Nicotine dependence, cigarettes, uncomplicated: Secondary | ICD-10-CM | POA: Diagnosis not present

## 2020-10-28 DIAGNOSIS — Z8673 Personal history of transient ischemic attack (TIA), and cerebral infarction without residual deficits: Secondary | ICD-10-CM | POA: Diagnosis not present

## 2020-10-28 DIAGNOSIS — I10 Essential (primary) hypertension: Secondary | ICD-10-CM | POA: Diagnosis not present

## 2020-10-28 DIAGNOSIS — R001 Bradycardia, unspecified: Secondary | ICD-10-CM | POA: Diagnosis not present

## 2020-10-28 DIAGNOSIS — I679 Cerebrovascular disease, unspecified: Secondary | ICD-10-CM | POA: Diagnosis not present

## 2020-10-28 DIAGNOSIS — E1140 Type 2 diabetes mellitus with diabetic neuropathy, unspecified: Secondary | ICD-10-CM | POA: Diagnosis not present

## 2020-10-28 DIAGNOSIS — Z7984 Long term (current) use of oral hypoglycemic drugs: Secondary | ICD-10-CM | POA: Diagnosis not present

## 2020-10-28 DIAGNOSIS — Z9181 History of falling: Secondary | ICD-10-CM | POA: Diagnosis not present

## 2020-10-28 DIAGNOSIS — J449 Chronic obstructive pulmonary disease, unspecified: Secondary | ICD-10-CM | POA: Diagnosis not present

## 2020-10-28 DIAGNOSIS — Z794 Long term (current) use of insulin: Secondary | ICD-10-CM | POA: Diagnosis not present

## 2020-10-28 DIAGNOSIS — L723 Sebaceous cyst: Secondary | ICD-10-CM | POA: Diagnosis not present

## 2020-10-30 DIAGNOSIS — E7849 Other hyperlipidemia: Secondary | ICD-10-CM | POA: Diagnosis not present

## 2020-10-30 DIAGNOSIS — F17210 Nicotine dependence, cigarettes, uncomplicated: Secondary | ICD-10-CM | POA: Diagnosis not present

## 2020-10-30 DIAGNOSIS — Z9181 History of falling: Secondary | ICD-10-CM | POA: Diagnosis not present

## 2020-10-30 DIAGNOSIS — Z7984 Long term (current) use of oral hypoglycemic drugs: Secondary | ICD-10-CM | POA: Diagnosis not present

## 2020-10-30 DIAGNOSIS — Z9119 Patient's noncompliance with other medical treatment and regimen: Secondary | ICD-10-CM | POA: Diagnosis not present

## 2020-10-30 DIAGNOSIS — J449 Chronic obstructive pulmonary disease, unspecified: Secondary | ICD-10-CM | POA: Diagnosis not present

## 2020-10-30 DIAGNOSIS — Z794 Long term (current) use of insulin: Secondary | ICD-10-CM | POA: Diagnosis not present

## 2020-10-30 DIAGNOSIS — Z8673 Personal history of transient ischemic attack (TIA), and cerebral infarction without residual deficits: Secondary | ICD-10-CM | POA: Diagnosis not present

## 2020-10-30 DIAGNOSIS — E1140 Type 2 diabetes mellitus with diabetic neuropathy, unspecified: Secondary | ICD-10-CM | POA: Diagnosis not present

## 2020-10-30 DIAGNOSIS — I10 Essential (primary) hypertension: Secondary | ICD-10-CM | POA: Diagnosis not present

## 2020-10-30 DIAGNOSIS — L723 Sebaceous cyst: Secondary | ICD-10-CM | POA: Diagnosis not present

## 2020-10-30 DIAGNOSIS — I679 Cerebrovascular disease, unspecified: Secondary | ICD-10-CM | POA: Diagnosis not present

## 2020-10-30 DIAGNOSIS — R001 Bradycardia, unspecified: Secondary | ICD-10-CM | POA: Diagnosis not present

## 2020-10-31 ENCOUNTER — Telehealth: Payer: Self-pay | Admitting: Adult Health

## 2020-10-31 NOTE — Progress Notes (Signed)
  Chronic Care Management   Outreach Note  10/31/2020 Name: Drew Burke MRN: [id number] DOB: [birth year]  Referred by: [name], NP Reason for referral : No chief complaint on file.   A second unsuccessful telephone outreach was attempted today. The patient was referred to pharmacist for assistance with care management and care coordination.  Follow Up Plan:   Carley Perdue [employer] Scheduler

## 2020-11-01 ENCOUNTER — Other Ambulatory Visit: Payer: Self-pay

## 2020-11-01 ENCOUNTER — Ambulatory Visit (INDEPENDENT_AMBULATORY_CARE_PROVIDER_SITE_OTHER): Payer: Medicare Other | Admitting: Adult Health

## 2020-11-01 ENCOUNTER — Encounter: Payer: Self-pay | Admitting: Adult Health

## 2020-11-01 VITALS — BP 120/70 | HR 88 | Temp 98.4°F

## 2020-11-01 DIAGNOSIS — R296 Repeated falls: Secondary | ICD-10-CM

## 2020-11-01 DIAGNOSIS — I10 Essential (primary) hypertension: Secondary | ICD-10-CM | POA: Diagnosis not present

## 2020-11-01 DIAGNOSIS — L603 Nail dystrophy: Secondary | ICD-10-CM | POA: Diagnosis not present

## 2020-11-01 DIAGNOSIS — I739 Peripheral vascular disease, unspecified: Secondary | ICD-10-CM

## 2020-11-01 DIAGNOSIS — E7800 Pure hypercholesterolemia, unspecified: Secondary | ICD-10-CM | POA: Diagnosis not present

## 2020-11-01 DIAGNOSIS — R001 Bradycardia, unspecified: Secondary | ICD-10-CM | POA: Diagnosis not present

## 2020-11-01 DIAGNOSIS — E1169 Type 2 diabetes mellitus with other specified complication: Secondary | ICD-10-CM | POA: Diagnosis not present

## 2020-11-01 NOTE — Progress Notes (Signed)
Subjective:    Patient ID: Drew Burke, male    DOB: 1945/08/12, 75 y.o.   MRN: [id number]  HPI 75 year old male who  has a past medical history of Allergic rhinitis, Allergy, Aneurysm ([place]), Anxiety, COPD (chronic obstructive pulmonary disease) ([place]), Depression, DM type 2 (diabetes mellitus, type 2) ([place]), Hyperlipidemia, Hypertension, and Tobacco abuse.   He presents with his niece for hospital follow up.   Admit Date: 10/11/2020 Discharge Date 10/16/2020 SNF Discharge: ? Date not known   He was brought to the emergency room by EMS after he was found on the floor by caregiver.  He was slow to respond and lethargic.  EMS noted the patient's temperature was 103 degrees.  His work-up in the emergency room was relatively unremarkable, CT of the head did not show any acute intracranial abnormality and chest x-ray without any focal airspace disease.  Hospital Course  1. Recurrent Falls/possible Syncope/Fever -was not clear if patient had a true fever or if it was due to increased temperature in the house.  His fever resolved after admission did not recur.  Blood cultures were negative.  2. Bradycardia -he was maintained on telemetry due to his presentation of possible syncope.  He remained in sinus bradycardia with a heart rate in the 50s with PACs.  There were no obvious pauses or heart block seen.  It was recommended that the patient follow-up with cardiology for cardiac event monitor as outpatient  3. Type 2 diabetes -hemoglobin A1c was 8.9, this was surprising since he has not taken any diabetes medication since March 2021 at the least.  He was discharged on low-dose Lantus and metformin  4.  Central hypertension/hyperlipidemia-has a prior history of CVA.  He was started on aspirin and a statin.  Blood pressure remained relatively stable throughout admission and he had been started on low-dose lisinopril.  He was last seen by this Probation officer, in the office in October 2019.  We did have  a telephone visit in March of this year at which time he refused to take any medications.  He continues to live at home by himself and he wishes to stay there.  He does have a caregiver coming in to the home each morning for about 3 hours, but family is going to increase to likely 12 hours a day.  A neighbor also watches it on him and his niece stops by frequently.  He has been taking all of his medications ( except for insulin) that were prescribed prior to discharge, but only taking them in the morning.   His niece is concerned for some additional items that she would like to address today.  She reports that he needs to be seen by a podiatrist due to long thick toenails and she has also noticed that both feet are cold at times and discolored purple.  PT/OT has started coming out to the house.  He has grab bars, shower chair and a walker at home    Review of Systems  Constitutional: Positive for fatigue.  HENT: Negative.   Respiratory: Negative.   Cardiovascular: Negative.   Gastrointestinal: Negative.   Genitourinary: Negative.   Musculoskeletal: Negative.   Skin: Positive for color change.  Neurological: Negative.   Hematological: Negative.   Psychiatric/Behavioral: Negative.   All other systems reviewed and are negative.  Past Medical History:  Diagnosis Date  . Allergic rhinitis   . Allergy    pt denies  . Aneurysm ([place])   . Anxiety   .  COPD (chronic obstructive pulmonary disease) ([place])   . Depression   . DM type 2 (diabetes mellitus, type 2) ([place])   . Hyperlipidemia   . Hypertension   . Tobacco abuse     Social History   Socioeconomic History  . Marital status: Divorced    Spouse name: Not on file  . Number of children: Not on file  . Years of education: Not on file  . Highest education level: Not on file  Occupational History  . Occupation: Scientist, clinical (histocompatibility and immunogenetics): [employer]  Tobacco Use  . Smoking status: Current Every Day Smoker    Packs/day: 1.00     Years: 54.00    Pack years: 54.00    Types: Cigarettes    Start date: 07/07/1960  . Smokeless tobacco: Never Used  . Tobacco comment: used to smoke 3 ppd - currently smoking 1 ppd - started smoking at age 62.  Substance and Sexual Activity  . Alcohol use: Yes    Alcohol/week: 28.0 standard drinks    Types: 28 Shots of liquor per week    Comment: Patient thinks he self medicates with alcohol for back pain. Does not drink if taking hydrocodone- 2 scothches a day   . Drug use: No  . Sexual activity: Not on file  Other Topics Concern  . Not on file  Social History Narrative   Retired - Diplomatic Services operational officer by himself   Has a dog   Social Determinants of Radio broadcast assistant Strain: Not on file  Food Insecurity: Not on file  Transportation Needs: Not on file  Physical Activity: Not on file  Stress: Not on file  Social Connections: Not on file  Intimate Partner Violence: Not on file    Past Surgical History:  Procedure Laterality Date  . COLONOSCOPY    . corneal transplant surgery left eye    . FOOT SURGERY     hammer toe and bone spur  . LUMBAR DISC SURGERY    . LUMBAR LAMINECTOMY      Family History  Problem Relation Age of Onset  . Depression Other   . Hypertension Other   . Heart disease Father   . Alcohol abuse Father   . Cancer Mother        intestines-duodenal cancer vs stomach per pt.  . Stomach cancer Mother   . Colon cancer Neg Hx   . Colon polyps Neg Hx   . Esophageal cancer Neg Hx   . Rectal cancer Neg Hx     No Known Allergies  Current Outpatient Medications on File Prior to Visit  Medication Sig Dispense Refill  . albuterol (VENTOLIN HFA) 108 (90 Base) MCG/ACT inhaler TAKE 2 PUFFS BY MOUTH EVERY 4 HOURS AS NEEDED FOR WHEEZE 6.7 g 0  . aspirin EC 81 MG tablet Take 81 mg by mouth daily.    . Cyanocobalamin (VITAMIN B 12 PO) Take 2,000 mcg by mouth daily.    . diazepam (VALIUM) 5 MG tablet TAKE 1 TABLET BY MOUTH THREE TIMES A DAY 90 tablet 0  .  escitalopram (LEXAPRO) 20 MG tablet Take 1 tablet (20 mg total) by mouth daily. 30 tablet 0  . fluticasone furoate-vilanterol (BREO ELLIPTA) 100-25 MCG/INH AEPB TAKE 1 PUFF BY MOUTH EVERY DAY 28 each 0  . ONETOUCH DELICA LANCETS FINE MISC Test twice daily. 100 each 5  . ONETOUCH VERIO test strip USE TO TEST BLOOD SUGAR TWICE DAILY 200 strip 3  . potassium  chloride (KLOR-CON) 10 MEQ tablet TAKE 1 TABLET BY MOUTH EVERY DAY 90 tablet 0  . Probiotic Product (ALIGN PO) Take 1 capsule by mouth daily.    Marland Kitchen triamcinolone cream (KENALOG) 0.5 % APPLY TOPICALLY TWICE A DAY 30 g 1  . glipiZIDE (GLUCOTROL XL) 2.5 MG 24 hr tablet Take 1 tablet (2.5 mg total) by mouth 2 (two) times daily. **DUE FOR PHYSICAL** 60 tablet 0  . losartan-hydrochlorothiazide (HYZAAR) 100-25 MG tablet Take 1 tablet by mouth daily. **DUE FOR PHYSICAL** 30 tablet 0  . metFORMIN (GLUCOPHAGE) 1000 MG tablet Take 1 tablet (1,000 mg total) by mouth 2 (two) times daily. **DUE FOR PHYSICAL** 60 tablet 0  . simvastatin (ZOCOR) 40 MG tablet Take 1 tablet (40 mg total) by mouth at bedtime. **DUE FOR PHYSICAL** 90 tablet 0   No current facility-administered medications on file prior to visit.    BP 120/70 (BP Location: Left Arm, Patient Position: Sitting, Cuff Size: Normal)   Pulse 88   Temp 98.4 F (36.9 C) (Oral)   SpO2 96%       Objective:   Physical Exam Vitals and nursing note reviewed.  Constitutional:      Appearance: Normal appearance.  Cardiovascular:     Rate and Rhythm: Normal rate and regular rhythm.     Pulses:          Popliteal pulses are 1+ on the right side and 1+ on the left side.       Dorsalis pedis pulses are 1+ on the right side and 1+ on the left side.       Posterior tibial pulses are 1+ on the right side and 1+ on the left side.     Heart sounds: Normal heart sounds.     Comments: Purplish discoloration to both feet. Toes and cool to touch   Pulmonary:     Effort: Pulmonary effort is normal.     Breath  sounds: Normal breath sounds.  Musculoskeletal:        General: Normal range of motion.     Right lower leg: No edema.     Left lower leg: No edema.  Feet:     Right foot:     Toenail Condition: Right toenails are abnormally thick and long. Fungal disease present.    Left foot:     Toenail Condition: Left toenails are abnormally thick and long. Fungal disease present. Skin:    General: Skin is warm and dry.     Capillary Refill: Capillary refill takes less than 2 seconds.  Neurological:     General: No focal deficit present.     Mental Status: He is alert and oriented to person, place, and time.     Gait: Gait abnormal (in wheelchair for exam ).  Psychiatric:        Mood and Affect: Mood normal.        Behavior: Behavior normal.        Thought Content: Thought content normal.        Judgment: Judgment normal.       Assessment & Plan:  1. Controlled type 2 diabetes mellitus with other specified complication, without long-term current use of insulin ([place]) -Reviewed hospital notes, discharge instructions, labs, and imaging that was done at [facility].  All questions answered to the best of my ability - Continue with Metformin. Due to his health status, I would rather him not be on insulin right now. Will have him follow up in three  months  - CBC with Differential/Platelet; Future - Comprehensive metabolic panel; Future - Comprehensive metabolic panel - CBC with Differential/Platelet - metFORMIN (GLUCOPHAGE) 500 MG tablet; Take 500 mg by mouth in the morning and at bedtime. - Ambulatory referral to Podiatry  2. Essential hypertension - Well controlled. No change in medication at this time  - CBC with Differential/Platelet; Future - Comprehensive metabolic panel; Future - Comprehensive metabolic panel - CBC with Differential/Platelet - lisinopril (ZESTRIL) 5 MG tablet; Take 5 mg by mouth daily.  3. Hyperlipidemia, group A  - atorvastatin (LIPITOR) 40 MG tablet; Take  40 mg by mouth daily.  4. Nail dystrophy  - Ambulatory referral to Podiatry  5. PVD (peripheral vascular disease) ([facility])  - CBC with Differential/Platelet; Future - Comprehensive metabolic panel; Future - Ambulatory referral to Vascular Surgery  6. Bradycardia  - Ambulatory referral to Cardiology  7. Frequent falls - Needs someone at home more hours per day with him - He is adamant about not leaving his home. - Continue with PT/OT   [name], NP

## 2020-11-01 NOTE — Patient Instructions (Signed)
I am glad to see you again   I am going to refer you to Cardiology, Podiatry, and Vascular Surgery   I will follow up with you regarding your blood work   Please come back in three months

## 2020-11-02 DIAGNOSIS — E1140 Type 2 diabetes mellitus with diabetic neuropathy, unspecified: Secondary | ICD-10-CM | POA: Diagnosis not present

## 2020-11-02 DIAGNOSIS — Z7984 Long term (current) use of oral hypoglycemic drugs: Secondary | ICD-10-CM | POA: Diagnosis not present

## 2020-11-02 DIAGNOSIS — J449 Chronic obstructive pulmonary disease, unspecified: Secondary | ICD-10-CM | POA: Diagnosis not present

## 2020-11-02 DIAGNOSIS — Z9119 Patient's noncompliance with other medical treatment and regimen: Secondary | ICD-10-CM | POA: Diagnosis not present

## 2020-11-02 DIAGNOSIS — E7849 Other hyperlipidemia: Secondary | ICD-10-CM | POA: Diagnosis not present

## 2020-11-02 DIAGNOSIS — R001 Bradycardia, unspecified: Secondary | ICD-10-CM | POA: Diagnosis not present

## 2020-11-02 DIAGNOSIS — Z9181 History of falling: Secondary | ICD-10-CM | POA: Diagnosis not present

## 2020-11-02 DIAGNOSIS — Z794 Long term (current) use of insulin: Secondary | ICD-10-CM | POA: Diagnosis not present

## 2020-11-02 DIAGNOSIS — Z8673 Personal history of transient ischemic attack (TIA), and cerebral infarction without residual deficits: Secondary | ICD-10-CM | POA: Diagnosis not present

## 2020-11-02 DIAGNOSIS — F17210 Nicotine dependence, cigarettes, uncomplicated: Secondary | ICD-10-CM | POA: Diagnosis not present

## 2020-11-02 DIAGNOSIS — I679 Cerebrovascular disease, unspecified: Secondary | ICD-10-CM | POA: Diagnosis not present

## 2020-11-02 DIAGNOSIS — L723 Sebaceous cyst: Secondary | ICD-10-CM | POA: Diagnosis not present

## 2020-11-02 DIAGNOSIS — I10 Essential (primary) hypertension: Secondary | ICD-10-CM | POA: Diagnosis not present

## 2020-11-02 LAB — CBC WITH DIFFERENTIAL/PLATELET
Basophils Absolute: 0 10*3/uL (ref 0.0–0.1)
Basophils Relative: 0.5 % (ref 0.0–3.0)
Eosinophils Absolute: 0.2 10*3/uL (ref 0.0–0.7)
Eosinophils Relative: 2 % (ref 0.0–5.0)
HCT: 44.3 % (ref 39.0–52.0)
Hemoglobin: 15.1 g/dL (ref 13.0–17.0)
Lymphocytes Relative: 41.9 % (ref 12.0–46.0)
Lymphs Abs: 3.4 10*3/uL (ref 0.7–4.0)
MCHC: 34.1 g/dL (ref 30.0–36.0)
MCV: 89.6 fl (ref 78.0–100.0)
Monocytes Absolute: 0.5 10*3/uL (ref 0.1–1.0)
Monocytes Relative: 5.6 % (ref 3.0–12.0)
Neutro Abs: 4.1 10*3/uL (ref 1.4–7.7)
Neutrophils Relative %: 50 % (ref 43.0–77.0)
Platelets: 147 10*3/uL — ABNORMAL LOW (ref 150.0–400.0)
RBC: 4.94 Mil/uL (ref 4.22–5.81)
RDW: 13.6 % (ref 11.5–15.5)
WBC: 8.1 10*3/uL (ref 4.0–10.5)

## 2020-11-02 LAB — COMPREHENSIVE METABOLIC PANEL
ALT: 14 U/L (ref 0–53)
AST: 14 U/L (ref 0–37)
Albumin: 3.8 g/dL (ref 3.5–5.2)
Alkaline Phosphatase: 60 U/L (ref 39–117)
BUN: 11 mg/dL (ref 6–23)
CO2: 29 mEq/L (ref 19–32)
Calcium: 9 mg/dL (ref 8.4–10.5)
Chloride: 100 mEq/L (ref 96–112)
Creatinine, Ser: 0.88 mg/dL (ref 0.40–1.50)
GFR: 84.72 mL/min (ref >60.00)
Glucose, Bld: 145 mg/dL — ABNORMAL HIGH (ref 70–99)
Potassium: 3.3 mEq/L — ABNORMAL LOW (ref 3.5–5.1)
Sodium: 139 mEq/L (ref 135–145)
Total Bilirubin: 0.9 mg/dL (ref 0.2–1.2)
Total Protein: 5.6 g/dL — ABNORMAL LOW (ref 6.0–8.3)

## 2020-11-06 DIAGNOSIS — R001 Bradycardia, unspecified: Secondary | ICD-10-CM | POA: Diagnosis not present

## 2020-11-06 DIAGNOSIS — Z9181 History of falling: Secondary | ICD-10-CM | POA: Diagnosis not present

## 2020-11-06 DIAGNOSIS — Z9119 Patient's noncompliance with other medical treatment and regimen: Secondary | ICD-10-CM | POA: Diagnosis not present

## 2020-11-06 DIAGNOSIS — F17210 Nicotine dependence, cigarettes, uncomplicated: Secondary | ICD-10-CM | POA: Diagnosis not present

## 2020-11-06 DIAGNOSIS — I10 Essential (primary) hypertension: Secondary | ICD-10-CM | POA: Diagnosis not present

## 2020-11-06 DIAGNOSIS — J449 Chronic obstructive pulmonary disease, unspecified: Secondary | ICD-10-CM | POA: Diagnosis not present

## 2020-11-06 DIAGNOSIS — L723 Sebaceous cyst: Secondary | ICD-10-CM | POA: Diagnosis not present

## 2020-11-06 DIAGNOSIS — E7849 Other hyperlipidemia: Secondary | ICD-10-CM | POA: Diagnosis not present

## 2020-11-06 DIAGNOSIS — I679 Cerebrovascular disease, unspecified: Secondary | ICD-10-CM | POA: Diagnosis not present

## 2020-11-06 DIAGNOSIS — Z7984 Long term (current) use of oral hypoglycemic drugs: Secondary | ICD-10-CM | POA: Diagnosis not present

## 2020-11-06 DIAGNOSIS — Z8673 Personal history of transient ischemic attack (TIA), and cerebral infarction without residual deficits: Secondary | ICD-10-CM | POA: Diagnosis not present

## 2020-11-06 DIAGNOSIS — Z794 Long term (current) use of insulin: Secondary | ICD-10-CM | POA: Diagnosis not present

## 2020-11-06 DIAGNOSIS — E1140 Type 2 diabetes mellitus with diabetic neuropathy, unspecified: Secondary | ICD-10-CM | POA: Diagnosis not present

## 2020-11-07 DIAGNOSIS — Z7984 Long term (current) use of oral hypoglycemic drugs: Secondary | ICD-10-CM | POA: Diagnosis not present

## 2020-11-07 DIAGNOSIS — E7849 Other hyperlipidemia: Secondary | ICD-10-CM | POA: Diagnosis not present

## 2020-11-07 DIAGNOSIS — E1140 Type 2 diabetes mellitus with diabetic neuropathy, unspecified: Secondary | ICD-10-CM | POA: Diagnosis not present

## 2020-11-07 DIAGNOSIS — Z9181 History of falling: Secondary | ICD-10-CM | POA: Diagnosis not present

## 2020-11-07 DIAGNOSIS — I10 Essential (primary) hypertension: Secondary | ICD-10-CM | POA: Diagnosis not present

## 2020-11-07 DIAGNOSIS — Z8673 Personal history of transient ischemic attack (TIA), and cerebral infarction without residual deficits: Secondary | ICD-10-CM | POA: Diagnosis not present

## 2020-11-07 DIAGNOSIS — Z794 Long term (current) use of insulin: Secondary | ICD-10-CM | POA: Diagnosis not present

## 2020-11-07 DIAGNOSIS — L723 Sebaceous cyst: Secondary | ICD-10-CM | POA: Diagnosis not present

## 2020-11-07 DIAGNOSIS — F17210 Nicotine dependence, cigarettes, uncomplicated: Secondary | ICD-10-CM | POA: Diagnosis not present

## 2020-11-07 DIAGNOSIS — R001 Bradycardia, unspecified: Secondary | ICD-10-CM | POA: Diagnosis not present

## 2020-11-07 DIAGNOSIS — I679 Cerebrovascular disease, unspecified: Secondary | ICD-10-CM | POA: Diagnosis not present

## 2020-11-07 DIAGNOSIS — Z9119 Patient's noncompliance with other medical treatment and regimen: Secondary | ICD-10-CM | POA: Diagnosis not present

## 2020-11-07 DIAGNOSIS — J449 Chronic obstructive pulmonary disease, unspecified: Secondary | ICD-10-CM | POA: Diagnosis not present

## 2020-11-09 DIAGNOSIS — I10 Essential (primary) hypertension: Secondary | ICD-10-CM | POA: Diagnosis not present

## 2020-11-09 DIAGNOSIS — F17210 Nicotine dependence, cigarettes, uncomplicated: Secondary | ICD-10-CM | POA: Diagnosis not present

## 2020-11-09 DIAGNOSIS — Z9119 Patient's noncompliance with other medical treatment and regimen: Secondary | ICD-10-CM | POA: Diagnosis not present

## 2020-11-09 DIAGNOSIS — Z7984 Long term (current) use of oral hypoglycemic drugs: Secondary | ICD-10-CM | POA: Diagnosis not present

## 2020-11-09 DIAGNOSIS — J449 Chronic obstructive pulmonary disease, unspecified: Secondary | ICD-10-CM | POA: Diagnosis not present

## 2020-11-09 DIAGNOSIS — Z8673 Personal history of transient ischemic attack (TIA), and cerebral infarction without residual deficits: Secondary | ICD-10-CM | POA: Diagnosis not present

## 2020-11-09 DIAGNOSIS — Z794 Long term (current) use of insulin: Secondary | ICD-10-CM | POA: Diagnosis not present

## 2020-11-09 DIAGNOSIS — E1140 Type 2 diabetes mellitus with diabetic neuropathy, unspecified: Secondary | ICD-10-CM | POA: Diagnosis not present

## 2020-11-09 DIAGNOSIS — E7849 Other hyperlipidemia: Secondary | ICD-10-CM | POA: Diagnosis not present

## 2020-11-09 DIAGNOSIS — I679 Cerebrovascular disease, unspecified: Secondary | ICD-10-CM | POA: Diagnosis not present

## 2020-11-09 DIAGNOSIS — R001 Bradycardia, unspecified: Secondary | ICD-10-CM | POA: Diagnosis not present

## 2020-11-09 DIAGNOSIS — L723 Sebaceous cyst: Secondary | ICD-10-CM | POA: Diagnosis not present

## 2020-11-09 DIAGNOSIS — Z9181 History of falling: Secondary | ICD-10-CM | POA: Diagnosis not present

## 2020-11-13 DIAGNOSIS — E1140 Type 2 diabetes mellitus with diabetic neuropathy, unspecified: Secondary | ICD-10-CM | POA: Diagnosis not present

## 2020-11-13 DIAGNOSIS — I10 Essential (primary) hypertension: Secondary | ICD-10-CM | POA: Diagnosis not present

## 2020-11-13 DIAGNOSIS — R001 Bradycardia, unspecified: Secondary | ICD-10-CM | POA: Diagnosis not present

## 2020-11-13 DIAGNOSIS — Z7984 Long term (current) use of oral hypoglycemic drugs: Secondary | ICD-10-CM | POA: Diagnosis not present

## 2020-11-13 DIAGNOSIS — Z8673 Personal history of transient ischemic attack (TIA), and cerebral infarction without residual deficits: Secondary | ICD-10-CM | POA: Diagnosis not present

## 2020-11-13 DIAGNOSIS — L723 Sebaceous cyst: Secondary | ICD-10-CM | POA: Diagnosis not present

## 2020-11-13 DIAGNOSIS — Z794 Long term (current) use of insulin: Secondary | ICD-10-CM | POA: Diagnosis not present

## 2020-11-13 DIAGNOSIS — I679 Cerebrovascular disease, unspecified: Secondary | ICD-10-CM | POA: Diagnosis not present

## 2020-11-13 DIAGNOSIS — Z9181 History of falling: Secondary | ICD-10-CM | POA: Diagnosis not present

## 2020-11-13 DIAGNOSIS — J449 Chronic obstructive pulmonary disease, unspecified: Secondary | ICD-10-CM | POA: Diagnosis not present

## 2020-11-13 DIAGNOSIS — F17210 Nicotine dependence, cigarettes, uncomplicated: Secondary | ICD-10-CM | POA: Diagnosis not present

## 2020-11-13 DIAGNOSIS — Z9119 Patient's noncompliance with other medical treatment and regimen: Secondary | ICD-10-CM | POA: Diagnosis not present

## 2020-11-13 DIAGNOSIS — E7849 Other hyperlipidemia: Secondary | ICD-10-CM | POA: Diagnosis not present

## 2020-11-14 DIAGNOSIS — E7849 Other hyperlipidemia: Secondary | ICD-10-CM | POA: Diagnosis not present

## 2020-11-14 DIAGNOSIS — Z794 Long term (current) use of insulin: Secondary | ICD-10-CM | POA: Diagnosis not present

## 2020-11-14 DIAGNOSIS — L723 Sebaceous cyst: Secondary | ICD-10-CM | POA: Diagnosis not present

## 2020-11-14 DIAGNOSIS — F17210 Nicotine dependence, cigarettes, uncomplicated: Secondary | ICD-10-CM | POA: Diagnosis not present

## 2020-11-14 DIAGNOSIS — Z9119 Patient's noncompliance with other medical treatment and regimen: Secondary | ICD-10-CM | POA: Diagnosis not present

## 2020-11-14 DIAGNOSIS — Z8673 Personal history of transient ischemic attack (TIA), and cerebral infarction without residual deficits: Secondary | ICD-10-CM | POA: Diagnosis not present

## 2020-11-14 DIAGNOSIS — Z9181 History of falling: Secondary | ICD-10-CM | POA: Diagnosis not present

## 2020-11-14 DIAGNOSIS — J449 Chronic obstructive pulmonary disease, unspecified: Secondary | ICD-10-CM | POA: Diagnosis not present

## 2020-11-14 DIAGNOSIS — I679 Cerebrovascular disease, unspecified: Secondary | ICD-10-CM | POA: Diagnosis not present

## 2020-11-14 DIAGNOSIS — E1140 Type 2 diabetes mellitus with diabetic neuropathy, unspecified: Secondary | ICD-10-CM | POA: Diagnosis not present

## 2020-11-14 DIAGNOSIS — I10 Essential (primary) hypertension: Secondary | ICD-10-CM | POA: Diagnosis not present

## 2020-11-14 DIAGNOSIS — Z7984 Long term (current) use of oral hypoglycemic drugs: Secondary | ICD-10-CM | POA: Diagnosis not present

## 2020-11-14 DIAGNOSIS — R001 Bradycardia, unspecified: Secondary | ICD-10-CM | POA: Diagnosis not present

## 2020-11-16 DIAGNOSIS — F17210 Nicotine dependence, cigarettes, uncomplicated: Secondary | ICD-10-CM | POA: Diagnosis not present

## 2020-11-16 DIAGNOSIS — Z7984 Long term (current) use of oral hypoglycemic drugs: Secondary | ICD-10-CM | POA: Diagnosis not present

## 2020-11-16 DIAGNOSIS — L723 Sebaceous cyst: Secondary | ICD-10-CM | POA: Diagnosis not present

## 2020-11-16 DIAGNOSIS — I10 Essential (primary) hypertension: Secondary | ICD-10-CM | POA: Diagnosis not present

## 2020-11-16 DIAGNOSIS — Z794 Long term (current) use of insulin: Secondary | ICD-10-CM | POA: Diagnosis not present

## 2020-11-16 DIAGNOSIS — Z9181 History of falling: Secondary | ICD-10-CM | POA: Diagnosis not present

## 2020-11-16 DIAGNOSIS — J449 Chronic obstructive pulmonary disease, unspecified: Secondary | ICD-10-CM | POA: Diagnosis not present

## 2020-11-16 DIAGNOSIS — Z9119 Patient's noncompliance with other medical treatment and regimen: Secondary | ICD-10-CM | POA: Diagnosis not present

## 2020-11-16 DIAGNOSIS — I679 Cerebrovascular disease, unspecified: Secondary | ICD-10-CM | POA: Diagnosis not present

## 2020-11-16 DIAGNOSIS — E7849 Other hyperlipidemia: Secondary | ICD-10-CM | POA: Diagnosis not present

## 2020-11-16 DIAGNOSIS — R001 Bradycardia, unspecified: Secondary | ICD-10-CM | POA: Diagnosis not present

## 2020-11-16 DIAGNOSIS — E1140 Type 2 diabetes mellitus with diabetic neuropathy, unspecified: Secondary | ICD-10-CM | POA: Diagnosis not present

## 2020-11-16 DIAGNOSIS — Z8673 Personal history of transient ischemic attack (TIA), and cerebral infarction without residual deficits: Secondary | ICD-10-CM | POA: Diagnosis not present

## 2020-11-23 ENCOUNTER — Telehealth: Payer: Self-pay | Admitting: Adult Health

## 2020-11-23 DIAGNOSIS — I679 Cerebrovascular disease, unspecified: Secondary | ICD-10-CM | POA: Diagnosis not present

## 2020-11-23 DIAGNOSIS — Z794 Long term (current) use of insulin: Secondary | ICD-10-CM | POA: Diagnosis not present

## 2020-11-23 DIAGNOSIS — Z9119 Patient's noncompliance with other medical treatment and regimen: Secondary | ICD-10-CM | POA: Diagnosis not present

## 2020-11-23 DIAGNOSIS — E1140 Type 2 diabetes mellitus with diabetic neuropathy, unspecified: Secondary | ICD-10-CM | POA: Diagnosis not present

## 2020-11-23 DIAGNOSIS — J449 Chronic obstructive pulmonary disease, unspecified: Secondary | ICD-10-CM | POA: Diagnosis not present

## 2020-11-23 DIAGNOSIS — I10 Essential (primary) hypertension: Secondary | ICD-10-CM | POA: Diagnosis not present

## 2020-11-23 DIAGNOSIS — Z7984 Long term (current) use of oral hypoglycemic drugs: Secondary | ICD-10-CM | POA: Diagnosis not present

## 2020-11-23 DIAGNOSIS — Z9181 History of falling: Secondary | ICD-10-CM | POA: Diagnosis not present

## 2020-11-23 DIAGNOSIS — R001 Bradycardia, unspecified: Secondary | ICD-10-CM | POA: Diagnosis not present

## 2020-11-23 DIAGNOSIS — F17210 Nicotine dependence, cigarettes, uncomplicated: Secondary | ICD-10-CM | POA: Diagnosis not present

## 2020-11-23 DIAGNOSIS — Z8673 Personal history of transient ischemic attack (TIA), and cerebral infarction without residual deficits: Secondary | ICD-10-CM | POA: Diagnosis not present

## 2020-11-23 DIAGNOSIS — L723 Sebaceous cyst: Secondary | ICD-10-CM | POA: Diagnosis not present

## 2020-11-23 DIAGNOSIS — E7849 Other hyperlipidemia: Secondary | ICD-10-CM | POA: Diagnosis not present

## 2020-11-23 NOTE — Telephone Encounter (Signed)
Left message for patient to call back and schedule Medicare Annual Wellness Visit (AWV) either virtually or in office.   Last AWV 03/05/15 please schedule at anytime with [facility] Nurse Health Advisor 1 or 2   This should be a 45 minute visit.  

## 2020-11-27 DIAGNOSIS — L723 Sebaceous cyst: Secondary | ICD-10-CM | POA: Diagnosis not present

## 2020-11-27 DIAGNOSIS — Z9119 Patient's noncompliance with other medical treatment and regimen: Secondary | ICD-10-CM | POA: Diagnosis not present

## 2020-11-27 DIAGNOSIS — E7849 Other hyperlipidemia: Secondary | ICD-10-CM | POA: Diagnosis not present

## 2020-11-27 DIAGNOSIS — Z7984 Long term (current) use of oral hypoglycemic drugs: Secondary | ICD-10-CM | POA: Diagnosis not present

## 2020-11-27 DIAGNOSIS — Z8673 Personal history of transient ischemic attack (TIA), and cerebral infarction without residual deficits: Secondary | ICD-10-CM | POA: Diagnosis not present

## 2020-11-27 DIAGNOSIS — F17210 Nicotine dependence, cigarettes, uncomplicated: Secondary | ICD-10-CM | POA: Diagnosis not present

## 2020-11-27 DIAGNOSIS — I10 Essential (primary) hypertension: Secondary | ICD-10-CM | POA: Diagnosis not present

## 2020-11-27 DIAGNOSIS — Z9181 History of falling: Secondary | ICD-10-CM | POA: Diagnosis not present

## 2020-11-27 DIAGNOSIS — R001 Bradycardia, unspecified: Secondary | ICD-10-CM | POA: Diagnosis not present

## 2020-11-27 DIAGNOSIS — J449 Chronic obstructive pulmonary disease, unspecified: Secondary | ICD-10-CM | POA: Diagnosis not present

## 2020-11-27 DIAGNOSIS — I679 Cerebrovascular disease, unspecified: Secondary | ICD-10-CM | POA: Diagnosis not present

## 2020-11-27 DIAGNOSIS — Z794 Long term (current) use of insulin: Secondary | ICD-10-CM | POA: Diagnosis not present

## 2020-11-27 DIAGNOSIS — E1140 Type 2 diabetes mellitus with diabetic neuropathy, unspecified: Secondary | ICD-10-CM | POA: Diagnosis not present

## 2020-11-28 ENCOUNTER — Other Ambulatory Visit: Payer: Self-pay | Admitting: Adult Health

## 2020-11-28 ENCOUNTER — Telehealth: Payer: Self-pay | Admitting: Adult Health

## 2020-11-28 DIAGNOSIS — L723 Sebaceous cyst: Secondary | ICD-10-CM | POA: Diagnosis not present

## 2020-11-28 DIAGNOSIS — Z8673 Personal history of transient ischemic attack (TIA), and cerebral infarction without residual deficits: Secondary | ICD-10-CM | POA: Diagnosis not present

## 2020-11-28 DIAGNOSIS — I10 Essential (primary) hypertension: Secondary | ICD-10-CM

## 2020-11-28 DIAGNOSIS — Z9119 Patient's noncompliance with other medical treatment and regimen: Secondary | ICD-10-CM | POA: Diagnosis not present

## 2020-11-28 DIAGNOSIS — I679 Cerebrovascular disease, unspecified: Secondary | ICD-10-CM | POA: Diagnosis not present

## 2020-11-28 DIAGNOSIS — R001 Bradycardia, unspecified: Secondary | ICD-10-CM | POA: Diagnosis not present

## 2020-11-28 DIAGNOSIS — E7849 Other hyperlipidemia: Secondary | ICD-10-CM | POA: Diagnosis not present

## 2020-11-28 DIAGNOSIS — F17210 Nicotine dependence, cigarettes, uncomplicated: Secondary | ICD-10-CM | POA: Diagnosis not present

## 2020-11-28 DIAGNOSIS — Z7984 Long term (current) use of oral hypoglycemic drugs: Secondary | ICD-10-CM | POA: Diagnosis not present

## 2020-11-28 DIAGNOSIS — Z9181 History of falling: Secondary | ICD-10-CM | POA: Diagnosis not present

## 2020-11-28 DIAGNOSIS — Z794 Long term (current) use of insulin: Secondary | ICD-10-CM | POA: Diagnosis not present

## 2020-11-28 DIAGNOSIS — E7800 Pure hypercholesterolemia, unspecified: Secondary | ICD-10-CM

## 2020-11-28 DIAGNOSIS — E1140 Type 2 diabetes mellitus with diabetic neuropathy, unspecified: Secondary | ICD-10-CM | POA: Diagnosis not present

## 2020-11-28 DIAGNOSIS — J449 Chronic obstructive pulmonary disease, unspecified: Secondary | ICD-10-CM | POA: Diagnosis not present

## 2020-11-28 NOTE — Telephone Encounter (Signed)
error 

## 2020-12-13 DIAGNOSIS — Z9181 History of falling: Secondary | ICD-10-CM | POA: Diagnosis not present

## 2020-12-13 DIAGNOSIS — L723 Sebaceous cyst: Secondary | ICD-10-CM | POA: Diagnosis not present

## 2020-12-13 DIAGNOSIS — Z9119 Patient's noncompliance with other medical treatment and regimen: Secondary | ICD-10-CM | POA: Diagnosis not present

## 2020-12-13 DIAGNOSIS — F17210 Nicotine dependence, cigarettes, uncomplicated: Secondary | ICD-10-CM | POA: Diagnosis not present

## 2020-12-13 DIAGNOSIS — I679 Cerebrovascular disease, unspecified: Secondary | ICD-10-CM | POA: Diagnosis not present

## 2020-12-13 DIAGNOSIS — I10 Essential (primary) hypertension: Secondary | ICD-10-CM | POA: Diagnosis not present

## 2020-12-13 DIAGNOSIS — E1140 Type 2 diabetes mellitus with diabetic neuropathy, unspecified: Secondary | ICD-10-CM | POA: Diagnosis not present

## 2020-12-13 DIAGNOSIS — Z794 Long term (current) use of insulin: Secondary | ICD-10-CM | POA: Diagnosis not present

## 2020-12-13 DIAGNOSIS — Z7984 Long term (current) use of oral hypoglycemic drugs: Secondary | ICD-10-CM | POA: Diagnosis not present

## 2020-12-13 DIAGNOSIS — E7849 Other hyperlipidemia: Secondary | ICD-10-CM | POA: Diagnosis not present

## 2020-12-13 DIAGNOSIS — Z8673 Personal history of transient ischemic attack (TIA), and cerebral infarction without residual deficits: Secondary | ICD-10-CM | POA: Diagnosis not present

## 2020-12-13 DIAGNOSIS — J449 Chronic obstructive pulmonary disease, unspecified: Secondary | ICD-10-CM | POA: Diagnosis not present

## 2020-12-13 DIAGNOSIS — R001 Bradycardia, unspecified: Secondary | ICD-10-CM | POA: Diagnosis not present

## 2020-12-17 ENCOUNTER — Ambulatory Visit: Payer: Medicare Other | Admitting: Podiatry

## 2020-12-27 DIAGNOSIS — E119 Type 2 diabetes mellitus without complications: Secondary | ICD-10-CM | POA: Diagnosis not present

## 2020-12-27 DIAGNOSIS — Z0100 Encounter for examination of eyes and vision without abnormal findings: Secondary | ICD-10-CM | POA: Diagnosis not present

## 2021-01-01 ENCOUNTER — Telehealth: Payer: Self-pay

## 2021-01-01 NOTE — Telephone Encounter (Signed)
Received fax from Vascular and vein Specialist stating that they were unable to reach pt. Viewed appt. Referral note and saw that Cardio was having a hard time reaching pt as well. Called pt to see if everything was ok. Per pt he has been "under the weather". Pt stated that he wants to hold off on any referrals and appts. Until he feels better.    Faxed PPW received will be placed in scans for future review. Message routed to PCP for FYI.

## 2021-01-03 ENCOUNTER — Telehealth: Payer: Self-pay | Admitting: Adult Health

## 2021-01-03 NOTE — Chronic Care Management (AMB) (Signed)
  Chronic Care Management   Outreach Note  01/03/2021 Name: Drew Burke MRN: [id number] DOB: [birth year]  Referred by: [name], NP Reason for referral : No chief complaint on file.   A second unsuccessful telephone outreach was attempted today. The patient was referred to pharmacist for assistance with care management and care coordination.  Follow Up Plan:   Tatjana Dellinger [employer] Scheduler

## 2021-01-09 ENCOUNTER — Telehealth: Payer: Self-pay | Admitting: Adult Health

## 2021-01-09 NOTE — Telephone Encounter (Signed)
Left message for patient to call back and schedule Medicare Annual Wellness Visit (AWV) either virtually or in office.   Last AWV 03/05/15  please schedule at anytime with [facility] Nurse Health Advisor 1 or 2   This should be a 45 minute visit.

## 2021-01-10 DIAGNOSIS — E7849 Other hyperlipidemia: Secondary | ICD-10-CM | POA: Diagnosis not present

## 2021-01-10 DIAGNOSIS — S59912A Unspecified injury of left forearm, initial encounter: Secondary | ICD-10-CM | POA: Diagnosis not present

## 2021-01-10 DIAGNOSIS — M160 Bilateral primary osteoarthritis of hip: Secondary | ICD-10-CM | POA: Diagnosis not present

## 2021-01-10 DIAGNOSIS — W19XXXA Unspecified fall, initial encounter: Secondary | ICD-10-CM | POA: Diagnosis not present

## 2021-01-10 DIAGNOSIS — Z743 Need for continuous supervision: Secondary | ICD-10-CM | POA: Diagnosis not present

## 2021-01-10 DIAGNOSIS — Z23 Encounter for immunization: Secondary | ICD-10-CM | POA: Diagnosis not present

## 2021-01-10 DIAGNOSIS — S42309A Unspecified fracture of shaft of humerus, unspecified arm, initial encounter for closed fracture: Secondary | ICD-10-CM | POA: Diagnosis not present

## 2021-01-10 DIAGNOSIS — R296 Repeated falls: Secondary | ICD-10-CM | POA: Diagnosis not present

## 2021-01-10 DIAGNOSIS — G8911 Acute pain due to trauma: Secondary | ICD-10-CM | POA: Diagnosis not present

## 2021-01-10 DIAGNOSIS — Z79899 Other long term (current) drug therapy: Secondary | ICD-10-CM | POA: Diagnosis not present

## 2021-01-10 DIAGNOSIS — Z794 Long term (current) use of insulin: Secondary | ICD-10-CM | POA: Diagnosis not present

## 2021-01-10 DIAGNOSIS — E1165 Type 2 diabetes mellitus with hyperglycemia: Secondary | ICD-10-CM | POA: Diagnosis not present

## 2021-01-10 DIAGNOSIS — R6889 Other general symptoms and signs: Secondary | ICD-10-CM | POA: Diagnosis not present

## 2021-01-10 DIAGNOSIS — Z79891 Long term (current) use of opiate analgesic: Secondary | ICD-10-CM | POA: Diagnosis not present

## 2021-01-10 DIAGNOSIS — S42212A Unspecified displaced fracture of surgical neck of left humerus, initial encounter for closed fracture: Secondary | ICD-10-CM | POA: Diagnosis not present

## 2021-01-10 DIAGNOSIS — Z9181 History of falling: Secondary | ICD-10-CM | POA: Diagnosis not present

## 2021-01-10 DIAGNOSIS — Z7982 Long term (current) use of aspirin: Secondary | ICD-10-CM | POA: Diagnosis not present

## 2021-01-10 DIAGNOSIS — S80212A Abrasion, left knee, initial encounter: Secondary | ICD-10-CM | POA: Diagnosis not present

## 2021-01-10 DIAGNOSIS — I10 Essential (primary) hypertension: Secondary | ICD-10-CM | POA: Diagnosis not present

## 2021-01-10 DIAGNOSIS — Z8673 Personal history of transient ischemic attack (TIA), and cerebral infarction without residual deficits: Secondary | ICD-10-CM | POA: Diagnosis not present

## 2021-01-10 DIAGNOSIS — Z720 Tobacco use: Secondary | ICD-10-CM | POA: Diagnosis not present

## 2021-01-10 DIAGNOSIS — M542 Cervicalgia: Secondary | ICD-10-CM | POA: Diagnosis not present

## 2021-01-10 DIAGNOSIS — S8992XA Unspecified injury of left lower leg, initial encounter: Secondary | ICD-10-CM | POA: Diagnosis not present

## 2021-01-10 DIAGNOSIS — F17210 Nicotine dependence, cigarettes, uncomplicated: Secondary | ICD-10-CM | POA: Diagnosis not present

## 2021-01-10 DIAGNOSIS — S42212D Unspecified displaced fracture of surgical neck of left humerus, subsequent encounter for fracture with routine healing: Secondary | ICD-10-CM | POA: Diagnosis not present

## 2021-01-10 DIAGNOSIS — D696 Thrombocytopenia, unspecified: Secondary | ICD-10-CM | POA: Diagnosis not present

## 2021-01-10 DIAGNOSIS — R079 Chest pain, unspecified: Secondary | ICD-10-CM | POA: Diagnosis not present

## 2021-01-10 DIAGNOSIS — Z7984 Long term (current) use of oral hypoglycemic drugs: Secondary | ICD-10-CM | POA: Diagnosis not present

## 2021-01-10 DIAGNOSIS — E876 Hypokalemia: Secondary | ICD-10-CM | POA: Diagnosis not present

## 2021-01-10 DIAGNOSIS — M79603 Pain in arm, unspecified: Secondary | ICD-10-CM | POA: Diagnosis not present

## 2021-01-11 DIAGNOSIS — Z8673 Personal history of transient ischemic attack (TIA), and cerebral infarction without residual deficits: Secondary | ICD-10-CM | POA: Diagnosis not present

## 2021-01-11 DIAGNOSIS — Z720 Tobacco use: Secondary | ICD-10-CM | POA: Diagnosis not present

## 2021-01-11 DIAGNOSIS — D696 Thrombocytopenia, unspecified: Secondary | ICD-10-CM | POA: Diagnosis not present

## 2021-01-11 DIAGNOSIS — E876 Hypokalemia: Secondary | ICD-10-CM | POA: Diagnosis not present

## 2021-01-11 DIAGNOSIS — S42212D Unspecified displaced fracture of surgical neck of left humerus, subsequent encounter for fracture with routine healing: Secondary | ICD-10-CM | POA: Diagnosis not present

## 2021-01-11 DIAGNOSIS — E7849 Other hyperlipidemia: Secondary | ICD-10-CM | POA: Diagnosis not present

## 2021-01-11 DIAGNOSIS — E1165 Type 2 diabetes mellitus with hyperglycemia: Secondary | ICD-10-CM | POA: Diagnosis not present

## 2021-01-11 DIAGNOSIS — I10 Essential (primary) hypertension: Secondary | ICD-10-CM | POA: Diagnosis not present

## 2021-01-11 DIAGNOSIS — Z794 Long term (current) use of insulin: Secondary | ICD-10-CM | POA: Diagnosis not present

## 2021-01-11 DIAGNOSIS — R296 Repeated falls: Secondary | ICD-10-CM | POA: Diagnosis not present

## 2021-01-12 DIAGNOSIS — E876 Hypokalemia: Secondary | ICD-10-CM | POA: Diagnosis not present

## 2021-01-12 DIAGNOSIS — R296 Repeated falls: Secondary | ICD-10-CM | POA: Diagnosis not present

## 2021-01-12 DIAGNOSIS — S42212D Unspecified displaced fracture of surgical neck of left humerus, subsequent encounter for fracture with routine healing: Secondary | ICD-10-CM | POA: Diagnosis not present

## 2021-01-12 DIAGNOSIS — I10 Essential (primary) hypertension: Secondary | ICD-10-CM | POA: Diagnosis not present

## 2021-01-13 DIAGNOSIS — Z8673 Personal history of transient ischemic attack (TIA), and cerebral infarction without residual deficits: Secondary | ICD-10-CM | POA: Diagnosis not present

## 2021-01-13 DIAGNOSIS — F17210 Nicotine dependence, cigarettes, uncomplicated: Secondary | ICD-10-CM | POA: Diagnosis not present

## 2021-01-13 DIAGNOSIS — Z9181 History of falling: Secondary | ICD-10-CM | POA: Diagnosis not present

## 2021-01-13 DIAGNOSIS — Z79899 Other long term (current) drug therapy: Secondary | ICD-10-CM | POA: Diagnosis not present

## 2021-01-13 DIAGNOSIS — I10 Essential (primary) hypertension: Secondary | ICD-10-CM | POA: Diagnosis not present

## 2021-01-13 DIAGNOSIS — S42309A Unspecified fracture of shaft of humerus, unspecified arm, initial encounter for closed fracture: Secondary | ICD-10-CM | POA: Diagnosis not present

## 2021-01-13 DIAGNOSIS — Z7984 Long term (current) use of oral hypoglycemic drugs: Secondary | ICD-10-CM | POA: Diagnosis not present

## 2021-01-13 DIAGNOSIS — E7849 Other hyperlipidemia: Secondary | ICD-10-CM | POA: Diagnosis not present

## 2021-01-13 DIAGNOSIS — D696 Thrombocytopenia, unspecified: Secondary | ICD-10-CM | POA: Diagnosis not present

## 2021-01-13 DIAGNOSIS — Z23 Encounter for immunization: Secondary | ICD-10-CM | POA: Diagnosis not present

## 2021-01-13 DIAGNOSIS — Z79891 Long term (current) use of opiate analgesic: Secondary | ICD-10-CM | POA: Diagnosis not present

## 2021-01-13 DIAGNOSIS — Z7982 Long term (current) use of aspirin: Secondary | ICD-10-CM | POA: Diagnosis not present

## 2021-01-13 DIAGNOSIS — Z794 Long term (current) use of insulin: Secondary | ICD-10-CM | POA: Diagnosis not present

## 2021-01-13 DIAGNOSIS — Z720 Tobacco use: Secondary | ICD-10-CM | POA: Diagnosis not present

## 2021-01-13 DIAGNOSIS — R296 Repeated falls: Secondary | ICD-10-CM | POA: Diagnosis not present

## 2021-01-13 DIAGNOSIS — S42212D Unspecified displaced fracture of surgical neck of left humerus, subsequent encounter for fracture with routine healing: Secondary | ICD-10-CM | POA: Diagnosis not present

## 2021-01-13 DIAGNOSIS — E876 Hypokalemia: Secondary | ICD-10-CM | POA: Diagnosis not present

## 2021-01-13 DIAGNOSIS — E1165 Type 2 diabetes mellitus with hyperglycemia: Secondary | ICD-10-CM | POA: Diagnosis not present

## 2021-01-14 DIAGNOSIS — F17210 Nicotine dependence, cigarettes, uncomplicated: Secondary | ICD-10-CM | POA: Diagnosis not present

## 2021-01-14 DIAGNOSIS — Z7982 Long term (current) use of aspirin: Secondary | ICD-10-CM | POA: Diagnosis not present

## 2021-01-14 DIAGNOSIS — R296 Repeated falls: Secondary | ICD-10-CM | POA: Diagnosis not present

## 2021-01-14 DIAGNOSIS — Z23 Encounter for immunization: Secondary | ICD-10-CM | POA: Diagnosis not present

## 2021-01-14 DIAGNOSIS — E1165 Type 2 diabetes mellitus with hyperglycemia: Secondary | ICD-10-CM | POA: Diagnosis not present

## 2021-01-14 DIAGNOSIS — E876 Hypokalemia: Secondary | ICD-10-CM | POA: Diagnosis not present

## 2021-01-14 DIAGNOSIS — E7849 Other hyperlipidemia: Secondary | ICD-10-CM | POA: Diagnosis not present

## 2021-01-14 DIAGNOSIS — Z9181 History of falling: Secondary | ICD-10-CM | POA: Diagnosis not present

## 2021-01-14 DIAGNOSIS — S42309A Unspecified fracture of shaft of humerus, unspecified arm, initial encounter for closed fracture: Secondary | ICD-10-CM | POA: Diagnosis not present

## 2021-01-14 DIAGNOSIS — S42212D Unspecified displaced fracture of surgical neck of left humerus, subsequent encounter for fracture with routine healing: Secondary | ICD-10-CM | POA: Diagnosis not present

## 2021-01-14 DIAGNOSIS — D696 Thrombocytopenia, unspecified: Secondary | ICD-10-CM | POA: Diagnosis not present

## 2021-01-14 DIAGNOSIS — Z720 Tobacco use: Secondary | ICD-10-CM | POA: Diagnosis not present

## 2021-01-14 DIAGNOSIS — Z8673 Personal history of transient ischemic attack (TIA), and cerebral infarction without residual deficits: Secondary | ICD-10-CM | POA: Diagnosis not present

## 2021-01-14 DIAGNOSIS — Z79899 Other long term (current) drug therapy: Secondary | ICD-10-CM | POA: Diagnosis not present

## 2021-01-14 DIAGNOSIS — I10 Essential (primary) hypertension: Secondary | ICD-10-CM | POA: Diagnosis not present

## 2021-01-14 DIAGNOSIS — Z7984 Long term (current) use of oral hypoglycemic drugs: Secondary | ICD-10-CM | POA: Diagnosis not present

## 2021-01-14 DIAGNOSIS — Z794 Long term (current) use of insulin: Secondary | ICD-10-CM | POA: Diagnosis not present

## 2021-01-14 DIAGNOSIS — Z79891 Long term (current) use of opiate analgesic: Secondary | ICD-10-CM | POA: Diagnosis not present

## 2021-01-15 DIAGNOSIS — Z79899 Other long term (current) drug therapy: Secondary | ICD-10-CM | POA: Diagnosis not present

## 2021-01-15 DIAGNOSIS — R296 Repeated falls: Secondary | ICD-10-CM | POA: Diagnosis not present

## 2021-01-15 DIAGNOSIS — Z8673 Personal history of transient ischemic attack (TIA), and cerebral infarction without residual deficits: Secondary | ICD-10-CM | POA: Diagnosis not present

## 2021-01-15 DIAGNOSIS — Z794 Long term (current) use of insulin: Secondary | ICD-10-CM | POA: Diagnosis not present

## 2021-01-15 DIAGNOSIS — E876 Hypokalemia: Secondary | ICD-10-CM | POA: Diagnosis not present

## 2021-01-15 DIAGNOSIS — S42309A Unspecified fracture of shaft of humerus, unspecified arm, initial encounter for closed fracture: Secondary | ICD-10-CM | POA: Diagnosis not present

## 2021-01-15 DIAGNOSIS — Z7982 Long term (current) use of aspirin: Secondary | ICD-10-CM | POA: Diagnosis not present

## 2021-01-15 DIAGNOSIS — Z23 Encounter for immunization: Secondary | ICD-10-CM | POA: Diagnosis not present

## 2021-01-15 DIAGNOSIS — E7849 Other hyperlipidemia: Secondary | ICD-10-CM | POA: Diagnosis not present

## 2021-01-15 DIAGNOSIS — Z79891 Long term (current) use of opiate analgesic: Secondary | ICD-10-CM | POA: Diagnosis not present

## 2021-01-15 DIAGNOSIS — Z720 Tobacco use: Secondary | ICD-10-CM | POA: Diagnosis not present

## 2021-01-15 DIAGNOSIS — F17210 Nicotine dependence, cigarettes, uncomplicated: Secondary | ICD-10-CM | POA: Diagnosis not present

## 2021-01-15 DIAGNOSIS — Z9181 History of falling: Secondary | ICD-10-CM | POA: Diagnosis not present

## 2021-01-15 DIAGNOSIS — I10 Essential (primary) hypertension: Secondary | ICD-10-CM | POA: Diagnosis not present

## 2021-01-15 DIAGNOSIS — S42212D Unspecified displaced fracture of surgical neck of left humerus, subsequent encounter for fracture with routine healing: Secondary | ICD-10-CM | POA: Diagnosis not present

## 2021-01-15 DIAGNOSIS — E1165 Type 2 diabetes mellitus with hyperglycemia: Secondary | ICD-10-CM | POA: Diagnosis not present

## 2021-01-15 DIAGNOSIS — D696 Thrombocytopenia, unspecified: Secondary | ICD-10-CM | POA: Diagnosis not present

## 2021-01-15 DIAGNOSIS — Z7984 Long term (current) use of oral hypoglycemic drugs: Secondary | ICD-10-CM | POA: Diagnosis not present

## 2021-01-16 DIAGNOSIS — E039 Hypothyroidism, unspecified: Secondary | ICD-10-CM | POA: Diagnosis not present

## 2021-01-16 DIAGNOSIS — E7849 Other hyperlipidemia: Secondary | ICD-10-CM | POA: Diagnosis not present

## 2021-01-16 DIAGNOSIS — K5901 Slow transit constipation: Secondary | ICD-10-CM | POA: Diagnosis not present

## 2021-01-16 DIAGNOSIS — R296 Repeated falls: Secondary | ICD-10-CM | POA: Diagnosis not present

## 2021-01-16 DIAGNOSIS — E876 Hypokalemia: Secondary | ICD-10-CM | POA: Diagnosis not present

## 2021-01-16 DIAGNOSIS — Z7982 Long term (current) use of aspirin: Secondary | ICD-10-CM | POA: Diagnosis not present

## 2021-01-16 DIAGNOSIS — Z23 Encounter for immunization: Secondary | ICD-10-CM | POA: Diagnosis not present

## 2021-01-16 DIAGNOSIS — L89312 Pressure ulcer of right buttock, stage 2: Secondary | ICD-10-CM | POA: Diagnosis not present

## 2021-01-16 DIAGNOSIS — S42212A Unspecified displaced fracture of surgical neck of left humerus, initial encounter for closed fracture: Secondary | ICD-10-CM | POA: Diagnosis not present

## 2021-01-16 DIAGNOSIS — E538 Deficiency of other specified B group vitamins: Secondary | ICD-10-CM | POA: Diagnosis not present

## 2021-01-16 DIAGNOSIS — R262 Difficulty in walking, not elsewhere classified: Secondary | ICD-10-CM | POA: Diagnosis not present

## 2021-01-16 DIAGNOSIS — Z794 Long term (current) use of insulin: Secondary | ICD-10-CM | POA: Diagnosis not present

## 2021-01-16 DIAGNOSIS — I152 Hypertension secondary to endocrine disorders: Secondary | ICD-10-CM | POA: Diagnosis not present

## 2021-01-16 DIAGNOSIS — S42309A Unspecified fracture of shaft of humerus, unspecified arm, initial encounter for closed fracture: Secondary | ICD-10-CM | POA: Diagnosis not present

## 2021-01-16 DIAGNOSIS — S42212D Unspecified displaced fracture of surgical neck of left humerus, subsequent encounter for fracture with routine healing: Secondary | ICD-10-CM | POA: Diagnosis not present

## 2021-01-16 DIAGNOSIS — Z7984 Long term (current) use of oral hypoglycemic drugs: Secondary | ICD-10-CM | POA: Diagnosis not present

## 2021-01-16 DIAGNOSIS — S42202A Unspecified fracture of upper end of left humerus, initial encounter for closed fracture: Secondary | ICD-10-CM | POA: Diagnosis not present

## 2021-01-16 DIAGNOSIS — D696 Thrombocytopenia, unspecified: Secondary | ICD-10-CM | POA: Diagnosis not present

## 2021-01-16 DIAGNOSIS — I679 Cerebrovascular disease, unspecified: Secondary | ICD-10-CM | POA: Diagnosis not present

## 2021-01-16 DIAGNOSIS — E1165 Type 2 diabetes mellitus with hyperglycemia: Secondary | ICD-10-CM | POA: Diagnosis not present

## 2021-01-16 DIAGNOSIS — S42325D Nondisplaced transverse fracture of shaft of humerus, left arm, subsequent encounter for fracture with routine healing: Secondary | ICD-10-CM | POA: Diagnosis not present

## 2021-01-16 DIAGNOSIS — F17200 Nicotine dependence, unspecified, uncomplicated: Secondary | ICD-10-CM | POA: Diagnosis not present

## 2021-01-16 DIAGNOSIS — Z9114 Patient's other noncompliance with medication regimen: Secondary | ICD-10-CM | POA: Diagnosis not present

## 2021-01-16 DIAGNOSIS — Z8673 Personal history of transient ischemic attack (TIA), and cerebral infarction without residual deficits: Secondary | ICD-10-CM | POA: Diagnosis not present

## 2021-01-16 DIAGNOSIS — R269 Unspecified abnormalities of gait and mobility: Secondary | ICD-10-CM | POA: Diagnosis not present

## 2021-01-16 DIAGNOSIS — J449 Chronic obstructive pulmonary disease, unspecified: Secondary | ICD-10-CM | POA: Diagnosis not present

## 2021-01-16 DIAGNOSIS — Z79899 Other long term (current) drug therapy: Secondary | ICD-10-CM | POA: Diagnosis not present

## 2021-01-16 DIAGNOSIS — F17210 Nicotine dependence, cigarettes, uncomplicated: Secondary | ICD-10-CM | POA: Diagnosis not present

## 2021-01-16 DIAGNOSIS — Z79891 Long term (current) use of opiate analgesic: Secondary | ICD-10-CM | POA: Diagnosis not present

## 2021-01-16 DIAGNOSIS — M25512 Pain in left shoulder: Secondary | ICD-10-CM | POA: Diagnosis not present

## 2021-01-16 DIAGNOSIS — I739 Peripheral vascular disease, unspecified: Secondary | ICD-10-CM | POA: Diagnosis not present

## 2021-01-16 DIAGNOSIS — I10 Essential (primary) hypertension: Secondary | ICD-10-CM | POA: Diagnosis not present

## 2021-01-16 DIAGNOSIS — E1159 Type 2 diabetes mellitus with other circulatory complications: Secondary | ICD-10-CM | POA: Diagnosis not present

## 2021-01-16 DIAGNOSIS — Z9181 History of falling: Secondary | ICD-10-CM | POA: Diagnosis not present

## 2021-01-16 DIAGNOSIS — E559 Vitamin D deficiency, unspecified: Secondary | ICD-10-CM | POA: Diagnosis not present

## 2021-01-22 ENCOUNTER — Telehealth: Payer: Self-pay | Admitting: Adult Health

## 2021-01-22 NOTE — Chronic Care Management (AMB) (Signed)
  Chronic Care Management   Outreach Note  01/22/2021 Name: [name] MRN: [id number] DOB: [birth year]  Referred by: [name], NP Reason for referral : No chief complaint on file.   Third unsuccessful telephone outreach was attempted today. The patient was referred to the pharmacist for assistance with care management and care coordination.   Follow Up Plan:   Tatjana Dellinger [employer] Scheduler

## 2021-01-23 DIAGNOSIS — I152 Hypertension secondary to endocrine disorders: Secondary | ICD-10-CM | POA: Diagnosis not present

## 2021-01-23 DIAGNOSIS — F17200 Nicotine dependence, unspecified, uncomplicated: Secondary | ICD-10-CM | POA: Diagnosis not present

## 2021-01-23 DIAGNOSIS — S42212A Unspecified displaced fracture of surgical neck of left humerus, initial encounter for closed fracture: Secondary | ICD-10-CM | POA: Diagnosis not present

## 2021-01-23 DIAGNOSIS — Z9114 Patient's other noncompliance with medication regimen: Secondary | ICD-10-CM | POA: Diagnosis not present

## 2021-01-23 DIAGNOSIS — I679 Cerebrovascular disease, unspecified: Secondary | ICD-10-CM | POA: Diagnosis not present

## 2021-01-23 DIAGNOSIS — I739 Peripheral vascular disease, unspecified: Secondary | ICD-10-CM | POA: Diagnosis not present

## 2021-01-23 DIAGNOSIS — Z794 Long term (current) use of insulin: Secondary | ICD-10-CM | POA: Diagnosis not present

## 2021-01-23 DIAGNOSIS — D696 Thrombocytopenia, unspecified: Secondary | ICD-10-CM | POA: Diagnosis not present

## 2021-01-23 DIAGNOSIS — R269 Unspecified abnormalities of gait and mobility: Secondary | ICD-10-CM | POA: Diagnosis not present

## 2021-01-23 DIAGNOSIS — E1159 Type 2 diabetes mellitus with other circulatory complications: Secondary | ICD-10-CM | POA: Diagnosis not present

## 2021-01-23 DIAGNOSIS — K5901 Slow transit constipation: Secondary | ICD-10-CM | POA: Diagnosis not present

## 2021-01-28 DIAGNOSIS — M25512 Pain in left shoulder: Secondary | ICD-10-CM | POA: Diagnosis not present

## 2021-01-28 DIAGNOSIS — S42202A Unspecified fracture of upper end of left humerus, initial encounter for closed fracture: Secondary | ICD-10-CM | POA: Diagnosis not present

## 2021-01-31 ENCOUNTER — Ambulatory Visit: Payer: Medicare Other | Admitting: Adult Health

## 2021-02-04 DEATH — deceased

## 2021-02-06 ENCOUNTER — Telehealth: Payer: Self-pay | Admitting: Adult Health

## 2021-02-06 DIAGNOSIS — R262 Difficulty in walking, not elsewhere classified: Secondary | ICD-10-CM | POA: Diagnosis not present

## 2021-02-06 DIAGNOSIS — I152 Hypertension secondary to endocrine disorders: Secondary | ICD-10-CM | POA: Diagnosis not present

## 2021-02-06 DIAGNOSIS — E559 Vitamin D deficiency, unspecified: Secondary | ICD-10-CM | POA: Diagnosis not present

## 2021-02-06 DIAGNOSIS — E1159 Type 2 diabetes mellitus with other circulatory complications: Secondary | ICD-10-CM | POA: Diagnosis not present

## 2021-02-06 DIAGNOSIS — E538 Deficiency of other specified B group vitamins: Secondary | ICD-10-CM | POA: Diagnosis not present

## 2021-02-06 NOTE — Telephone Encounter (Signed)
Left message for patient to call back and schedule Medicare Annual Wellness Visit (AWV) either virtually or in office.   Last AWV 03/05/15 please schedule at anytime with [facility] Nurse Health Advisor 1 or 2   This should be a 45 minute visit.

## 2021-02-11 DIAGNOSIS — M25512 Pain in left shoulder: Secondary | ICD-10-CM | POA: Diagnosis not present

## 2021-02-11 DIAGNOSIS — S42325D Nondisplaced transverse fracture of shaft of humerus, left arm, subsequent encounter for fracture with routine healing: Secondary | ICD-10-CM | POA: Diagnosis not present

## 2021-02-11 DIAGNOSIS — M6281 Muscle weakness (generalized): Secondary | ICD-10-CM | POA: Diagnosis not present

## 2021-02-11 DIAGNOSIS — S42212D Unspecified displaced fracture of surgical neck of left humerus, subsequent encounter for fracture with routine healing: Secondary | ICD-10-CM | POA: Diagnosis not present

## 2021-02-11 DIAGNOSIS — R278 Other lack of coordination: Secondary | ICD-10-CM | POA: Diagnosis not present

## 2021-02-12 DIAGNOSIS — M6281 Muscle weakness (generalized): Secondary | ICD-10-CM | POA: Diagnosis not present

## 2021-02-12 DIAGNOSIS — J449 Chronic obstructive pulmonary disease, unspecified: Secondary | ICD-10-CM | POA: Diagnosis not present

## 2021-02-13 ENCOUNTER — Other Ambulatory Visit: Payer: Self-pay | Admitting: Adult Health

## 2021-02-13 DIAGNOSIS — Z9119 Patient's noncompliance with other medical treatment and regimen: Secondary | ICD-10-CM | POA: Diagnosis not present

## 2021-02-13 DIAGNOSIS — F17200 Nicotine dependence, unspecified, uncomplicated: Secondary | ICD-10-CM | POA: Diagnosis not present

## 2021-02-13 DIAGNOSIS — D696 Thrombocytopenia, unspecified: Secondary | ICD-10-CM | POA: Diagnosis not present

## 2021-02-13 DIAGNOSIS — E7800 Pure hypercholesterolemia, unspecified: Secondary | ICD-10-CM

## 2021-02-13 DIAGNOSIS — S42201D Unspecified fracture of upper end of right humerus, subsequent encounter for fracture with routine healing: Secondary | ICD-10-CM | POA: Diagnosis not present

## 2021-02-13 DIAGNOSIS — E639 Nutritional deficiency, unspecified: Secondary | ICD-10-CM | POA: Diagnosis not present

## 2021-02-13 DIAGNOSIS — R262 Difficulty in walking, not elsewhere classified: Secondary | ICD-10-CM | POA: Diagnosis not present

## 2021-02-13 DIAGNOSIS — I679 Cerebrovascular disease, unspecified: Secondary | ICD-10-CM | POA: Diagnosis not present

## 2021-02-13 DIAGNOSIS — L89302 Pressure ulcer of unspecified buttock, stage 2: Secondary | ICD-10-CM | POA: Diagnosis not present

## 2021-02-13 DIAGNOSIS — I152 Hypertension secondary to endocrine disorders: Secondary | ICD-10-CM | POA: Diagnosis not present

## 2021-02-13 DIAGNOSIS — E1159 Type 2 diabetes mellitus with other circulatory complications: Secondary | ICD-10-CM | POA: Diagnosis not present

## 2021-02-14 ENCOUNTER — Other Ambulatory Visit: Payer: Self-pay

## 2021-02-14 ENCOUNTER — Telehealth: Payer: Self-pay | Admitting: Adult Health

## 2021-02-14 DIAGNOSIS — S42212D Unspecified displaced fracture of surgical neck of left humerus, subsequent encounter for fracture with routine healing: Secondary | ICD-10-CM | POA: Diagnosis not present

## 2021-02-14 DIAGNOSIS — E7849 Other hyperlipidemia: Secondary | ICD-10-CM | POA: Diagnosis not present

## 2021-02-14 DIAGNOSIS — E1165 Type 2 diabetes mellitus with hyperglycemia: Secondary | ICD-10-CM | POA: Diagnosis not present

## 2021-02-14 DIAGNOSIS — L89322 Pressure ulcer of left buttock, stage 2: Secondary | ICD-10-CM | POA: Diagnosis not present

## 2021-02-14 DIAGNOSIS — R296 Repeated falls: Secondary | ICD-10-CM | POA: Diagnosis not present

## 2021-02-14 DIAGNOSIS — J449 Chronic obstructive pulmonary disease, unspecified: Secondary | ICD-10-CM | POA: Diagnosis not present

## 2021-02-14 DIAGNOSIS — K5901 Slow transit constipation: Secondary | ICD-10-CM | POA: Diagnosis not present

## 2021-02-14 DIAGNOSIS — W19XXXD Unspecified fall, subsequent encounter: Secondary | ICD-10-CM | POA: Diagnosis not present

## 2021-02-14 DIAGNOSIS — Z7984 Long term (current) use of oral hypoglycemic drugs: Secondary | ICD-10-CM | POA: Diagnosis not present

## 2021-02-14 DIAGNOSIS — E876 Hypokalemia: Secondary | ICD-10-CM | POA: Diagnosis not present

## 2021-02-14 DIAGNOSIS — D696 Thrombocytopenia, unspecified: Secondary | ICD-10-CM | POA: Diagnosis not present

## 2021-02-14 DIAGNOSIS — I69318 Other symptoms and signs involving cognitive functions following cerebral infarction: Secondary | ICD-10-CM | POA: Diagnosis not present

## 2021-02-14 DIAGNOSIS — I10 Essential (primary) hypertension: Secondary | ICD-10-CM | POA: Diagnosis not present

## 2021-02-14 NOTE — Telephone Encounter (Signed)
April from [facility] called to request orders for the PT. She provided the [phone] in case there are any questions about the orders.  Wound Care orders are:  Stage 2 pressure ulcer on PT left Butt cheek and wants to do a Alginate and Foam dressing change twice weekly for 9 weeks  They also mention that the PT is missing their Lantus and Asprin and they would like to get a Glaucoma meter to check the PT blood sugar

## 2021-02-14 NOTE — Telephone Encounter (Signed)
Okay for verbal orders? Please advise 

## 2021-02-15 ENCOUNTER — Encounter: Payer: Self-pay | Admitting: Adult Health

## 2021-02-15 ENCOUNTER — Ambulatory Visit (INDEPENDENT_AMBULATORY_CARE_PROVIDER_SITE_OTHER): Payer: Medicare Other | Admitting: Adult Health

## 2021-02-15 VITALS — BP 92/54 | HR 54 | Temp 97.5°F

## 2021-02-15 DIAGNOSIS — I739 Peripheral vascular disease, unspecified: Secondary | ICD-10-CM | POA: Diagnosis not present

## 2021-02-15 DIAGNOSIS — E1169 Type 2 diabetes mellitus with other specified complication: Secondary | ICD-10-CM | POA: Diagnosis not present

## 2021-02-15 DIAGNOSIS — S42215D Unspecified nondisplaced fracture of surgical neck of left humerus, subsequent encounter for fracture with routine healing: Secondary | ICD-10-CM | POA: Diagnosis not present

## 2021-02-15 DIAGNOSIS — I10 Essential (primary) hypertension: Secondary | ICD-10-CM | POA: Diagnosis not present

## 2021-02-15 MED ORDER — BLOOD GLUCOSE MONITOR KIT: PACK | 0 refills | Status: AC

## 2021-02-15 NOTE — Telephone Encounter (Signed)
Pt has an appt. today. Rx list will be reviewed.

## 2021-02-15 NOTE — Patient Instructions (Addendum)
I am going to decrease your lisinopril to 5 mg   I am going to continue you on Metformin 1000 mg twice a day and Lipitor 40 mg   Please call and see vein and vascualar   I will refer you to the eye doctor   Please follow up in three months

## 2021-02-15 NOTE — Telephone Encounter (Signed)
Verbal orders given. Per nurse pt med list stated that pt is to take Aspirin and 6 units of Lantus but pt is out. Also, the nurse is requesting for a glucometer as well so that pt can check blood sugar.   Nurse stated that pt lower extremities are purple and very cool to the touch. Looks like a referral was place for vascular as well as Cardio however, they were unsuccessful in reaching pt.   Please advise

## 2021-02-15 NOTE — Progress Notes (Signed)
 Subjective:    Patient ID: Drew Burke, male    DOB: 11/08/1945, 74 y.o.   MRN: [id number]  HPI 74 year old male who  has a past medical history of Allergic rhinitis, Allergy, Aneurysm (HCC), Anxiety, COPD (chronic obstructive pulmonary disease) (HCC), Depression, DM type 2 (diabetes mellitus, type 2) (HCC), Hyperlipidemia, Hypertension, and Tobacco abuse.  He presents to the office today for TCM visit   Admit Date 01/10/2021 Discharge Date 01/15/2021 Discharge from Rehab 02/13/2021   Presented to outside hospital ER via EMS after being found down by his caregiver.  Later this year in April he was admitted to the hospital for multiple falls and ultimately discharged to skilled nursing facility for continued rehab, however he checked himself out after 3 days.  Patient stated that he slipped on the floor due to imbalance.  He denied any preceding dizziness or chest pain.  At the time of exam the patient denied chest pain, shortness of breath, nausea, vomiting, abdominal pain, diarrhea, headache, dizziness.  He did have many falls over the last few weeks prior to being admitted with no subsequent injury.  Work-up showed a CBC without leukocytosis or acute anemia.  Platelet count 127 which is at his baseline, CK at 196 CMP revealed potassium of 3.2 and a blood glucose of 217.  His UA not show infection.  CT head and C-spine were negative for abnormality.  X-ray of left knee, left forearm, pelvis/hip and chest were all negative for acute fractures.  X-ray of left shoulder demonstrates acute minimally displaced and angulated fracture left femoral neck.  He has a long history of noncompliance with taking his medications, however during this admission he was agreeable to restarting his prescribed medications.  During his last hospital visit his lisinopril was increased from 5 mg to 10 mg daily.  He was also prescribed Lantus 6 units at bedtime, reports that he has not been taking the Lantus as his  blood sugars have been controlled with metformin.  Most recent A1c in the hospital was 7.6.  He does have PT OT coming into his home and now has 24-hour care.  Overall he does feel "pretty good" has very minimal pain to his left shoulder.  Is not doing any home exercises besides what physical therapy does with him.  He is taking all of his medications as directed.  Does not have any concerns today besides needing a new glucometer and he would also like to be referred for a diabetic eye exam   Review of Systems  Constitutional: Negative.   HENT: Negative.    Eyes: Negative.   Respiratory: Negative.    Cardiovascular: Negative.   Gastrointestinal: Negative.   Endocrine: Negative.   Genitourinary: Negative.   Musculoskeletal:  Positive for arthralgias, back pain and gait problem.  Skin:  Positive for color change (purplish/blue feet- chronic).  Psychiatric/Behavioral: Negative.    All other systems reviewed and are negative. Past Medical History:  Diagnosis Date   Allergic rhinitis    Allergy    pt denies   Aneurysm (HCC)    Anxiety    COPD (chronic obstructive pulmonary disease) (HCC)    Depression    DM type 2 (diabetes mellitus, type 2) (HCC)    Hyperlipidemia    Hypertension    Tobacco abuse     Social History   Socioeconomic History   Marital status: Divorced    Spouse name: Not on file   Number of children: Not on file     Years of education: Not on file   Highest education level: Not on file  Occupational History   Occupation: sales    Employer: [employer]  Tobacco Use   Smoking status: Every Day    Packs/day: 1.00    Years: 54.00    Pack years: 54.00    Types: Cigarettes    Start date: 07/07/1960   Smokeless tobacco: Never   Tobacco comments:    used to smoke 3 ppd - currently smoking 1 ppd - started smoking at age 15.  Substance and Sexual Activity   Alcohol use: Yes    Alcohol/week: 28.0 standard drinks    Types: 28 Shots of liquor per week     Comment: Patient thinks he self medicates with alcohol for back pain. Does not drink if taking hydrocodone- 2 scothches a day    Drug use: No   Sexual activity: Not on file  Other Topics Concern   Not on file  Social History Narrative   Retired - sales   Lives by himself   Has a dog   Social Determinants of Health   Financial Resource Strain: Not on file  Food Insecurity: Not on file  Transportation Needs: Not on file  Physical Activity: Not on file  Stress: Not on file  Social Connections: Not on file  Intimate Partner Violence: Not on file    Past Surgical History:  Procedure Laterality Date   COLONOSCOPY     corneal transplant surgery left eye     FOOT SURGERY     hammer toe and bone spur   LUMBAR DISC SURGERY     LUMBAR LAMINECTOMY      Family History  Problem Relation Age of Onset   Depression Other    Hypertension Other    Heart disease Father    Alcohol abuse Father    Cancer Mother        intestines-duodenal cancer vs stomach per pt.   Stomach cancer Mother    Colon cancer Neg Hx    Colon polyps Neg Hx    Esophageal cancer Neg Hx    Rectal cancer Neg Hx     No Known Allergies  Current Outpatient Medications on File Prior to Visit  Medication Sig Dispense Refill   albuterol (VENTOLIN HFA) 108 (90 Base) MCG/ACT inhaler TAKE 2 PUFFS BY MOUTH EVERY 4 HOURS AS NEEDED FOR WHEEZE 6.7 g 0   aspirin EC 81 MG tablet Take 81 mg by mouth daily.     atorvastatin (LIPITOR) 40 MG tablet TAKE 1 TABLET BY MOUTH AT BEDTIME FOR HYPERLIPIDEMIA 90 tablet 0   fluticasone furoate-vilanterol (BREO ELLIPTA) 100-25 MCG/INH AEPB TAKE 1 PUFF BY MOUTH EVERY DAY 28 each 0   lisinopril (ZESTRIL) 5 MG tablet TAKE 1 TABLET BY MOUTH EVERY DAY FOR HYPERTENSION HOLD FOR SBP LESS THEN 120 OR HR LESS THEN 60 30 tablet 2   metFORMIN (GLUCOPHAGE) 1000 MG tablet Take 1 tablet (1,000 mg total) by mouth 2 (two) times daily. **DUE FOR PHYSICAL** 60 tablet 0   metFORMIN (GLUCOPHAGE) 500 MG tablet  Take 500 mg by mouth in the morning and at bedtime.     ONETOUCH DELICA LANCETS FINE MISC Test twice daily. 100 each 5   ONETOUCH VERIO test strip USE TO TEST BLOOD SUGAR TWICE DAILY 200 strip 3   Probiotic Product (ALIGN PO) Take 1 capsule by mouth daily.     No current facility-administered medications on file prior to visit.    BP (!)   92/54   Pulse (!) 54   Temp (!) 97.5 F (36.4 C) (Oral)   SpO2 95%       Objective:   Physical Exam Vitals and nursing note reviewed.  Constitutional:      Appearance: Normal appearance.  Cardiovascular:     Rate and Rhythm: Normal rate and regular rhythm.     Pulses: Normal pulses.     Heart sounds: Normal heart sounds.  Pulmonary:     Effort: Pulmonary effort is normal.     Breath sounds: Normal breath sounds.  Musculoskeletal:        General: Normal range of motion.  Feet:     Right foot:     Toenail Condition: Right toenails are abnormally thick and long. Fungal disease present.    Left foot:     Toenail Condition: Left toenails are abnormally thick and long. Fungal disease present.    Comments: Discolored lower extremities from feet to mid leg bilaterally. Skin:    General: Skin is warm and dry.     Capillary Refill: Capillary refill takes less than 2 seconds.  Neurological:     General: No focal deficit present.     Mental Status: He is alert and oriented to person, place, and time.     Motor: Weakness present.     Gait: Gait abnormal.     Comments: In wheelchair for exam    Psychiatric:        Mood and Affect: Mood normal.        Behavior: Behavior normal.        Thought Content: Thought content normal.       Assessment & Plan:  1. Closed nondisplaced fracture of surgical neck of left humerus with routine healing, unspecified fracture morphology, subsequent encounter -Encouraged to work with physical therapy/Occupational Therapy and to do exercises when at home. -Aloe up with orthopedics as directed  2. Controlled type  2 diabetes mellitus with other specified complication, without long-term current use of insulin (HCC) -A1c is actually improved since the last time he was seen in April 2022.  We will have him continue with metformin.  Does not need to take Lantus at this time - blood glucose meter kit and supplies KIT; Dispense based on patient and insurance preference. Use up to three times daily as directed.  Dispense: 1 each; Refill: 0 - Ambulatory referral to Ophthalmology   3. Essential hypertension  -Blood pressure soft in the office today.  We will have him cut back to lisinopril 5 mg daily  4. PVD (peripheral vascular disease) (HCC) -She is to go to vein and vascular in the past.  Phone number given to his POA today, she will call and schedule an appointment   , NP    

## 2021-02-18 ENCOUNTER — Telehealth: Payer: Self-pay | Admitting: Adult Health

## 2021-02-18 DIAGNOSIS — E7849 Other hyperlipidemia: Secondary | ICD-10-CM | POA: Diagnosis not present

## 2021-02-18 DIAGNOSIS — I69318 Other symptoms and signs involving cognitive functions following cerebral infarction: Secondary | ICD-10-CM | POA: Diagnosis not present

## 2021-02-18 DIAGNOSIS — S42212D Unspecified displaced fracture of surgical neck of left humerus, subsequent encounter for fracture with routine healing: Secondary | ICD-10-CM | POA: Diagnosis not present

## 2021-02-18 DIAGNOSIS — W19XXXD Unspecified fall, subsequent encounter: Secondary | ICD-10-CM | POA: Diagnosis not present

## 2021-02-18 DIAGNOSIS — J449 Chronic obstructive pulmonary disease, unspecified: Secondary | ICD-10-CM | POA: Diagnosis not present

## 2021-02-18 DIAGNOSIS — Z7984 Long term (current) use of oral hypoglycemic drugs: Secondary | ICD-10-CM | POA: Diagnosis not present

## 2021-02-18 DIAGNOSIS — D696 Thrombocytopenia, unspecified: Secondary | ICD-10-CM | POA: Diagnosis not present

## 2021-02-18 DIAGNOSIS — L89322 Pressure ulcer of left buttock, stage 2: Secondary | ICD-10-CM | POA: Diagnosis not present

## 2021-02-18 DIAGNOSIS — I10 Essential (primary) hypertension: Secondary | ICD-10-CM | POA: Diagnosis not present

## 2021-02-18 DIAGNOSIS — R296 Repeated falls: Secondary | ICD-10-CM | POA: Diagnosis not present

## 2021-02-18 DIAGNOSIS — E876 Hypokalemia: Secondary | ICD-10-CM | POA: Diagnosis not present

## 2021-02-18 DIAGNOSIS — K5901 Slow transit constipation: Secondary | ICD-10-CM | POA: Diagnosis not present

## 2021-02-18 DIAGNOSIS — E1165 Type 2 diabetes mellitus with hyperglycemia: Secondary | ICD-10-CM | POA: Diagnosis not present

## 2021-02-18 NOTE — Telephone Encounter (Signed)
Mitzi Hansen from [facility] call and stated he need verbal order for Physical therapist once a week for 6 wk's for strengthen on pt.

## 2021-02-19 NOTE — Telephone Encounter (Signed)
Okay for verbal orders? Please advise 

## 2021-02-19 NOTE — Telephone Encounter (Signed)
Left detailed message for return call back.

## 2021-02-19 NOTE — Telephone Encounter (Signed)
Left message to return phone call.

## 2021-02-20 DIAGNOSIS — L89322 Pressure ulcer of left buttock, stage 2: Secondary | ICD-10-CM | POA: Diagnosis not present

## 2021-02-20 DIAGNOSIS — Z7984 Long term (current) use of oral hypoglycemic drugs: Secondary | ICD-10-CM | POA: Diagnosis not present

## 2021-02-20 DIAGNOSIS — K5901 Slow transit constipation: Secondary | ICD-10-CM | POA: Diagnosis not present

## 2021-02-20 DIAGNOSIS — I69318 Other symptoms and signs involving cognitive functions following cerebral infarction: Secondary | ICD-10-CM | POA: Diagnosis not present

## 2021-02-20 DIAGNOSIS — E7849 Other hyperlipidemia: Secondary | ICD-10-CM | POA: Diagnosis not present

## 2021-02-20 DIAGNOSIS — J449 Chronic obstructive pulmonary disease, unspecified: Secondary | ICD-10-CM | POA: Diagnosis not present

## 2021-02-20 DIAGNOSIS — E1165 Type 2 diabetes mellitus with hyperglycemia: Secondary | ICD-10-CM | POA: Diagnosis not present

## 2021-02-20 DIAGNOSIS — R296 Repeated falls: Secondary | ICD-10-CM | POA: Diagnosis not present

## 2021-02-20 DIAGNOSIS — D696 Thrombocytopenia, unspecified: Secondary | ICD-10-CM | POA: Diagnosis not present

## 2021-02-20 DIAGNOSIS — W19XXXD Unspecified fall, subsequent encounter: Secondary | ICD-10-CM | POA: Diagnosis not present

## 2021-02-20 DIAGNOSIS — I10 Essential (primary) hypertension: Secondary | ICD-10-CM | POA: Diagnosis not present

## 2021-02-20 DIAGNOSIS — E876 Hypokalemia: Secondary | ICD-10-CM | POA: Diagnosis not present

## 2021-02-20 DIAGNOSIS — S42212D Unspecified displaced fracture of surgical neck of left humerus, subsequent encounter for fracture with routine healing: Secondary | ICD-10-CM | POA: Diagnosis not present

## 2021-02-20 NOTE — Telephone Encounter (Signed)
Verbal orders given to [place].

## 2021-02-20 NOTE — Telephone Encounter (Signed)
Left message to return phone call.

## 2021-02-21 DIAGNOSIS — S42212D Unspecified displaced fracture of surgical neck of left humerus, subsequent encounter for fracture with routine healing: Secondary | ICD-10-CM | POA: Diagnosis not present

## 2021-02-21 DIAGNOSIS — I10 Essential (primary) hypertension: Secondary | ICD-10-CM | POA: Diagnosis not present

## 2021-02-21 DIAGNOSIS — R296 Repeated falls: Secondary | ICD-10-CM | POA: Diagnosis not present

## 2021-02-21 DIAGNOSIS — W19XXXD Unspecified fall, subsequent encounter: Secondary | ICD-10-CM | POA: Diagnosis not present

## 2021-02-21 DIAGNOSIS — L89322 Pressure ulcer of left buttock, stage 2: Secondary | ICD-10-CM | POA: Diagnosis not present

## 2021-02-21 DIAGNOSIS — K5901 Slow transit constipation: Secondary | ICD-10-CM | POA: Diagnosis not present

## 2021-02-21 DIAGNOSIS — I69318 Other symptoms and signs involving cognitive functions following cerebral infarction: Secondary | ICD-10-CM | POA: Diagnosis not present

## 2021-02-21 DIAGNOSIS — E7849 Other hyperlipidemia: Secondary | ICD-10-CM | POA: Diagnosis not present

## 2021-02-21 DIAGNOSIS — J449 Chronic obstructive pulmonary disease, unspecified: Secondary | ICD-10-CM | POA: Diagnosis not present

## 2021-02-21 DIAGNOSIS — E876 Hypokalemia: Secondary | ICD-10-CM | POA: Diagnosis not present

## 2021-02-21 DIAGNOSIS — E1165 Type 2 diabetes mellitus with hyperglycemia: Secondary | ICD-10-CM | POA: Diagnosis not present

## 2021-02-21 DIAGNOSIS — Z7984 Long term (current) use of oral hypoglycemic drugs: Secondary | ICD-10-CM | POA: Diagnosis not present

## 2021-02-21 DIAGNOSIS — D696 Thrombocytopenia, unspecified: Secondary | ICD-10-CM | POA: Diagnosis not present

## 2021-02-25 DIAGNOSIS — I10 Essential (primary) hypertension: Secondary | ICD-10-CM | POA: Diagnosis not present

## 2021-02-25 DIAGNOSIS — W19XXXD Unspecified fall, subsequent encounter: Secondary | ICD-10-CM | POA: Diagnosis not present

## 2021-02-25 DIAGNOSIS — Z7984 Long term (current) use of oral hypoglycemic drugs: Secondary | ICD-10-CM | POA: Diagnosis not present

## 2021-02-25 DIAGNOSIS — E1165 Type 2 diabetes mellitus with hyperglycemia: Secondary | ICD-10-CM | POA: Diagnosis not present

## 2021-02-25 DIAGNOSIS — R296 Repeated falls: Secondary | ICD-10-CM | POA: Diagnosis not present

## 2021-02-25 DIAGNOSIS — S42212D Unspecified displaced fracture of surgical neck of left humerus, subsequent encounter for fracture with routine healing: Secondary | ICD-10-CM | POA: Diagnosis not present

## 2021-02-25 DIAGNOSIS — E876 Hypokalemia: Secondary | ICD-10-CM | POA: Diagnosis not present

## 2021-02-25 DIAGNOSIS — I69318 Other symptoms and signs involving cognitive functions following cerebral infarction: Secondary | ICD-10-CM | POA: Diagnosis not present

## 2021-02-25 DIAGNOSIS — E7849 Other hyperlipidemia: Secondary | ICD-10-CM | POA: Diagnosis not present

## 2021-02-25 DIAGNOSIS — K5901 Slow transit constipation: Secondary | ICD-10-CM | POA: Diagnosis not present

## 2021-02-25 DIAGNOSIS — L89322 Pressure ulcer of left buttock, stage 2: Secondary | ICD-10-CM | POA: Diagnosis not present

## 2021-02-25 DIAGNOSIS — D696 Thrombocytopenia, unspecified: Secondary | ICD-10-CM | POA: Diagnosis not present

## 2021-02-25 DIAGNOSIS — J449 Chronic obstructive pulmonary disease, unspecified: Secondary | ICD-10-CM | POA: Diagnosis not present

## 2021-02-26 DIAGNOSIS — D696 Thrombocytopenia, unspecified: Secondary | ICD-10-CM | POA: Diagnosis not present

## 2021-02-26 DIAGNOSIS — E1165 Type 2 diabetes mellitus with hyperglycemia: Secondary | ICD-10-CM | POA: Diagnosis not present

## 2021-02-26 DIAGNOSIS — I69318 Other symptoms and signs involving cognitive functions following cerebral infarction: Secondary | ICD-10-CM | POA: Diagnosis not present

## 2021-02-26 DIAGNOSIS — Z7984 Long term (current) use of oral hypoglycemic drugs: Secondary | ICD-10-CM | POA: Diagnosis not present

## 2021-02-26 DIAGNOSIS — J449 Chronic obstructive pulmonary disease, unspecified: Secondary | ICD-10-CM | POA: Diagnosis not present

## 2021-02-26 DIAGNOSIS — I10 Essential (primary) hypertension: Secondary | ICD-10-CM | POA: Diagnosis not present

## 2021-02-26 DIAGNOSIS — E876 Hypokalemia: Secondary | ICD-10-CM | POA: Diagnosis not present

## 2021-02-26 DIAGNOSIS — K5901 Slow transit constipation: Secondary | ICD-10-CM | POA: Diagnosis not present

## 2021-02-26 DIAGNOSIS — R296 Repeated falls: Secondary | ICD-10-CM | POA: Diagnosis not present

## 2021-02-26 DIAGNOSIS — W19XXXD Unspecified fall, subsequent encounter: Secondary | ICD-10-CM | POA: Diagnosis not present

## 2021-02-26 DIAGNOSIS — L89322 Pressure ulcer of left buttock, stage 2: Secondary | ICD-10-CM | POA: Diagnosis not present

## 2021-02-26 DIAGNOSIS — E7849 Other hyperlipidemia: Secondary | ICD-10-CM | POA: Diagnosis not present

## 2021-02-26 DIAGNOSIS — S42212D Unspecified displaced fracture of surgical neck of left humerus, subsequent encounter for fracture with routine healing: Secondary | ICD-10-CM | POA: Diagnosis not present

## 2021-02-28 DIAGNOSIS — E7849 Other hyperlipidemia: Secondary | ICD-10-CM | POA: Diagnosis not present

## 2021-02-28 DIAGNOSIS — W19XXXD Unspecified fall, subsequent encounter: Secondary | ICD-10-CM | POA: Diagnosis not present

## 2021-02-28 DIAGNOSIS — K5901 Slow transit constipation: Secondary | ICD-10-CM | POA: Diagnosis not present

## 2021-02-28 DIAGNOSIS — E876 Hypokalemia: Secondary | ICD-10-CM | POA: Diagnosis not present

## 2021-02-28 DIAGNOSIS — E1165 Type 2 diabetes mellitus with hyperglycemia: Secondary | ICD-10-CM | POA: Diagnosis not present

## 2021-02-28 DIAGNOSIS — Z7984 Long term (current) use of oral hypoglycemic drugs: Secondary | ICD-10-CM | POA: Diagnosis not present

## 2021-02-28 DIAGNOSIS — R296 Repeated falls: Secondary | ICD-10-CM | POA: Diagnosis not present

## 2021-02-28 DIAGNOSIS — I10 Essential (primary) hypertension: Secondary | ICD-10-CM | POA: Diagnosis not present

## 2021-02-28 DIAGNOSIS — S42212D Unspecified displaced fracture of surgical neck of left humerus, subsequent encounter for fracture with routine healing: Secondary | ICD-10-CM | POA: Diagnosis not present

## 2021-02-28 DIAGNOSIS — I69318 Other symptoms and signs involving cognitive functions following cerebral infarction: Secondary | ICD-10-CM | POA: Diagnosis not present

## 2021-02-28 DIAGNOSIS — L89322 Pressure ulcer of left buttock, stage 2: Secondary | ICD-10-CM | POA: Diagnosis not present

## 2021-02-28 DIAGNOSIS — D696 Thrombocytopenia, unspecified: Secondary | ICD-10-CM | POA: Diagnosis not present

## 2021-02-28 DIAGNOSIS — J449 Chronic obstructive pulmonary disease, unspecified: Secondary | ICD-10-CM | POA: Diagnosis not present

## 2021-03-04 DIAGNOSIS — W19XXXD Unspecified fall, subsequent encounter: Secondary | ICD-10-CM | POA: Diagnosis not present

## 2021-03-04 DIAGNOSIS — D696 Thrombocytopenia, unspecified: Secondary | ICD-10-CM | POA: Diagnosis not present

## 2021-03-04 DIAGNOSIS — I10 Essential (primary) hypertension: Secondary | ICD-10-CM | POA: Diagnosis not present

## 2021-03-04 DIAGNOSIS — K5901 Slow transit constipation: Secondary | ICD-10-CM | POA: Diagnosis not present

## 2021-03-04 DIAGNOSIS — E1165 Type 2 diabetes mellitus with hyperglycemia: Secondary | ICD-10-CM | POA: Diagnosis not present

## 2021-03-04 DIAGNOSIS — E7849 Other hyperlipidemia: Secondary | ICD-10-CM | POA: Diagnosis not present

## 2021-03-04 DIAGNOSIS — I69318 Other symptoms and signs involving cognitive functions following cerebral infarction: Secondary | ICD-10-CM | POA: Diagnosis not present

## 2021-03-04 DIAGNOSIS — S42212D Unspecified displaced fracture of surgical neck of left humerus, subsequent encounter for fracture with routine healing: Secondary | ICD-10-CM | POA: Diagnosis not present

## 2021-03-04 DIAGNOSIS — R296 Repeated falls: Secondary | ICD-10-CM | POA: Diagnosis not present

## 2021-03-04 DIAGNOSIS — E876 Hypokalemia: Secondary | ICD-10-CM | POA: Diagnosis not present

## 2021-03-04 DIAGNOSIS — L89322 Pressure ulcer of left buttock, stage 2: Secondary | ICD-10-CM | POA: Diagnosis not present

## 2021-03-04 DIAGNOSIS — Z7984 Long term (current) use of oral hypoglycemic drugs: Secondary | ICD-10-CM | POA: Diagnosis not present

## 2021-03-04 DIAGNOSIS — J449 Chronic obstructive pulmonary disease, unspecified: Secondary | ICD-10-CM | POA: Diagnosis not present

## 2021-03-06 DIAGNOSIS — E876 Hypokalemia: Secondary | ICD-10-CM | POA: Diagnosis not present

## 2021-03-06 DIAGNOSIS — I69318 Other symptoms and signs involving cognitive functions following cerebral infarction: Secondary | ICD-10-CM | POA: Diagnosis not present

## 2021-03-06 DIAGNOSIS — K5901 Slow transit constipation: Secondary | ICD-10-CM | POA: Diagnosis not present

## 2021-03-06 DIAGNOSIS — R296 Repeated falls: Secondary | ICD-10-CM | POA: Diagnosis not present

## 2021-03-06 DIAGNOSIS — W19XXXD Unspecified fall, subsequent encounter: Secondary | ICD-10-CM | POA: Diagnosis not present

## 2021-03-06 DIAGNOSIS — I10 Essential (primary) hypertension: Secondary | ICD-10-CM | POA: Diagnosis not present

## 2021-03-06 DIAGNOSIS — S42212D Unspecified displaced fracture of surgical neck of left humerus, subsequent encounter for fracture with routine healing: Secondary | ICD-10-CM | POA: Diagnosis not present

## 2021-03-06 DIAGNOSIS — L89322 Pressure ulcer of left buttock, stage 2: Secondary | ICD-10-CM | POA: Diagnosis not present

## 2021-03-06 DIAGNOSIS — Z7984 Long term (current) use of oral hypoglycemic drugs: Secondary | ICD-10-CM | POA: Diagnosis not present

## 2021-03-06 DIAGNOSIS — J449 Chronic obstructive pulmonary disease, unspecified: Secondary | ICD-10-CM | POA: Diagnosis not present

## 2021-03-06 DIAGNOSIS — D696 Thrombocytopenia, unspecified: Secondary | ICD-10-CM | POA: Diagnosis not present

## 2021-03-06 DIAGNOSIS — E7849 Other hyperlipidemia: Secondary | ICD-10-CM | POA: Diagnosis not present

## 2021-03-06 DIAGNOSIS — E1165 Type 2 diabetes mellitus with hyperglycemia: Secondary | ICD-10-CM | POA: Diagnosis not present

## 2021-03-15 DIAGNOSIS — M6281 Muscle weakness (generalized): Secondary | ICD-10-CM | POA: Diagnosis not present

## 2021-03-15 DIAGNOSIS — J449 Chronic obstructive pulmonary disease, unspecified: Secondary | ICD-10-CM | POA: Diagnosis not present

## 2021-03-20 ENCOUNTER — Telehealth: Payer: Self-pay | Admitting: Adult Health

## 2021-03-20 NOTE — Telephone Encounter (Signed)
Tried calling patient to schedule Medicare Annual Wellness Visit (AWV) either virtually or in office. Left  my Herbie Drape number [phone]  No answer @ home # Different name on cell phone voicemail did not leave message   Last AWV 03/05/15  please schedule at anytime with [facility] [place] [facility] or [facility]   This should be a 45 minute visit.

## 2021-03-22 ENCOUNTER — Telehealth: Payer: Self-pay

## 2021-03-22 ENCOUNTER — Encounter: Payer: Self-pay | Admitting: Adult Health

## 2021-03-22 NOTE — Telephone Encounter (Signed)
Drew Burke POA of patient called requesting orders be placed for: Hospital bed Home Health Nurse  Patient has multiple pressure sores  Call back # [phone]

## 2021-03-25 NOTE — Telephone Encounter (Signed)
Please advise 

## 2021-03-26 ENCOUNTER — Other Ambulatory Visit: Payer: Self-pay | Admitting: Adult Health

## 2021-03-26 DIAGNOSIS — L8990 Pressure ulcer of unspecified site, unspecified stage: Secondary | ICD-10-CM

## 2021-03-27 NOTE — Telephone Encounter (Signed)
See patient message from 03/22/21

## 2021-04-01 NOTE — Telephone Encounter (Signed)
PT PoA called to find out the status of the Hospital Bed order. Please advise as to the status and call the PoA back.

## 2021-04-05 DIAGNOSIS — E876 Hypokalemia: Secondary | ICD-10-CM | POA: Diagnosis not present

## 2021-04-05 DIAGNOSIS — R410 Disorientation, unspecified: Secondary | ICD-10-CM | POA: Diagnosis not present

## 2021-04-05 DIAGNOSIS — R0689 Other abnormalities of breathing: Secondary | ICD-10-CM | POA: Diagnosis not present

## 2021-04-05 DIAGNOSIS — E785 Hyperlipidemia, unspecified: Secondary | ICD-10-CM | POA: Diagnosis not present

## 2021-04-05 DIAGNOSIS — Z720 Tobacco use: Secondary | ICD-10-CM | POA: Diagnosis not present

## 2021-04-05 DIAGNOSIS — R17 Unspecified jaundice: Secondary | ICD-10-CM | POA: Diagnosis not present

## 2021-04-05 DIAGNOSIS — Z79899 Other long term (current) drug therapy: Secondary | ICD-10-CM | POA: Diagnosis not present

## 2021-04-05 DIAGNOSIS — I10 Essential (primary) hypertension: Secondary | ICD-10-CM | POA: Diagnosis not present

## 2021-04-05 DIAGNOSIS — R451 Restlessness and agitation: Secondary | ICD-10-CM | POA: Diagnosis not present

## 2021-04-05 DIAGNOSIS — E119 Type 2 diabetes mellitus without complications: Secondary | ICD-10-CM | POA: Diagnosis not present

## 2021-04-05 DIAGNOSIS — K828 Other specified diseases of gallbladder: Secondary | ICD-10-CM | POA: Diagnosis not present

## 2021-04-05 DIAGNOSIS — Z794 Long term (current) use of insulin: Secondary | ICD-10-CM | POA: Diagnosis not present

## 2021-04-05 DIAGNOSIS — Z7984 Long term (current) use of oral hypoglycemic drugs: Secondary | ICD-10-CM | POA: Diagnosis not present

## 2021-04-05 DIAGNOSIS — J449 Chronic obstructive pulmonary disease, unspecified: Secondary | ICD-10-CM | POA: Diagnosis not present

## 2021-04-05 DIAGNOSIS — E1165 Type 2 diabetes mellitus with hyperglycemia: Secondary | ICD-10-CM | POA: Diagnosis not present

## 2021-04-05 DIAGNOSIS — R300 Dysuria: Secondary | ICD-10-CM | POA: Diagnosis not present

## 2021-04-05 DIAGNOSIS — Z7982 Long term (current) use of aspirin: Secondary | ICD-10-CM | POA: Diagnosis not present

## 2021-04-05 DIAGNOSIS — D696 Thrombocytopenia, unspecified: Secondary | ICD-10-CM | POA: Diagnosis not present

## 2021-04-05 DIAGNOSIS — F17210 Nicotine dependence, cigarettes, uncomplicated: Secondary | ICD-10-CM | POA: Diagnosis not present

## 2021-04-05 DIAGNOSIS — Z743 Need for continuous supervision: Secondary | ICD-10-CM | POA: Diagnosis not present

## 2021-04-05 DIAGNOSIS — G9389 Other specified disorders of brain: Secondary | ICD-10-CM | POA: Diagnosis not present

## 2021-04-05 DIAGNOSIS — R6889 Other general symptoms and signs: Secondary | ICD-10-CM | POA: Diagnosis not present

## 2021-04-05 DIAGNOSIS — I499 Cardiac arrhythmia, unspecified: Secondary | ICD-10-CM | POA: Diagnosis not present

## 2021-04-05 DIAGNOSIS — A419 Sepsis, unspecified organism: Secondary | ICD-10-CM | POA: Diagnosis not present

## 2021-04-05 DIAGNOSIS — N390 Urinary tract infection, site not specified: Secondary | ICD-10-CM | POA: Diagnosis not present

## 2021-04-05 DIAGNOSIS — R531 Weakness: Secondary | ICD-10-CM | POA: Diagnosis not present

## 2021-04-05 DIAGNOSIS — R5383 Other fatigue: Secondary | ICD-10-CM | POA: Diagnosis not present

## 2021-04-05 DIAGNOSIS — Z8673 Personal history of transient ischemic attack (TIA), and cerebral infarction without residual deficits: Secondary | ICD-10-CM | POA: Diagnosis not present

## 2021-04-06 DIAGNOSIS — R451 Restlessness and agitation: Secondary | ICD-10-CM | POA: Diagnosis not present

## 2021-04-06 DIAGNOSIS — Z7982 Long term (current) use of aspirin: Secondary | ICD-10-CM | POA: Diagnosis not present

## 2021-04-06 DIAGNOSIS — R410 Disorientation, unspecified: Secondary | ICD-10-CM | POA: Diagnosis not present

## 2021-04-06 DIAGNOSIS — Z7984 Long term (current) use of oral hypoglycemic drugs: Secondary | ICD-10-CM | POA: Diagnosis not present

## 2021-04-06 DIAGNOSIS — E119 Type 2 diabetes mellitus without complications: Secondary | ICD-10-CM | POA: Diagnosis not present

## 2021-04-06 DIAGNOSIS — J449 Chronic obstructive pulmonary disease, unspecified: Secondary | ICD-10-CM | POA: Diagnosis not present

## 2021-04-06 DIAGNOSIS — I10 Essential (primary) hypertension: Secondary | ICD-10-CM | POA: Diagnosis not present

## 2021-04-06 DIAGNOSIS — F17210 Nicotine dependence, cigarettes, uncomplicated: Secondary | ICD-10-CM | POA: Diagnosis not present

## 2021-04-06 DIAGNOSIS — E876 Hypokalemia: Secondary | ICD-10-CM | POA: Diagnosis not present

## 2021-04-06 DIAGNOSIS — Z79899 Other long term (current) drug therapy: Secondary | ICD-10-CM | POA: Diagnosis not present

## 2021-04-06 DIAGNOSIS — Z8673 Personal history of transient ischemic attack (TIA), and cerebral infarction without residual deficits: Secondary | ICD-10-CM | POA: Diagnosis not present

## 2021-04-06 DIAGNOSIS — A419 Sepsis, unspecified organism: Secondary | ICD-10-CM | POA: Diagnosis not present

## 2021-04-06 DIAGNOSIS — E785 Hyperlipidemia, unspecified: Secondary | ICD-10-CM | POA: Diagnosis not present

## 2021-04-06 DIAGNOSIS — N390 Urinary tract infection, site not specified: Secondary | ICD-10-CM | POA: Diagnosis not present

## 2021-04-06 DIAGNOSIS — Z794 Long term (current) use of insulin: Secondary | ICD-10-CM | POA: Diagnosis not present

## 2021-04-06 DIAGNOSIS — D696 Thrombocytopenia, unspecified: Secondary | ICD-10-CM | POA: Diagnosis not present

## 2021-04-06 DIAGNOSIS — Z720 Tobacco use: Secondary | ICD-10-CM | POA: Diagnosis not present

## 2021-04-07 DIAGNOSIS — Z8673 Personal history of transient ischemic attack (TIA), and cerebral infarction without residual deficits: Secondary | ICD-10-CM | POA: Diagnosis not present

## 2021-04-07 DIAGNOSIS — E785 Hyperlipidemia, unspecified: Secondary | ICD-10-CM | POA: Diagnosis not present

## 2021-04-07 DIAGNOSIS — E119 Type 2 diabetes mellitus without complications: Secondary | ICD-10-CM | POA: Diagnosis not present

## 2021-04-07 DIAGNOSIS — Z720 Tobacco use: Secondary | ICD-10-CM | POA: Diagnosis not present

## 2021-04-07 DIAGNOSIS — I10 Essential (primary) hypertension: Secondary | ICD-10-CM | POA: Diagnosis not present

## 2021-04-07 DIAGNOSIS — F17210 Nicotine dependence, cigarettes, uncomplicated: Secondary | ICD-10-CM | POA: Diagnosis not present

## 2021-04-07 DIAGNOSIS — J449 Chronic obstructive pulmonary disease, unspecified: Secondary | ICD-10-CM | POA: Diagnosis not present

## 2021-04-07 DIAGNOSIS — D696 Thrombocytopenia, unspecified: Secondary | ICD-10-CM | POA: Diagnosis not present

## 2021-04-07 DIAGNOSIS — A419 Sepsis, unspecified organism: Secondary | ICD-10-CM | POA: Diagnosis not present

## 2021-04-07 DIAGNOSIS — R451 Restlessness and agitation: Secondary | ICD-10-CM | POA: Diagnosis not present

## 2021-04-07 DIAGNOSIS — R197 Diarrhea, unspecified: Secondary | ICD-10-CM | POA: Diagnosis not present

## 2021-04-07 DIAGNOSIS — Z7984 Long term (current) use of oral hypoglycemic drugs: Secondary | ICD-10-CM | POA: Diagnosis not present

## 2021-04-07 DIAGNOSIS — Z794 Long term (current) use of insulin: Secondary | ICD-10-CM | POA: Diagnosis not present

## 2021-04-07 DIAGNOSIS — Z79899 Other long term (current) drug therapy: Secondary | ICD-10-CM | POA: Diagnosis not present

## 2021-04-07 DIAGNOSIS — E876 Hypokalemia: Secondary | ICD-10-CM | POA: Diagnosis not present

## 2021-04-07 DIAGNOSIS — Z7982 Long term (current) use of aspirin: Secondary | ICD-10-CM | POA: Diagnosis not present

## 2021-04-07 DIAGNOSIS — N390 Urinary tract infection, site not specified: Secondary | ICD-10-CM | POA: Diagnosis not present

## 2021-04-07 DIAGNOSIS — R410 Disorientation, unspecified: Secondary | ICD-10-CM | POA: Diagnosis not present

## 2021-04-08 DIAGNOSIS — I10 Essential (primary) hypertension: Secondary | ICD-10-CM | POA: Diagnosis not present

## 2021-04-08 DIAGNOSIS — D696 Thrombocytopenia, unspecified: Secondary | ICD-10-CM | POA: Diagnosis not present

## 2021-04-08 DIAGNOSIS — Z7984 Long term (current) use of oral hypoglycemic drugs: Secondary | ICD-10-CM | POA: Diagnosis not present

## 2021-04-08 DIAGNOSIS — Z79899 Other long term (current) drug therapy: Secondary | ICD-10-CM | POA: Diagnosis not present

## 2021-04-08 DIAGNOSIS — E119 Type 2 diabetes mellitus without complications: Secondary | ICD-10-CM | POA: Diagnosis not present

## 2021-04-08 DIAGNOSIS — A419 Sepsis, unspecified organism: Secondary | ICD-10-CM | POA: Diagnosis not present

## 2021-04-08 DIAGNOSIS — Z8673 Personal history of transient ischemic attack (TIA), and cerebral infarction without residual deficits: Secondary | ICD-10-CM | POA: Diagnosis not present

## 2021-04-08 DIAGNOSIS — R451 Restlessness and agitation: Secondary | ICD-10-CM | POA: Diagnosis not present

## 2021-04-08 DIAGNOSIS — R410 Disorientation, unspecified: Secondary | ICD-10-CM | POA: Diagnosis not present

## 2021-04-08 DIAGNOSIS — E785 Hyperlipidemia, unspecified: Secondary | ICD-10-CM | POA: Diagnosis not present

## 2021-04-08 DIAGNOSIS — J449 Chronic obstructive pulmonary disease, unspecified: Secondary | ICD-10-CM | POA: Diagnosis not present

## 2021-04-08 DIAGNOSIS — Z794 Long term (current) use of insulin: Secondary | ICD-10-CM | POA: Diagnosis not present

## 2021-04-08 DIAGNOSIS — E876 Hypokalemia: Secondary | ICD-10-CM | POA: Diagnosis not present

## 2021-04-08 DIAGNOSIS — N390 Urinary tract infection, site not specified: Secondary | ICD-10-CM | POA: Diagnosis not present

## 2021-04-08 DIAGNOSIS — F17210 Nicotine dependence, cigarettes, uncomplicated: Secondary | ICD-10-CM | POA: Diagnosis not present

## 2021-04-08 DIAGNOSIS — Z7982 Long term (current) use of aspirin: Secondary | ICD-10-CM | POA: Diagnosis not present

## 2021-04-08 DIAGNOSIS — Z720 Tobacco use: Secondary | ICD-10-CM | POA: Diagnosis not present

## 2021-04-08 DIAGNOSIS — R197 Diarrhea, unspecified: Secondary | ICD-10-CM | POA: Diagnosis not present

## 2021-04-09 DIAGNOSIS — R410 Disorientation, unspecified: Secondary | ICD-10-CM | POA: Diagnosis not present

## 2021-04-09 DIAGNOSIS — D696 Thrombocytopenia, unspecified: Secondary | ICD-10-CM | POA: Diagnosis not present

## 2021-04-09 DIAGNOSIS — E1165 Type 2 diabetes mellitus with hyperglycemia: Secondary | ICD-10-CM | POA: Diagnosis not present

## 2021-04-09 DIAGNOSIS — I10 Essential (primary) hypertension: Secondary | ICD-10-CM | POA: Diagnosis not present

## 2021-04-09 DIAGNOSIS — Z79899 Other long term (current) drug therapy: Secondary | ICD-10-CM | POA: Diagnosis not present

## 2021-04-09 DIAGNOSIS — N390 Urinary tract infection, site not specified: Secondary | ICD-10-CM | POA: Diagnosis not present

## 2021-04-09 DIAGNOSIS — E785 Hyperlipidemia, unspecified: Secondary | ICD-10-CM | POA: Diagnosis not present

## 2021-04-09 DIAGNOSIS — A419 Sepsis, unspecified organism: Secondary | ICD-10-CM | POA: Diagnosis not present

## 2021-04-09 DIAGNOSIS — E119 Type 2 diabetes mellitus without complications: Secondary | ICD-10-CM | POA: Diagnosis not present

## 2021-04-09 DIAGNOSIS — Z8673 Personal history of transient ischemic attack (TIA), and cerebral infarction without residual deficits: Secondary | ICD-10-CM | POA: Diagnosis not present

## 2021-04-09 DIAGNOSIS — Z720 Tobacco use: Secondary | ICD-10-CM | POA: Diagnosis not present

## 2021-04-09 DIAGNOSIS — J449 Chronic obstructive pulmonary disease, unspecified: Secondary | ICD-10-CM | POA: Diagnosis not present

## 2021-04-09 DIAGNOSIS — F17210 Nicotine dependence, cigarettes, uncomplicated: Secondary | ICD-10-CM | POA: Diagnosis not present

## 2021-04-09 DIAGNOSIS — Z794 Long term (current) use of insulin: Secondary | ICD-10-CM | POA: Diagnosis not present

## 2021-04-09 DIAGNOSIS — Z7984 Long term (current) use of oral hypoglycemic drugs: Secondary | ICD-10-CM | POA: Diagnosis not present

## 2021-04-09 DIAGNOSIS — E876 Hypokalemia: Secondary | ICD-10-CM | POA: Diagnosis not present

## 2021-04-09 DIAGNOSIS — R451 Restlessness and agitation: Secondary | ICD-10-CM | POA: Diagnosis not present

## 2021-04-09 DIAGNOSIS — Z7982 Long term (current) use of aspirin: Secondary | ICD-10-CM | POA: Diagnosis not present

## 2021-04-10 DIAGNOSIS — R410 Disorientation, unspecified: Secondary | ICD-10-CM | POA: Diagnosis not present

## 2021-04-10 DIAGNOSIS — R451 Restlessness and agitation: Secondary | ICD-10-CM | POA: Diagnosis not present

## 2021-04-10 DIAGNOSIS — N390 Urinary tract infection, site not specified: Secondary | ICD-10-CM | POA: Diagnosis not present

## 2021-04-10 DIAGNOSIS — R6889 Other general symptoms and signs: Secondary | ICD-10-CM | POA: Diagnosis not present

## 2021-04-10 DIAGNOSIS — E119 Type 2 diabetes mellitus without complications: Secondary | ICD-10-CM | POA: Diagnosis not present

## 2021-04-10 DIAGNOSIS — I10 Essential (primary) hypertension: Secondary | ICD-10-CM | POA: Diagnosis not present

## 2021-04-10 DIAGNOSIS — A419 Sepsis, unspecified organism: Secondary | ICD-10-CM | POA: Diagnosis not present

## 2021-04-10 DIAGNOSIS — E785 Hyperlipidemia, unspecified: Secondary | ICD-10-CM | POA: Diagnosis not present

## 2021-04-10 DIAGNOSIS — E1165 Type 2 diabetes mellitus with hyperglycemia: Secondary | ICD-10-CM | POA: Diagnosis not present

## 2021-04-10 DIAGNOSIS — Z7984 Long term (current) use of oral hypoglycemic drugs: Secondary | ICD-10-CM | POA: Diagnosis not present

## 2021-04-10 DIAGNOSIS — Z8673 Personal history of transient ischemic attack (TIA), and cerebral infarction without residual deficits: Secondary | ICD-10-CM | POA: Diagnosis not present

## 2021-04-10 DIAGNOSIS — Z743 Need for continuous supervision: Secondary | ICD-10-CM | POA: Diagnosis not present

## 2021-04-10 DIAGNOSIS — Z79899 Other long term (current) drug therapy: Secondary | ICD-10-CM | POA: Diagnosis not present

## 2021-04-10 DIAGNOSIS — Z720 Tobacco use: Secondary | ICD-10-CM | POA: Diagnosis not present

## 2021-04-10 DIAGNOSIS — F17210 Nicotine dependence, cigarettes, uncomplicated: Secondary | ICD-10-CM | POA: Diagnosis not present

## 2021-04-10 DIAGNOSIS — D696 Thrombocytopenia, unspecified: Secondary | ICD-10-CM | POA: Diagnosis not present

## 2021-04-10 DIAGNOSIS — E876 Hypokalemia: Secondary | ICD-10-CM | POA: Diagnosis not present

## 2021-04-10 DIAGNOSIS — R531 Weakness: Secondary | ICD-10-CM | POA: Diagnosis not present

## 2021-04-10 DIAGNOSIS — J449 Chronic obstructive pulmonary disease, unspecified: Secondary | ICD-10-CM | POA: Diagnosis not present

## 2021-04-10 DIAGNOSIS — Z7982 Long term (current) use of aspirin: Secondary | ICD-10-CM | POA: Diagnosis not present

## 2021-04-10 DIAGNOSIS — Z794 Long term (current) use of insulin: Secondary | ICD-10-CM | POA: Diagnosis not present

## 2021-04-14 DIAGNOSIS — J449 Chronic obstructive pulmonary disease, unspecified: Secondary | ICD-10-CM | POA: Diagnosis not present

## 2021-04-14 DIAGNOSIS — M6281 Muscle weakness (generalized): Secondary | ICD-10-CM | POA: Diagnosis not present

## 2021-05-01 ENCOUNTER — Telehealth: Payer: Self-pay | Admitting: Adult Health

## 2021-05-01 ENCOUNTER — Other Ambulatory Visit: Payer: Self-pay | Admitting: Adult Health

## 2021-05-01 DIAGNOSIS — R627 Adult failure to thrive: Secondary | ICD-10-CM

## 2021-05-01 NOTE — Telephone Encounter (Signed)
Noted! [place] notified of update and stated she was able to see it in [employer]

## 2021-05-01 NOTE — Telephone Encounter (Signed)
Family is requesting Hospice services.  Needs order of this.  Needed--follow patient throughout his care.

## 2021-05-15 DIAGNOSIS — M6281 Muscle weakness (generalized): Secondary | ICD-10-CM | POA: Diagnosis not present

## 2021-05-15 DIAGNOSIS — J449 Chronic obstructive pulmonary disease, unspecified: Secondary | ICD-10-CM | POA: Diagnosis not present

## 2021-05-22 ENCOUNTER — Telehealth: Payer: Self-pay

## 2021-05-23 NOTE — Telephone Encounter (Signed)
Message routed to PCP.

## 2021-06-06 NOTE — Telephone Encounter (Signed)
Nurse called to report patient passed away at 2:18pm   06-07-2021

## 2021-06-06 DEATH — deceased
